# Patient Record
Sex: Female | Born: 1960 | Race: Asian | Hispanic: No | Marital: Married | Smoking: Never smoker
Health system: Southern US, Community
[De-identification: ages and names within clinical notes are randomized; demographics above are authoritative.]

## PROBLEM LIST (undated history)

## (undated) DIAGNOSIS — M546 Pain in thoracic spine: Secondary | ICD-10-CM

## (undated) DIAGNOSIS — R7303 Prediabetes: Secondary | ICD-10-CM

## (undated) DIAGNOSIS — G4733 Obstructive sleep apnea (adult) (pediatric): Secondary | ICD-10-CM

## (undated) DIAGNOSIS — G4730 Sleep apnea, unspecified: Secondary | ICD-10-CM

## (undated) DIAGNOSIS — N644 Mastodynia: Secondary | ICD-10-CM

## (undated) DIAGNOSIS — I10 Essential (primary) hypertension: Secondary | ICD-10-CM

## (undated) DIAGNOSIS — K219 Gastro-esophageal reflux disease without esophagitis: Secondary | ICD-10-CM

## (undated) DIAGNOSIS — E785 Hyperlipidemia, unspecified: Secondary | ICD-10-CM

## (undated) DIAGNOSIS — K760 Fatty (change of) liver, not elsewhere classified: Secondary | ICD-10-CM

## (undated) DIAGNOSIS — E663 Overweight: Secondary | ICD-10-CM

## (undated) HISTORY — DX: Mastodynia: N64.4

## (undated) HISTORY — DX: Fatty (change of) liver, not elsewhere classified: K76.0

## (undated) HISTORY — DX: Overweight: E66.3

## (undated) HISTORY — PX: BREAST CYST ASPIRATION: SHX578

## (undated) HISTORY — DX: Obstructive sleep apnea (adult) (pediatric): G47.33

## (undated) HISTORY — DX: Gastro-esophageal reflux disease without esophagitis: K21.9

## (undated) HISTORY — DX: Pain in thoracic spine: M54.6

## (undated) HISTORY — DX: Sleep apnea, unspecified: G47.30

## (undated) HISTORY — DX: Prediabetes: R73.03

## (undated) HISTORY — DX: Essential (primary) hypertension: I10

## (undated) HISTORY — DX: Hyperlipidemia, unspecified: E78.5

## (undated) HISTORY — PX: TUBAL LIGATION: SHX77

---

## 1997-09-26 HISTORY — PX: BREAST BIOPSY: SHX20

## 1999-04-19 ENCOUNTER — Other Ambulatory Visit: Admission: RE | Admit: 1999-04-19 | Discharge: 1999-04-19 | Payer: Self-pay | Admitting: Gynecology

## 1999-05-13 ENCOUNTER — Ambulatory Visit (HOSPITAL_BASED_OUTPATIENT_CLINIC_OR_DEPARTMENT_OTHER): Admission: RE | Admit: 1999-05-13 | Discharge: 1999-05-13 | Payer: Self-pay

## 2000-01-31 ENCOUNTER — Other Ambulatory Visit: Admission: RE | Admit: 2000-01-31 | Discharge: 2000-01-31 | Payer: Self-pay | Admitting: Gynecology

## 2001-04-03 ENCOUNTER — Encounter: Admission: RE | Admit: 2001-04-03 | Discharge: 2001-04-03 | Payer: Self-pay | Admitting: Gynecology

## 2001-04-03 ENCOUNTER — Other Ambulatory Visit: Admission: RE | Admit: 2001-04-03 | Discharge: 2001-04-03 | Payer: Self-pay | Admitting: Gynecology

## 2001-04-03 ENCOUNTER — Encounter: Payer: Self-pay | Admitting: Gynecology

## 2013-09-23 ENCOUNTER — Ambulatory Visit: Payer: Self-pay | Admitting: Family Medicine

## 2014-01-20 ENCOUNTER — Ambulatory Visit: Payer: Self-pay | Admitting: Family Medicine

## 2014-04-08 ENCOUNTER — Ambulatory Visit: Payer: Self-pay | Admitting: Family Medicine

## 2014-05-19 ENCOUNTER — Ambulatory Visit: Payer: Self-pay | Admitting: Family Medicine

## 2014-05-19 ENCOUNTER — Encounter: Payer: Self-pay | Admitting: *Deleted

## 2014-05-27 ENCOUNTER — Encounter: Payer: Self-pay | Admitting: Cardiovascular Disease

## 2014-05-27 ENCOUNTER — Encounter (INDEPENDENT_AMBULATORY_CARE_PROVIDER_SITE_OTHER): Payer: Self-pay

## 2014-05-27 ENCOUNTER — Ambulatory Visit (INDEPENDENT_AMBULATORY_CARE_PROVIDER_SITE_OTHER): Payer: BC Managed Care – PPO | Admitting: Cardiovascular Disease

## 2014-05-27 VITALS — BP 120/98 | HR 85 | Ht 62.0 in | Wt 173.0 lb

## 2014-05-27 DIAGNOSIS — R0602 Shortness of breath: Secondary | ICD-10-CM | POA: Insufficient documentation

## 2014-05-27 DIAGNOSIS — I10 Essential (primary) hypertension: Secondary | ICD-10-CM

## 2014-05-27 NOTE — Assessment & Plan Note (Signed)
Blood pressure is reasonably controlled on current medications. 

## 2014-05-27 NOTE — Progress Notes (Signed)
Primary care physician: Dr. Nadine Counts  HPI  This is a 53 year old female who is referred for evaluation of exertional dyspnea. She has no previous cardiac history. She has known history of hypertension and hyperlipidemia. No diabetes or tobacco use. There is no family history of premature coronary artery disease. Recently, she was helping her daughter move to college. She was helping her carrying stuff and was going upstairs. She had significant exertional dyspnea with palpitations without chest discomfort which is somewhat unusual for her. No orthopnea, PND or lower extremity edema. She does not exercise on a regular basis.  Allergies  Allergen Reactions  . Ciprofloxacin   . Lisinopril   . Migraine Formula [Aspirin-Acetaminophen-Caffeine]      Current Outpatient Prescriptions on File Prior to Visit  Medication Sig Dispense Refill  . aspirin 81 MG tablet Take 81 mg by mouth daily.      . cyclobenzaprine (FLEXERIL) 5 MG tablet Take 5-10 mg by mouth as needed for muscle spasms.      Marland Kitchen ibuprofen (ADVIL,MOTRIN) 800 MG tablet Take 800 mg by mouth every 8 (eight) hours as needed.      Marland Kitchen losartan-hydrochlorothiazide (HYZAAR) 100-12.5 MG per tablet Take 1 tablet by mouth daily.      . meloxicam (MOBIC) 15 MG tablet Take 15 mg by mouth daily.      Marland Kitchen omega-3 acid ethyl esters (LOVAZA) 1 G capsule Take 1 g by mouth daily.      Marland Kitchen omeprazole (PRILOSEC) 20 MG capsule Take 20 mg by mouth daily.      . simvastatin (ZOCOR) 40 MG tablet Take 40 mg by mouth daily.      . TURMERIC PO Take by mouth daily.       No current facility-administered medications on file prior to visit.     Past Medical History  Diagnosis Date  . Morbid obesity   . Esophageal reflux   . Postmenopausal bleeding   . Other and unspecified hyperlipidemia   . Unspecified essential hypertension   . Obstructive sleep apnea (adult) (pediatric)   . Sprain of ankle, unspecified site   . Edema   . Unspecified sinusitis (chronic)     . Pain in thoracic spine   . Mastodynia   . Unspecified sleep apnea   . Abdominal pain, right upper quadrant      Past Surgical History  Procedure Laterality Date  . Tubal ligation    . Breast biopsy       Family History  Problem Relation Age of Onset  . Family history unknown: Yes     History   Social History  . Marital Status: Married    Spouse Name: N/A    Number of Children: N/A  . Years of Education: N/A   Occupational History  . Not on file.   Social History Main Topics  . Smoking status: Never Smoker   . Smokeless tobacco: Not on file  . Alcohol Use: Yes     Comment: social  . Drug Use: No  . Sexual Activity: Not on file   Other Topics Concern  . Not on file   Social History Narrative  . No narrative on file     ROS A 10 point review of system was performed. It is negative other than that mentioned in the history of present illness.   PHYSICAL EXAM   BP 120/98  Pulse 85  Ht 5\' 2"  (1.575 m)  Wt 173 lb (78.472 kg)  BMI 31.63 kg/m2 Constitutional: She  is oriented to person, place, and time. She appears well-developed and well-nourished. No distress.  HENT: No nasal discharge.  Head: Normocephalic and atraumatic.  Eyes: Pupils are equal and round. No discharge.  Neck: Normal range of motion. Neck supple. No JVD present. No thyromegaly present.  Cardiovascular: Normal rate, regular rhythm, normal heart sounds. Exam reveals no gallop and no friction rub. No murmur heard.  Pulmonary/Chest: Effort normal and breath sounds normal. No stridor. No respiratory distress. She has no wheezes. She has no rales. She exhibits no tenderness.  Abdominal: Soft. Bowel sounds are normal. She exhibits no distension. There is no tenderness. There is no rebound and no guarding.  Musculoskeletal: Normal range of motion. She exhibits no edema and no tenderness.  Neurological: She is alert and oriented to person, place, and time. Coordination normal.  Skin: Skin is  warm and dry. No rash noted. She is not diaphoretic. No erythema. No pallor.  Psychiatric: She has a normal mood and affect. Her behavior is normal. Judgment and thought content normal.     [id number]  Rhythm  Low voltage in precordial leads.   -  Nonspecific T-abnormality.   ABNORMAL    ASSESSMENT AND PLAN

## 2014-05-27 NOTE — Patient Instructions (Addendum)
Your physician has requested that you have a stress echocardiogram. For further information please visit [email or web address]. Please follow instruction sheet as given. -eat a light meal  -wear comfortable clothing  -wear tennis shoes  - you can take your medication   Your physician recommends that you schedule a follow-up appointment in:  As needed with Dr. Fletcher Anon

## 2014-05-27 NOTE — Assessment & Plan Note (Addendum)
The patient has recent exertional dyspnea without chest discomfort. No signs of congestive heart failure. We should exclude angina equivalent. Thus, I requested a stress echocardiogram. Cardiac exam is normal. Baseline ECG is slightly abnormal with nonspecific T wave changes. The echocardiogram portion will examining her ejection fraction as well given that her symptoms include dyspnea .  The other  possibly etiology of her dyspnea could be physical deconditioning.

## 2014-06-13 ENCOUNTER — Telehealth: Payer: Self-pay | Admitting: *Deleted

## 2014-06-13 DIAGNOSIS — R0602 Shortness of breath: Secondary | ICD-10-CM

## 2014-06-13 NOTE — Telephone Encounter (Signed)
Message copied by Tracie Harrier on Fri Jun 13, 2014 10:40 AM ------      Message from: Benancio Deeds      Created: Fri Jun 13, 2014  7:35 AM      Regarding: echo       Happy Friday!            Can you change these orders to a 2D echo and GXT please?            Thank you!            Caryl Pina            ----- Message -----         From: Wellington Hampshire, MD         Sent: 06/12/2014   5:05 PM           To: Benancio Deeds            Yes change to Echo and GXT. Thanks.             ----- Message -----         From: Benancio Deeds         Sent: 06/11/2014  11:15 AM           To: Tracie Harrier, RN, Wellington Hampshire, MD            [facility] cross is not approving stress echo due to the patient's ability to walk on a treadmill.             Do you want to change this to a 2D echo and plain GXT?            You can call for an MD review if you would still like the stress echo at [phone] option , option 1 then option 2.                  Thank you,            Caryl Pina                   ------

## 2014-06-19 ENCOUNTER — Other Ambulatory Visit: Payer: BC Managed Care – PPO

## 2014-06-20 ENCOUNTER — Other Ambulatory Visit (INDEPENDENT_AMBULATORY_CARE_PROVIDER_SITE_OTHER): Payer: BC Managed Care – PPO

## 2014-06-20 ENCOUNTER — Ambulatory Visit (INDEPENDENT_AMBULATORY_CARE_PROVIDER_SITE_OTHER): Payer: BC Managed Care – PPO | Admitting: Cardiovascular Disease

## 2014-06-20 ENCOUNTER — Other Ambulatory Visit: Payer: Self-pay

## 2014-06-20 VITALS — BP 117/84 | HR 77 | Ht 62.0 in | Wt 171.0 lb

## 2014-06-20 DIAGNOSIS — R0602 Shortness of breath: Secondary | ICD-10-CM

## 2014-06-20 NOTE — Patient Instructions (Signed)
Normal stress test

## 2014-06-20 NOTE — Procedures (Signed)
   Treadmill Stress test  Indication: Shortness of breath.  Baseline Data:  Resting EKG shows NSR with rate of  77 bpm, no significant ST or T wave changes. Resting blood pressure of 117/84 mm Hg Stand bruce protocal was used.  Exercise Data:  Patient exercised for 6 min 0 sec,  Peak heart rate of 153 bpm.  This was 90 % of the maximum predicted heart rate. No symptoms of chest pain or lightheadedness were reported at peak stress or in recovery.  Peak Blood pressure recorded was 145/98 Maximal work level: 7 METs.  Heart rate at 3 minutes in recovery was 100 bpm. BP response: Normal HR response: Normal  EKG with Exercise: Sinus tachycardia with no significant ST changes.  FINAL IMPRESSION: Normal exercise stress test. No significant EKG changes concerning for ischemia. Average exercise tolerance.  Recommendation: Proceed with an echocardiogram.

## 2014-06-23 NOTE — Progress Notes (Signed)
GXT  

## 2014-09-16 ENCOUNTER — Ambulatory Visit: Payer: Self-pay | Admitting: Family Medicine

## 2014-09-19 ENCOUNTER — Emergency Department: Payer: Self-pay | Admitting: Internal Medicine

## 2014-10-21 ENCOUNTER — Ambulatory Visit: Payer: Self-pay | Admitting: Family Medicine

## 2015-01-27 ENCOUNTER — Ambulatory Visit
Admission: RE | Admit: 2015-01-27 | Discharge: 2015-01-27 | Disposition: A | Discharge status: Home / Self Care | Payer: BLUE CROSS/BLUE SHIELD | Source: Ambulatory Visit | Attending: Family Medicine | Admitting: Family Medicine

## 2015-01-27 ENCOUNTER — Other Ambulatory Visit: Payer: Self-pay | Admitting: Family Medicine

## 2015-01-27 DIAGNOSIS — M79605 Pain in left leg: Secondary | ICD-10-CM

## 2015-01-27 DIAGNOSIS — R609 Edema, unspecified: Secondary | ICD-10-CM

## 2015-01-27 DIAGNOSIS — M7989 Other specified soft tissue disorders: Secondary | ICD-10-CM | POA: Diagnosis present

## 2015-01-28 ENCOUNTER — Other Ambulatory Visit: Payer: Self-pay | Admitting: Family Medicine

## 2015-01-28 DIAGNOSIS — M7989 Other specified soft tissue disorders: Secondary | ICD-10-CM

## 2015-03-21 ENCOUNTER — Other Ambulatory Visit: Payer: Self-pay | Admitting: Family Medicine

## 2015-03-21 DIAGNOSIS — E785 Hyperlipidemia, unspecified: Secondary | ICD-10-CM

## 2015-03-25 ENCOUNTER — Other Ambulatory Visit: Payer: Self-pay | Admitting: Family Medicine

## 2015-03-25 DIAGNOSIS — I872 Venous insufficiency (chronic) (peripheral): Secondary | ICD-10-CM

## 2015-04-09 ENCOUNTER — Other Ambulatory Visit: Payer: Self-pay | Admitting: Family Medicine

## 2015-04-09 DIAGNOSIS — J3089 Other allergic rhinitis: Secondary | ICD-10-CM

## 2015-04-28 ENCOUNTER — Encounter: Payer: Self-pay | Admitting: Emergency Medicine

## 2015-04-28 ENCOUNTER — Emergency Department: Payer: Worker's Compensation

## 2015-04-28 ENCOUNTER — Emergency Department
Admission: EM | Admit: 2015-04-28 | Discharge: 2015-04-28 | Disposition: A | Discharge status: Home / Self Care | Payer: Worker's Compensation | Attending: Emergency Medicine | Admitting: Emergency Medicine

## 2015-04-28 DIAGNOSIS — Y9289 Other specified places as the place of occurrence of the external cause: Secondary | ICD-10-CM | POA: Insufficient documentation

## 2015-04-28 DIAGNOSIS — S060X0A Concussion without loss of consciousness, initial encounter: Secondary | ICD-10-CM | POA: Diagnosis not present

## 2015-04-28 DIAGNOSIS — Z7982 Long term (current) use of aspirin: Secondary | ICD-10-CM | POA: Insufficient documentation

## 2015-04-28 DIAGNOSIS — Y990 Civilian activity done for income or pay: Secondary | ICD-10-CM | POA: Diagnosis not present

## 2015-04-28 DIAGNOSIS — Y9389 Activity, other specified: Secondary | ICD-10-CM | POA: Insufficient documentation

## 2015-04-28 DIAGNOSIS — W01198A Fall on same level from slipping, tripping and stumbling with subsequent striking against other object, initial encounter: Secondary | ICD-10-CM | POA: Diagnosis not present

## 2015-04-28 DIAGNOSIS — Z79899 Other long term (current) drug therapy: Secondary | ICD-10-CM | POA: Diagnosis not present

## 2015-04-28 DIAGNOSIS — S0990XA Unspecified injury of head, initial encounter: Secondary | ICD-10-CM | POA: Diagnosis present

## 2015-04-28 MED ORDER — OXYCODONE-ACETAMINOPHEN 5-325 MG PO TABS
1.0000 | ORAL_TABLET | Freq: Once | ORAL | Status: AC
  Administered 2015-04-28: 1 via ORAL
  Filled 2015-04-28: qty 1

## 2015-04-28 NOTE — Discharge Instructions (Signed)
Concussion  A concussion, or closed-head injury, is a brain injury caused by a direct blow to the head or by a quick and sudden movement (jolt) of the head or neck. Concussions are usually not life-threatening. Even so, the effects of a concussion can be serious. If you have had a concussion before, you are more likely to experience concussion-like symptoms after a direct blow to the head.   CAUSES  · Direct blow to the head, such as from running into another player during a soccer game, being hit in a fight, or hitting your head on a hard surface.  · A jolt of the head or neck that causes the brain to move back and forth inside the skull, such as in a car crash.  SIGNS AND SYMPTOMS  The signs of a concussion can be hard to notice. Early on, they may be missed by you, family members, and health care providers. You may look fine but act or feel differently.  Symptoms are usually temporary, but they may last for days, weeks, or even longer. Some symptoms may appear right away while others may not show up for hours or days. Every head injury is different. Symptoms include:  · Mild to moderate headaches that will not go away.  · A feeling of pressure inside your head.  · Having more trouble than usual:  ¨ Learning or remembering things you have heard.  ¨ Answering questions.  ¨ Paying attention or concentrating.  ¨ Organizing daily tasks.  ¨ Making decisions and solving problems.  · Slowness in thinking, acting or reacting, speaking, or reading.  · Getting lost or being easily confused.  · Feeling tired all the time or lacking energy (fatigued).  · Feeling drowsy.  · Sleep disturbances.  ¨ Sleeping more than usual.  ¨ Sleeping less than usual.  ¨ Trouble falling asleep.  ¨ Trouble sleeping (insomnia).  · Loss of balance or feeling lightheaded or dizzy.  · Nausea or vomiting.  · Numbness or tingling.  · Increased sensitivity to:  ¨ Sounds.  ¨ Lights.  ¨ Distractions.  · Vision problems or eyes that tire  easily.  · Diminished sense of taste or smell.  · Ringing in the ears.  · Mood changes such as feeling sad or anxious.  · Becoming easily irritated or angry for little or no reason.  · Lack of motivation.  · Seeing or hearing things other people do not see or hear (hallucinations).  DIAGNOSIS  Your health care provider can usually diagnose a concussion based on a description of your injury and symptoms. He or she will ask whether you passed out (lost consciousness) and whether you are having trouble remembering events that happened right before and during your injury.  Your evaluation might include:  · A brain scan to look for signs of injury to the brain. Even if the test shows no injury, you may still have a concussion.  · Blood tests to be sure other problems are not present.  TREATMENT  · Concussions are usually treated in an emergency department, in urgent care, or at a clinic. You may need to stay in the hospital overnight for further treatment.  · Tell your health care provider if you are taking any medicines, including prescription medicines, over-the-counter medicines, and natural remedies. Some medicines, such as blood thinners (anticoagulants) and aspirin, may increase the chance of complications. Also tell your health care provider whether you have had alcohol or are taking illegal drugs. This information   may affect treatment.  · Your health care provider will send you home with important instructions to follow.  · How fast you will recover from a concussion depends on many factors. These factors include how severe your concussion is, what part of your brain was injured, your age, and how healthy you were before the concussion.  · Most people with mild injuries recover fully. Recovery can take time. In general, recovery is slower in older persons. Also, persons who have had a concussion in the past or have other medical problems may find that it takes longer to recover from their current injury.  HOME  CARE INSTRUCTIONS  General Instructions  · Carefully follow the directions your health care provider gave you.  · Only take over-the-counter or prescription medicines for pain, discomfort, or fever as directed by your health care provider.  · Take only those medicines that your health care provider has approved.  · Do not drink alcohol until your health care provider says you are well enough to do so. Alcohol and certain other drugs may slow your recovery and can put you at risk of further injury.  · If it is harder than usual to remember things, write them down.  · If you are easily distracted, try to do one thing at a time. For example, do not try to watch TV while fixing dinner.  · Talk with family members or close friends when making important decisions.  · Keep all follow-up appointments. Repeated evaluation of your symptoms is recommended for your recovery.  · Watch your symptoms and tell others to do the same. Complications sometimes occur after a concussion. Older adults with a brain injury may have a higher risk of serious complications, such as a blood clot on the brain.  · Tell your teachers, school nurse, school counselor, coach, athletic trainer, or work manager about your injury, symptoms, and restrictions. Tell them about what you can or cannot do. They should watch for:  ¨ Increased problems with attention or concentration.  ¨ Increased difficulty remembering or learning new information.  ¨ Increased time needed to complete tasks or assignments.  ¨ Increased irritability or decreased ability to cope with stress.  ¨ Increased symptoms.  · Rest. Rest helps the brain to heal. Make sure you:  ¨ Get plenty of sleep at night. Avoid staying up late at night.  ¨ Keep the same bedtime hours on weekends and weekdays.  ¨ Rest during the day. Take daytime naps or rest breaks when you feel tired.  · Limit activities that require a lot of thought or concentration. These include:  ¨ Doing homework or job-related  work.  ¨ Watching TV.  ¨ Working on the computer.  · Avoid any situation where there is potential for another head injury (football, hockey, soccer, basketball, martial arts, downhill snow sports and horseback riding). Your condition will get worse every time you experience a concussion. You should avoid these activities until you are evaluated by the appropriate follow-up health care providers.  Returning To Your Regular Activities  You will need to return to your normal activities slowly, not all at once. You must give your body and brain enough time for recovery.  · Do not return to sports or other athletic activities until your health care provider tells you it is safe to do so.  · Ask your health care provider when you can drive, ride a bicycle, or operate heavy machinery. Your ability to react may be slower after a   brain injury. Never do these activities if you are dizzy.  · Ask your health care provider about when you can return to work or school.  Preventing Another Concussion  It is very important to avoid another brain injury, especially before you have recovered. In rare cases, another injury can lead to permanent brain damage, brain swelling, or death. The risk of this is greatest during the first 7-10 days after a head injury. Avoid injuries by:  · Wearing a seat belt when riding in a car.  · Drinking alcohol only in moderation.  · Wearing a helmet when biking, skiing, skateboarding, skating, or doing similar activities.  · Avoiding activities that could lead to a second concussion, such as contact or recreational sports, until your health care provider says it is okay.  · Taking safety measures in your home.  ¨ Remove clutter and tripping hazards from floors and stairways.  ¨ Use grab bars in bathrooms and handrails by stairs.  ¨ Place non-slip mats on floors and in bathtubs.  ¨ Improve lighting in dim areas.  SEEK MEDICAL CARE IF:  · You have increased problems paying attention or  concentrating.  · You have increased difficulty remembering or learning new information.  · You need more time to complete tasks or assignments than before.  · You have increased irritability or decreased ability to cope with stress.  · You have more symptoms than before.  Seek medical care if you have any of the following symptoms for more than 2 weeks after your injury:  · Lasting (chronic) headaches.  · Dizziness or balance problems.  · Nausea.  · Vision problems.  · Increased sensitivity to noise or light.  · Depression or mood swings.  · Anxiety or irritability.  · Memory problems.  · Difficulty concentrating or paying attention.  · Sleep problems.  · Feeling tired all the time.  SEEK IMMEDIATE MEDICAL CARE IF:  · You have severe or worsening headaches. These may be a sign of a blood clot in the brain.  · You have weakness (even if only in one hand, leg, or part of the face).  · You have numbness.  · You have decreased coordination.  · You vomit repeatedly.  · You have increased sleepiness.  · One pupil is larger than the other.  · You have convulsions.  · You have slurred speech.  · You have increased confusion. This may be a sign of a blood clot in the brain.  · You have increased restlessness, agitation, or irritability.  · You are unable to recognize people or places.  · You have neck pain.  · It is difficult to wake you up.  · You have unusual behavior changes.  · You lose consciousness.  MAKE SURE YOU:  · Understand these instructions.  · Will watch your condition.  · Will get help right away if you are not doing well or get worse.  Document Released: 12/03/2003 Document Revised: 09/17/2013 Document Reviewed: 04/04/2013  [employer]® Patient Information ©2015 [employer]. This information is not intended to replace advice given to you by your health care provider. Make sure you discuss any questions you have with your health care provider.

## 2015-04-28 NOTE — ED Provider Notes (Signed)
[facility] Emergency Department Provider Note  ____________________________________________  Time seen: 8:10 PM  I have reviewed the triage vital signs and the nursing notes.   HISTORY  Chief Complaint Head Injury      HPI Regina Dunlap is a 54 y.o. female presents with history of being struck on the head today by a shelf at work. Patient admits to being briefly "dazed and dizzy" patient currently admits to 7 out of 10 posterior headache in the area where she was struck. Patient denies any weakness no numbness or paresthesia. Patient denies any gait instability or visual changes.     Past Medical History  Diagnosis Date  . Morbid obesity   . Esophageal reflux   . Postmenopausal bleeding   . Other and unspecified hyperlipidemia   . Obstructive sleep apnea (adult) (pediatric)   . Sprain of ankle, unspecified site   . Edema   . Unspecified sinusitis (chronic)   . Pain in thoracic spine   . Mastodynia   . Unspecified sleep apnea   . Abdominal pain, right upper quadrant   . Unspecified essential hypertension     Patient Active Problem List   Diagnosis Date Noted  . SOB (shortness of breath) 05/27/2014  . Unspecified essential hypertension     Past Surgical History  Procedure Laterality Date  . Tubal ligation    . Breast biopsy      Current Outpatient Rx  Name  Route  Sig  Dispense  Refill  . aspirin 81 MG tablet   Oral   Take 81 mg by mouth daily.         . cyclobenzaprine (FLEXERIL) 5 MG tablet   Oral   Take 5-10 mg by mouth as needed for muscle spasms.         . furosemide (LASIX) 20 MG tablet      TAKE 1 TABLET BY MOUTH ONCE A DAY AS NEEDED FOR SWELLING   30 tablet   3   . ibuprofen (ADVIL,MOTRIN) 800 MG tablet   Oral   Take 800 mg by mouth every 8 (eight) hours as needed.         . loratadine (CLARITIN) 10 MG tablet      TAKE 1 TABLET EVERY DAY   30 tablet   5   . losartan-hydrochlorothiazide (HYZAAR)  100-12.5 MG per tablet   Oral   Take 1 tablet by mouth daily.         . meloxicam (MOBIC) 15 MG tablet   Oral   Take 15 mg by mouth daily.         Marland Kitchen omega-3 acid ethyl esters (LOVAZA) 1 G capsule   Oral   Take 1 g by mouth daily.         Marland Kitchen omeprazole (PRILOSEC) 20 MG capsule   Oral   Take 20 mg by mouth daily.         . simvastatin (ZOCOR) 40 MG tablet   Oral   Take 1 tablet (40 mg total) by mouth at bedtime.   90 tablet   2   . TURMERIC PO   Oral   Take by mouth daily.           Allergies Ciprofloxacin; Lisinopril; and Migraine formula  History reviewed. No pertinent family history.  Social History History  Substance Use Topics  . Smoking status: Never Smoker   . Smokeless tobacco: Not on file  . Alcohol Use: Yes     Comment:  social    Review of Systems  Constitutional: Negative for fever. Eyes: Negative for visual changes. ENT: Negative for sore throat. Cardiovascular: Negative for chest pain. Respiratory: Negative for shortness of breath. Gastrointestinal: Negative for abdominal pain, vomiting and diarrhea. Genitourinary: Negative for dysuria. Musculoskeletal: Negative for back pain. Skin: Negative for rash. Neurological: Positive for headaches   10-point ROS otherwise negative.  ____________________________________________   PHYSICAL EXAM:  VITAL SIGNS: ED Triage Vitals  Enc Vitals Group     BP 04/28/15 1918 153/95 mmHg     Pulse Rate 04/28/15 1918 69     Resp 04/28/15 1918 18     Temp 04/28/15 1918 98.2 F (36.8 C)     Temp Source 04/28/15 1918 Oral     SpO2 04/28/15 1918 99 %     Weight 04/28/15 1918 162 lb (73.483 kg)     Height 04/28/15 1918 5\' 2"  (1.575 m)     Head Cir --      Peak Flow --      Pain Score 04/28/15 1919 7     Pain Loc --      Pain Edu? --      Excl. in [place]? --      Constitutional: Alert and oriented. Well appearing and in no distress. Eyes: Conjunctivae are normal. PERRL. Normal extraocular  movements. ENT   Head: Right occipital soft tissue swelling tenderness to palpation   Nose: No congestion/rhinnorhea.   Mouth/Throat: Mucous membranes are moist.   Neck: No stridor. Hematological/Lymphatic/Immunilogical: No cervical lymphadenopathy. Cardiovascular: Normal rate, regular rhythm. Normal and symmetric distal pulses are present in all extremities. No murmurs, rubs, or gallops. Respiratory: Normal respiratory effort without tachypnea nor retractions. Breath sounds are clear and equal bilaterally. No wheezes/rales/rhonchi. Gastrointestinal: Soft and nontender. No distention. There is no CVA tenderness. Genitourinary: deferred Musculoskeletal: Nontender with normal range of motion in all extremities. No joint effusions.  No lower extremity tenderness nor edema. Neurologic:  Normal speech and language. No gross focal neurologic deficits are appreciated. Speech is normal.  Skin:  Skin is warm, dry and intact. No rash noted. Right occipital tenderness to palpation with underlying swelling. Psychiatric: Mood and affect are normal. Speech and behavior are normal. Patient exhibits appropriate insight and judgment.   RADIOLOGY CT scan of the head revealed: CT Head Wo Contrast (Final result) Result time: 04/28/15 20:26:36   Final result by Rad Results In Interface (04/28/15 20:26:36)   Narrative:   CLINICAL DATA: 54 year old female with head injury, struck by shelf at work. Blunt trauma to the right occiput. Initial encounter.  EXAM: CT HEAD WITHOUT CONTRAST  TECHNIQUE: Contiguous axial images were obtained from the base of the skull through the vertex without intravenous contrast.  COMPARISON: None.  FINDINGS: Right posterior convexity scalp contusion/hematoma measuring up to 6 mm in thickness (series 3, image 53). Underlying calvarium appears intact. Incidental hyperostosis front talus. Visualized paranasal sinuses and mastoids are clear. Visualized orbit  soft tissues are within normal limits.  No midline shift, ventriculomegaly, mass effect, evidence of mass lesion, intracranial hemorrhage or evidence of cortically based acute infarction. Gray-white matter differentiation is within normal limits throughout the brain. Cerebral volume is within normal limits for age. No suspicious intracranial vascular hyperdensity.  IMPRESSION: 1. Scalp soft tissue injury without underlying fracture. 2. Normal noncontrast CT appearance of the brain.   Electronically Signed By: Genevie Ann M.D. On: 04/28/2015 20:26      INITIAL IMPRESSION / ASSESSMENT AND PLAN / ED COURSE  Pertinent labs &  imaging results that were available during my care of the patient were reviewed by me and considered in my medical decision making (see chart for details).  His physical exam consistent with possible concussion. However given apparent injury to the occipital region CT scan of the head will be performed. ____________________________________________   FINAL CLINICAL IMPRESSION(S) / ED DIAGNOSES  Final diagnoses:  Concussion, without loss of consciousness, initial encounter      Gregor Hams, MD 04/28/15 2039

## 2015-04-28 NOTE — ED Notes (Signed)
Pt arrived to the ED for complaints of having a head injury after being hit with a shelf at work. Pt denies LOC and any other symptoms. Pt is AOx4 in no apparent distress.

## 2015-04-28 NOTE — ED Notes (Signed)
Pt ambulating independently w/ steady gait on d/c in no acute distress, A&Ox4.D/c instructions reviewed w/ pt and family - pt and family deny any further questions or concerns at present.  

## 2015-04-28 NOTE — ED Notes (Signed)
Patient transported to CT 

## 2015-04-28 NOTE — ED Notes (Signed)
Pt was at work when a shelf fell of a wall and struck her on the back of the head - denies LOC - pt w/ hematoma to occiput, noted on palpation.

## 2015-04-30 ENCOUNTER — Ambulatory Visit: Payer: Self-pay | Admitting: Family Medicine

## 2015-08-23 ENCOUNTER — Other Ambulatory Visit: Payer: Self-pay | Admitting: Family Medicine

## 2015-09-07 ENCOUNTER — Other Ambulatory Visit: Payer: Self-pay | Admitting: Family Medicine

## 2015-09-16 ENCOUNTER — Ambulatory Visit
Admission: EM | Admit: 2015-09-16 | Discharge: 2015-09-16 | Disposition: A | Discharge status: Home / Self Care | Payer: BLUE CROSS/BLUE SHIELD | Source: Ambulatory Visit | Attending: Family Medicine | Admitting: Family Medicine

## 2015-09-16 ENCOUNTER — Ambulatory Visit (INDEPENDENT_AMBULATORY_CARE_PROVIDER_SITE_OTHER): Payer: BLUE CROSS/BLUE SHIELD | Admitting: Family Medicine

## 2015-09-16 ENCOUNTER — Ambulatory Visit
Admission: RE | Admit: 2015-09-16 | Discharge: 2015-09-16 | Disposition: A | Discharge status: Home / Self Care | Payer: BLUE CROSS/BLUE SHIELD | Source: Ambulatory Visit | Attending: Family Medicine | Admitting: Family Medicine

## 2015-09-16 ENCOUNTER — Encounter: Payer: Self-pay | Admitting: Family Medicine

## 2015-09-16 VITALS — BP 118/72 | HR 95 | Temp 98.2°F | Resp 16 | Wt 164.1 lb

## 2015-09-16 DIAGNOSIS — Z800 Family history of malignant neoplasm of digestive organs: Secondary | ICD-10-CM | POA: Diagnosis not present

## 2015-09-16 DIAGNOSIS — R079 Chest pain, unspecified: Secondary | ICD-10-CM

## 2015-09-16 DIAGNOSIS — I10 Essential (primary) hypertension: Secondary | ICD-10-CM | POA: Insufficient documentation

## 2015-09-16 DIAGNOSIS — J309 Allergic rhinitis, unspecified: Secondary | ICD-10-CM | POA: Insufficient documentation

## 2015-09-16 DIAGNOSIS — R0789 Other chest pain: Secondary | ICD-10-CM | POA: Diagnosis not present

## 2015-09-16 DIAGNOSIS — D1720 Benign lipomatous neoplasm of skin and subcutaneous tissue of unspecified limb: Secondary | ICD-10-CM | POA: Insufficient documentation

## 2015-09-16 MED ORDER — FLUTICASONE PROPIONATE 50 MCG/ACT NA SUSP: 2.0000 | Freq: Every day | NASAL | Status: DC

## 2015-09-16 NOTE — Progress Notes (Signed)
Name: Regina Dunlap   MRN: [id number]    DOB: 1961-09-23   Date:09/16/2015       Progress Note  Subjective  Chief Complaint  Chief Complaint  Patient presents with  . Chest Pain    HPI  Regina Dunlap is a 54 year old female with symptoms of generalized chest pain. Did run into something in her bathroom last week but this pain is more generalized. Comes and goes. She thinks it may be muscular but wanted to make sure it wasn't anything else. She did have Pneumonia last year around this time. No worsening SOB, cough, fevers, left sided chest pain, radiation of pain to left arm or jaw. Has had extensive cardiac work up 05/2014 with no significant findings. Cardiac risk factors include obesity, HTN, HLD. Is taking Simvastatin 40 mg a day, Losartan 100 mg a day, Chlorthalidone 50 mg a day, furosemide 20 mg only when having LE edema. Wearing compression stockings now which help.  Needs refill of flonase. Has a soft tissue mass on right forearm that can be sore sometimes.   Past Medical History  Diagnosis Date  . Morbid obesity ([facility])   . Esophageal reflux   . Postmenopausal bleeding   . Other and unspecified hyperlipidemia   . Obstructive sleep apnea (adult) (pediatric)   . Sprain of ankle, unspecified site   . Edema   . Unspecified sinusitis (chronic)   . Pain in thoracic spine   . Mastodynia   . Unspecified sleep apnea   . Abdominal pain, right upper quadrant   . Unspecified essential hypertension     Patient Active Problem List   Diagnosis Date Noted  . Chest pain of uncertain etiology [id number]  . Allergic rhinitis 09/16/2015  . Lipoma of arm 09/16/2015  . Family history of colon cancer 09/16/2015  . SOB (shortness of breath) 05/27/2014  . Hypertension goal BP (blood pressure) < 140/90     Social History  Substance Use Topics  . Smoking status: Never Smoker   . Smokeless tobacco: Not on file  . Alcohol Use: Yes     Comment: social     Current outpatient  prescriptions:  .  naproxen (NAPROSYN) 500 MG tablet, Take 500 mg by mouth 2 (two) times daily with a meal., Disp: , Rfl:  .  aspirin 81 MG tablet, Take 81 mg by mouth daily., Disp: , Rfl:  .  chlorthalidone (HYGROTON) 50 MG tablet, , Disp: , Rfl:  .  cyclobenzaprine (FLEXERIL) 5 MG tablet, Take 5-10 mg by mouth as needed for muscle spasms., Disp: , Rfl:  .  fluticasone (FLONASE) 50 MCG/ACT nasal spray, Place 2 sprays into both nostrils daily., Disp: 18 g, Rfl: 5 .  furosemide (LASIX) 20 MG tablet, TAKE 1 TABLET BY MOUTH ONCE A DAY AS NEEDED FOR SWELLING, Disp: 30 tablet, Rfl: 3 .  ibuprofen (ADVIL,MOTRIN) 800 MG tablet, Take 800 mg by mouth every 8 (eight) hours as needed., Disp: , Rfl:  .  loratadine (CLARITIN) 10 MG tablet, TAKE 1 TABLET EVERY DAY, Disp: 30 tablet, Rfl: 5 .  losartan (COZAAR) 100 MG tablet, , Disp: , Rfl:  .  omeprazole (PRILOSEC) 20 MG capsule, TAKE 1 CAPSULE BY MOUTH EVERY DAY, Disp: 90 capsule, Rfl: 2 .  simvastatin (ZOCOR) 40 MG tablet, Take 1 tablet (40 mg total) by mouth at bedtime., Disp: 90 tablet, Rfl: 2 .  TURMERIC PO, Take by mouth daily., Disp: , Rfl:   Past Surgical History  Procedure Laterality Date  .  Tubal ligation    . Breast biopsy      History reviewed. No pertinent family history.  Allergies  Allergen Reactions  . Ciprofloxacin   . Lisinopril   . Migraine Formula [Aspirin-Acetaminophen-Caffeine]      Review of Systems  CONSTITUTIONAL: No significant weight changes, fever, chills, weakness or fatigue.  HEENT:  - Eyes: No visual changes.  - Ears: No auditory changes. No pain.  - Nose: No sneezing, congestion, runny nose. - Throat: No sore throat. No changes in swallowing. SKIN: No rash or itching.  CARDIOVASCULAR: Yes chest pain. No chest pressure or chest discomfort. No palpitations or edema.  RESPIRATORY: No shortness of breath, cough or sputum.  PSYCHIATRIC: No change in mood. No change in sleep pattern.  ENDOCRINOLOGIC: No reports of  sweating, cold or heat intolerance. No polyuria or polydipsia.     Objective  BP 118/72 mmHg  Pulse 95  Temp(Src) 98.2 F (36.8 C) (Oral)  Resp 16  Wt 164 lb 1.6 oz (74.435 kg)  SpO2 97% Body mass index is 30.01 kg/(m^2).  Physical Exam  Constitutional: Patient is overweight and well-nourished. In no distress.   Cardiovascular: Normal rate, regular rhythm and normal heart sounds.  No murmur heard.  Pulmonary/Chest: No reproducible chest wall pain. Effort normal and breath sounds normal. No respiratory distress. Musculoskeletal: Normal range of motion bilateral UE and LE, no joint effusions. Neurological: CN II-XII grossly intact with no focal deficits. Alert and oriented to person, place, and time. Coordination, balance, strength, speech and gait are normal.  Skin: Skin is warm and dry. No rash noted. No erythema. Right arm near cubital fossa soft mass under subcutaneous tissue, mildly tender but not inflamed.  Psychiatric: Patient has a normal mood and affect. Behavior is normal in office today. Judgment and thought content normal in office today.   Assessment & Plan  1. Chest pain of uncertain etiology Likely pectoralis muscle strain, instructed on home exercises. EKG unchanged from previous in comparison. Will get CXR to rule out infiltrates.   - EKG 12-Lead - DG Chest 2 View; Future  2. Hypertension goal BP (blood pressure) < 140/90 Well controled on current regimen.  - DG Chest 2 View; Future  3. Allergic rhinitis, unspecified allergic rhinitis type Refilled.   - fluticasone (FLONASE) 50 MCG/ACT nasal spray; Place 2 sprays into both nostrils daily.  Dispense: 18 g; Refill: 5  4. Lipoma of arm Monitor for growth.   5. Family history of colon cancer She has had her C-scope, some diverticula found otherwise benign.

## 2015-09-20 ENCOUNTER — Other Ambulatory Visit: Payer: Self-pay | Admitting: Family Medicine

## 2015-10-13 ENCOUNTER — Other Ambulatory Visit: Payer: Self-pay | Admitting: Family Medicine

## 2015-10-13 ENCOUNTER — Ambulatory Visit (INDEPENDENT_AMBULATORY_CARE_PROVIDER_SITE_OTHER): Payer: BLUE CROSS/BLUE SHIELD | Admitting: Family Medicine

## 2015-10-13 ENCOUNTER — Encounter: Payer: Self-pay | Admitting: Family Medicine

## 2015-10-13 VITALS — BP 118/78 | HR 73 | Temp 98.4°F | Resp 16 | Ht 62.0 in | Wt 166.3 lb

## 2015-10-13 DIAGNOSIS — R600 Localized edema: Secondary | ICD-10-CM | POA: Insufficient documentation

## 2015-10-13 DIAGNOSIS — Z0000 Encounter for general adult medical examination without abnormal findings: Secondary | ICD-10-CM

## 2015-10-13 DIAGNOSIS — Z113 Encounter for screening for infections with a predominantly sexual mode of transmission: Secondary | ICD-10-CM | POA: Diagnosis not present

## 2015-10-13 DIAGNOSIS — K219 Gastro-esophageal reflux disease without esophagitis: Secondary | ICD-10-CM | POA: Insufficient documentation

## 2015-10-13 DIAGNOSIS — I10 Essential (primary) hypertension: Secondary | ICD-10-CM

## 2015-10-13 DIAGNOSIS — Z136 Encounter for screening for cardiovascular disorders: Secondary | ICD-10-CM | POA: Diagnosis not present

## 2015-10-13 DIAGNOSIS — E782 Mixed hyperlipidemia: Secondary | ICD-10-CM | POA: Insufficient documentation

## 2015-10-13 DIAGNOSIS — Z1231 Encounter for screening mammogram for malignant neoplasm of breast: Secondary | ICD-10-CM | POA: Insufficient documentation

## 2015-10-13 DIAGNOSIS — E785 Hyperlipidemia, unspecified: Secondary | ICD-10-CM

## 2015-10-13 DIAGNOSIS — IMO0001 Reserved for inherently not codable concepts without codable children: Secondary | ICD-10-CM

## 2015-10-13 NOTE — Progress Notes (Signed)
Name: Regina Dunlap   MRN: [id number]    DOB: Aug 22, 1961   Date:10/13/2015       Progress Note  Subjective  Chief Complaint  Chief Complaint  Patient presents with  . Annual Exam    goes the the gyn (pap)    HPI  Patient is here today for a Complete Female Physical Exam:  The patient has no unusual complaints but does want to mention a few episodes of left sided headache that was tender to touch then resolved with use of NSAID. Not associated with visual changes, focal neurological deficits, nausea.  PAP done through Gynecologist, normal April 2016. Overall feels healthy. Diet is well balanced. In general does not exercise regularly, inconsistent. Sees dentist regularly and addresses vision concerns with ophthalmologist if applicable. In regards to sexual activity the patient is currently sexually active. Currently is not concerned about exposure to any STDs. Would like complete blood work.   Chronic medical conditions include HTN, HLD, LE edema with complete unremarkable cardiac work up, responsive to compression stocking use (needs RX for new pair) and prn diuretic.   Past Medical History  Diagnosis Date  . Morbid obesity ([place])   . Esophageal reflux   . Postmenopausal bleeding   . Other and unspecified hyperlipidemia   . Obstructive sleep apnea (adult) (pediatric)   . Sprain of ankle, unspecified site   . Edema   . Unspecified sinusitis (chronic)   . Pain in thoracic spine   . Mastodynia   . Unspecified sleep apnea   . Abdominal pain, right upper quadrant   . Unspecified essential hypertension     Past Surgical History  Procedure Laterality Date  . Tubal ligation    . Breast biopsy      No family history on file.  Social History   Social History  . Marital Status: Married    Spouse Name: N/A  . Number of Children: N/A  . Years of Education: N/A   Occupational History  . Not on file.   Social History Main Topics  . Smoking status: Never Smoker   .  Smokeless tobacco: Not on file  . Alcohol Use: Yes     Comment: social  . Drug Use: No  . Sexual Activity: Not on file   Other Topics Concern  . Not on file   Social History Narrative     Current outpatient prescriptions:  .  omega-3 acid ethyl esters (LOVAZA) 1 g capsule, Take 1 g by mouth 2 (two) times daily., Disp: , Rfl:  .  aspirin 81 MG tablet, Take 81 mg by mouth daily., Disp: , Rfl:  .  chlorthalidone (HYGROTON) 50 MG tablet, TAKE 1 TABLET BY MOUTH DAILY (STOP LOSARTAN-HCTZ), Disp: 90 tablet, Rfl: 1 .  fluticasone (FLONASE) 50 MCG/ACT nasal spray, Place 2 sprays into both nostrils daily., Disp: 18 g, Rfl: 5 .  furosemide (LASIX) 20 MG tablet, TAKE 1 TABLET BY MOUTH ONCE A DAY AS NEEDED FOR SWELLING, Disp: 30 tablet, Rfl: 3 .  ibuprofen (ADVIL,MOTRIN) 800 MG tablet, Take 800 mg by mouth every 8 (eight) hours as needed., Disp: , Rfl:  .  loratadine (CLARITIN) 10 MG tablet, TAKE 1 TABLET EVERY DAY, Disp: 30 tablet, Rfl: 5 .  losartan (COZAAR) 100 MG tablet, , Disp: , Rfl:  .  naproxen (NAPROSYN) 500 MG tablet, Take 500 mg by mouth 2 (two) times daily with a meal., Disp: , Rfl:  .  omeprazole (PRILOSEC) 20 MG capsule, TAKE 1 CAPSULE  BY MOUTH EVERY DAY, Disp: 90 capsule, Rfl: 2 .  simvastatin (ZOCOR) 40 MG tablet, Take 1 tablet (40 mg total) by mouth at bedtime., Disp: 90 tablet, Rfl: 2 .  TURMERIC PO, Take by mouth daily., Disp: , Rfl:   Allergies  Allergen Reactions  . Ciprofloxacin   . Lisinopril   . Migraine Formula [Aspirin-Acetaminophen-Caffeine]     ROS  CONSTITUTIONAL: No significant weight changes, fever, chills, weakness or fatigue.  HEENT:  - Eyes: No visual changes.  - Ears: No auditory changes. No pain.  - Nose: No sneezing, congestion, runny nose. - Throat: No sore throat. No changes in swallowing. SKIN: No rash or itching.  CARDIOVASCULAR: No chest pain, chest pressure or chest discomfort. No palpitations or edema.  RESPIRATORY: No shortness of breath,  cough or sputum.  GASTROINTESTINAL: No anorexia, nausea, vomiting. No changes in bowel habits. No abdominal pain or blood.  GENITOURINARY: No dysuria. No frequency. No discharge.  NEUROLOGICAL: No headache, dizziness, syncope, paralysis, ataxia, numbness or tingling in the extremities. No memory changes. No change in bowel or bladder control.  MUSCULOSKELETAL: No joint pain. No muscle pain. HEMATOLOGIC: No anemia, bleeding or bruising.  LYMPHATICS: No enlarged lymph nodes.  PSYCHIATRIC: No change in mood. No change in sleep pattern.  ENDOCRINOLOGIC: No reports of sweating, cold or heat intolerance. No polyuria or polydipsia.   Objective  Filed Vitals:   10/13/15 0824  BP: 118/78  Pulse: 73  Temp: 98.4 F (36.9 C)  TempSrc: Oral  Resp: 16  Height: 5\' 2"  (1.575 m)  Weight: 166 lb 4.8 oz (75.433 kg)  SpO2: 96%   Body mass index is 30.41 kg/(m^2).  Depression screen PHQ 2/9 09/16/2015  Decreased Interest 0  Down, Depressed, Hopeless 0  PHQ - 2 Score 0     Physical Exam  Constitutional: Patient is overweight and well-nourished. In no distress.  HEENT:  - Head: Normocephalic and atraumatic.  - Ears: Bilateral TMs gray, no erythema or effusion - Nose: Nasal mucosa moist - Mouth/Throat: Oropharynx is clear and moist. No tonsillar hypertrophy or erythema. No post nasal drainage.  - Eyes: Conjunctivae clear, EOM movements normal. PERRLA. No scleral icterus.  Neck: Normal range of motion. Neck supple. No JVD present. No thyromegaly present.  Cardiovascular: Normal rate, regular rhythm and normal heart sounds.  No murmur heard.  Pulmonary/Chest: Effort normal and breath sounds normal. No respiratory distress. Abdominal: Soft. Bowel sounds are normal, no distension. There is no tenderness. no masses BREAST: Bilateral breast exam normal with no masses, skin changes or nipple discharge Musculoskeletal: Normal range of motion bilateral UE and LE, no joint effusions. Peripheral  vascular: Bilateral LE no edema. Neurological: CN II-XII grossly intact with no focal deficits. Alert and oriented to person, place, and time. Coordination, balance, strength, speech and gait are normal.  Skin: Skin is warm and dry. No rash noted. No erythema.  Psychiatric: Patient has a normal mood and affect. Behavior is normal in office today. Judgment and thought content normal in office today.   Assessment & Plan  1. Annual physical exam Discussed in detail all recommended preventative measures appropriate for age and gender now and in the future.   2. Encounter for screening mammogram for malignant neoplasm of breast  - MM Digital Screening; Future  3. Hypertension goal BP (blood pressure) < 140/90 Well controled on current medication.  - CBC with Differential/Platelet - Comprehensive metabolic panel - Lipid panel - TSH - Hemoglobin A1c  4. Encounter for cholesteral screening  for cardiovascular disease  - Lipid panel  5. Hyperlipidemia LDL goal <100 Continue statin therapy.  6. Screening for STD (sexually transmitted disease)  - HIV antibody - Hepatitis C antibody  7. Bilateral edema of lower extremity Hand written RX for compression stockings provided.

## 2015-10-14 LAB — HIV ANTIBODY (ROUTINE TESTING W REFLEX): HIV Screen 4th Generation wRfx: NONREACTIVE

## 2015-10-14 LAB — CBC WITH DIFFERENTIAL/PLATELET
BASOS: 1 %
Basophils Absolute: 0 10*3/uL (ref 0.0–0.2)
EOS (ABSOLUTE): 0.2 10*3/uL (ref 0.0–0.4)
EOS: 4 %
Hematocrit: 38.6 % (ref 34.0–46.6)
Hemoglobin: 13.1 g/dL (ref 11.1–15.9)
IMMATURE GRANS (ABS): 0 10*3/uL (ref 0.0–0.1)
IMMATURE GRANULOCYTES: 0 %
LYMPHS: 44 %
Lymphocytes Absolute: 2.4 10*3/uL (ref 0.7–3.1)
MCH: 29.4 pg (ref 26.6–33.0)
MCHC: 33.9 g/dL (ref 31.5–35.7)
MCV: 87 fL (ref 79–97)
Monocytes Absolute: 0.2 10*3/uL (ref 0.1–0.9)
Monocytes: 4 %
NEUTROS PCT: 47 %
Neutrophils Absolute: 2.6 10*3/uL (ref 1.4–7.0)
PLATELETS: 239 10*3/uL (ref 150–379)
RBC: 4.45 x10E6/uL (ref 3.77–5.28)
RDW: 14 % (ref 12.3–15.4)
WBC: 5.5 10*3/uL (ref 3.4–10.8)

## 2015-10-14 LAB — COMPREHENSIVE METABOLIC PANEL
A/G RATIO: 1.7 (ref 1.1–2.5)
ALT: 21 IU/L (ref 0–32)
AST: 22 IU/L (ref 0–40)
Albumin: 4.3 g/dL (ref 3.5–5.5)
Alkaline Phosphatase: 102 IU/L (ref 39–117)
BUN/Creatinine Ratio: 22 (ref 9–23)
BUN: 20 mg/dL (ref 6–24)
Bilirubin Total: 0.3 mg/dL (ref 0.0–1.2)
CALCIUM: 9.4 mg/dL (ref 8.7–10.2)
CO2: 25 mmol/L (ref 18–29)
CREATININE: 0.9 mg/dL (ref 0.57–1.00)
Chloride: 100 mmol/L (ref 96–106)
GFR, EST AFRICAN AMERICAN: 85 mL/min/{1.73_m2} (ref >59)
GFR, EST NON AFRICAN AMERICAN: 74 mL/min/{1.73_m2} (ref >59)
GLUCOSE: 103 mg/dL — AB (ref 65–99)
Globulin, Total: 2.5 g/dL (ref 1.5–4.5)
Potassium: 3.6 mmol/L (ref 3.5–5.2)
Sodium: 145 mmol/L — ABNORMAL HIGH (ref 134–144)
TOTAL PROTEIN: 6.8 g/dL (ref 6.0–8.5)

## 2015-10-14 LAB — HEMOGLOBIN A1C
Est. average glucose Bld gHb Est-mCnc: 128 mg/dL
HEMOGLOBIN A1C: 6.1 % — AB (ref 4.8–5.6)

## 2015-10-14 LAB — TSH: TSH: 2.59 u[IU]/mL (ref 0.450–4.500)

## 2015-10-14 LAB — LIPID PANEL
CHOL/HDL RATIO: 3.4 ratio (ref 0.0–4.4)
Cholesterol, Total: 181 mg/dL (ref 100–199)
HDL: 54 mg/dL (ref >39)
LDL CALC: 91 mg/dL (ref 0–99)
TRIGLYCERIDES: 181 mg/dL — AB (ref 0–149)
VLDL CHOLESTEROL CAL: 36 mg/dL (ref 5–40)

## 2015-10-16 ENCOUNTER — Other Ambulatory Visit: Payer: Self-pay | Admitting: Family Medicine

## 2015-10-16 DIAGNOSIS — E782 Mixed hyperlipidemia: Secondary | ICD-10-CM

## 2015-10-16 MED ORDER — OMEGA-3-ACID ETHYL ESTERS 1 G PO CAPS: 1.0000 g | ORAL_CAPSULE | Freq: Two times a day (BID) | ORAL | Status: DC

## 2015-10-29 ENCOUNTER — Ambulatory Visit
Admission: RE | Admit: 2015-10-29 | Discharge: 2015-10-29 | Disposition: A | Discharge status: Home / Self Care | Payer: BLUE CROSS/BLUE SHIELD | Source: Ambulatory Visit | Attending: Family Medicine | Admitting: Family Medicine

## 2015-10-29 DIAGNOSIS — Z1231 Encounter for screening mammogram for malignant neoplasm of breast: Secondary | ICD-10-CM | POA: Diagnosis present

## 2015-11-22 ENCOUNTER — Other Ambulatory Visit: Payer: Self-pay | Admitting: Family Medicine

## 2015-11-26 ENCOUNTER — Other Ambulatory Visit: Payer: Self-pay | Admitting: Family Medicine

## 2015-12-21 ENCOUNTER — Other Ambulatory Visit: Payer: Self-pay

## 2015-12-21 DIAGNOSIS — E785 Hyperlipidemia, unspecified: Secondary | ICD-10-CM

## 2015-12-21 MED ORDER — SIMVASTATIN 40 MG PO TABS
40.0000 mg | ORAL_TABLET | Freq: Every day | ORAL | Status: DC
Start: 2015-12-21 — End: 2016-01-18

## 2015-12-21 NOTE — Telephone Encounter (Signed)
Refill request was sent to Dr. Krichna Sowles for approval and submission.  

## 2015-12-23 ENCOUNTER — Other Ambulatory Visit: Payer: Self-pay

## 2015-12-23 MED ORDER — FUROSEMIDE 20 MG PO TABS: ORAL_TABLET | ORAL | Status: DC

## 2015-12-23 NOTE — Telephone Encounter (Signed)
Left message with husband to return call

## 2015-12-24 ENCOUNTER — Other Ambulatory Visit: Payer: Self-pay

## 2015-12-24 NOTE — Telephone Encounter (Signed)
Already addressed by colleague yesterday

## 2016-01-18 ENCOUNTER — Other Ambulatory Visit: Payer: Self-pay | Admitting: Family Medicine

## 2016-01-18 NOTE — Telephone Encounter (Signed)
Jan 2017 lipid and sgpt reviewed; okay for statin refill

## 2016-01-18 NOTE — Telephone Encounter (Signed)
Patient requesting refill. 

## 2016-01-23 ENCOUNTER — Other Ambulatory Visit: Payer: Self-pay | Admitting: Family Medicine

## 2016-03-16 ENCOUNTER — Other Ambulatory Visit: Payer: Self-pay

## 2016-03-17 ENCOUNTER — Other Ambulatory Visit: Payer: Self-pay

## 2016-03-17 MED ORDER — CHLORTHALIDONE 50 MG PO TABS: 50.0000 mg | ORAL_TABLET | Freq: Every day | ORAL | Status: DC

## 2016-03-17 NOTE — Telephone Encounter (Signed)
Patient will be due for visit on or after July 18th, so I'll suggest she make that appt now Come fasting labs labs (high chol, prediabetes, HTN) I'll refill med and look forward to meeting her in mid to late July

## 2016-03-18 NOTE — Telephone Encounter (Signed)
Appointment made for July 3. I informed patient that you wanted her to come in mid July but she needed to see you sooner for something different.

## 2016-03-28 ENCOUNTER — Ambulatory Visit (INDEPENDENT_AMBULATORY_CARE_PROVIDER_SITE_OTHER): Payer: BLUE CROSS/BLUE SHIELD | Admitting: Family Medicine

## 2016-03-28 ENCOUNTER — Encounter: Payer: Self-pay | Admitting: Family Medicine

## 2016-03-28 VITALS — BP 120/72 | HR 75 | Temp 98.8°F | Resp 16 | Ht 62.0 in | Wt 170.1 lb

## 2016-03-28 DIAGNOSIS — I10 Essential (primary) hypertension: Secondary | ICD-10-CM | POA: Diagnosis not present

## 2016-03-28 DIAGNOSIS — R413 Other amnesia: Secondary | ICD-10-CM | POA: Insufficient documentation

## 2016-03-28 DIAGNOSIS — R438 Other disturbances of smell and taste: Secondary | ICD-10-CM

## 2016-03-28 DIAGNOSIS — R7303 Prediabetes: Secondary | ICD-10-CM | POA: Diagnosis not present

## 2016-03-28 DIAGNOSIS — Z5181 Encounter for therapeutic drug level monitoring: Secondary | ICD-10-CM | POA: Diagnosis not present

## 2016-03-28 DIAGNOSIS — R109 Unspecified abdominal pain: Secondary | ICD-10-CM

## 2016-03-28 DIAGNOSIS — R635 Abnormal weight gain: Secondary | ICD-10-CM | POA: Diagnosis not present

## 2016-03-28 DIAGNOSIS — E782 Mixed hyperlipidemia: Secondary | ICD-10-CM

## 2016-03-28 HISTORY — DX: Prediabetes: R73.03

## 2016-03-28 MED ORDER — HYOSCYAMINE SULFATE 0.125 MG PO TABS: 0.1250 mg | ORAL_TABLET | Freq: Four times a day (QID) | ORAL | Status: DC | PRN

## 2016-03-28 NOTE — Assessment & Plan Note (Signed)
Well-controlled; try DASH guidelines 

## 2016-03-28 NOTE — Patient Instructions (Addendum)
Try the new medicine for the spasms; just give me a call later about how the medicine is working  12 Ways to [place]  ?Anxiety is normal human sensation. It is what helped our ancestors survive the pitfalls of the wilderness. Anxiety is defined as experiencing worry or nervousness about an imminent event or something with an uncertain outcome. It is a feeling experienced by most people at some point in their lives. Anxiety can be triggered by a very personal issue, such as the illness of a loved one, or an event of global proportions, such as a refugee crisis. Some of the symptoms of anxiety are:  Feeling restless.  Having a feeling of impending danger.  Increased heart rate.  Rapid breathing. Sweating.  Shaking.  Weakness or feeling tired.  Difficulty concentrating on anything except the current worry.  Insomnia.  Stomach or bowel problems. What can we do about anxiety we may be feeling? There are many techniques to help manage stress and relax. Here are 12 ways you can reduce your anxiety almost immediately: 1. Turn off the constant feed of information. Take a social media sabbatical. Studies have shown that social media directly contributes to social anxiety.  2. Monitor your television viewing habits. Are you watching shows that are also contributing to your anxiety, such as 24-hour news stations? Try watching something else, or better yet, nothing at all. Instead, listen to music, read an inspirational book or practice a hobby. 3. Eat nutritious meals. Also, don't skip meals and keep healthful snacks on hand. Hunger and poor diet contributes to feeling anxious. 4. Sleep. Sleeping on a regular schedule for at least seven to eight hours a night will do wonders for your outlook when you are awake. 5. Exercise. Regular exercise will help rid your body of that anxious energy and help you get more restful sleep. 6. Try deep (diaphragmatic) breathing. Inhale slowly through your nose for five  seconds and exhale through your mouth. 7. Practice acceptance and gratitude. When anxiety hits, accept that there are things out of your control that shouldn't be of immediate concern.  8. Seek out humor. When anxiety strikes, watch a funny video, read jokes or call a friend who makes you laugh. Laughter is healing for our bodies and releases endorphins that are calming. 9. Stay positive. Take the effort to replace negative thoughts with positive ones. Try to see a stressful situation in a positive light. Try to come up with solutions rather than dwelling on the problem. 10. Figure out what triggers your anxiety. Keep a journal and make note of anxious moments and the events surrounding them. This will help you identify triggers you can avoid or even eliminate. 11. Talk to someone. Let a trusted friend, family member or even trained professional know that you are feeling overwhelmed and anxious. Verbalize what you are feeling and why.  12. Volunteer. If your anxiety is triggered by a crisis on a large scale, become an advocate and work to resolve the problem that is causing you unease. Anxiety is often unwelcome and can become overwhelming. If not kept in check, it can become a disorder that could require medical treatment. However, if you take the time to care for yourself and avoid the triggers that make you anxious, you will be able to find moments of relaxation and clarity that make your life much more enjoyable.   Have fasting labs done when you return, on or after July 18th  Try to limit saturated fats  in your diet (bologna, hot dogs, barbeque, cheeseburgers, hamburgers, steak, bacon, sausage, cheese, etc.) and get more fresh fruits, vegetables, and whole grains  Check out the information at familydoctor.org entitled "Nutrition for Weight Loss: What You Need to Know about Fad Diets" Try to lose between 1-2 pounds per week by taking in fewer calories and burning off more calories You can succeed  by limiting portions, limiting foods dense in calories and fat, becoming more active, and drinking 8 glasses of water a day (64 ounces) Don't skip meals, especially breakfast, as skipping meals may alter your metabolism Do not use over-the-counter weight loss pills or gimmicks that claim rapid weight loss A healthy BMI (or body mass index) is between 18.5 and 24.9 You can calculate your ideal BMI at the [place] website ClubMonetize.fr  Your goal blood pressure is less than 140 mmHg on top. Try to follow the DASH guidelines (DASH stands for Dietary Approaches to Stop Hypertension) Try to limit the sodium in your diet.  Ideally, consume less than 1.5 grams (less than 1,500mg ) per day. Do not add salt when cooking or at the table.  Check the sodium amount on labels when shopping, and choose items lower in sodium when given a choice. Avoid or limit foods that already contain a lot of sodium. Eat a diet rich in fruits and vegetables and whole grains.  DASH Eating Plan DASH stands for "Dietary Approaches to Stop Hypertension." The DASH eating plan is a healthy eating plan that has been shown to reduce high blood pressure (hypertension). Additional health benefits may include reducing the risk of type 2 diabetes mellitus, heart disease, and stroke. The DASH eating plan may also help with weight loss. WHAT DO I NEED TO KNOW ABOUT THE DASH EATING PLAN? For the DASH eating plan, you will follow these general guidelines:  Choose foods with a percent daily value for sodium of less than 5% (as listed on the food label).  Use salt-free seasonings or herbs instead of table salt or sea salt.  Check with your health care provider or pharmacist before using salt substitutes.  Eat lower-sodium products, often labeled as "lower sodium" or "no salt added."  Eat fresh foods.  Eat more vegetables, fruits, and low-fat dairy products.  Choose whole grains. Look for  the word "whole" as the first word in the ingredient list.  Choose fish and skinless chicken or [place] more often than red meat. Limit fish, poultry, and meat to 6 oz (170 g) each day.  Limit sweets, desserts, sugars, and sugary drinks.  Choose heart-healthy fats.  Limit cheese to 1 oz (28 g) per day.  Eat more home-cooked food and less restaurant, buffet, and fast food.  Limit fried foods.  Cook foods using methods other than frying.  Limit canned vegetables. If you do use them, rinse them well to decrease the sodium.  When eating at a restaurant, ask that your food be prepared with less salt, or no salt if possible. WHAT FOODS CAN I EAT? Seek help from a dietitian for individual calorie needs. Grains Whole grain or whole wheat bread. Brown rice. Whole grain or whole wheat pasta. Quinoa, bulgur, and whole grain cereals. Low-sodium cereals. Corn or whole wheat flour tortillas. Whole grain cornbread. Whole grain crackers. Low-sodium crackers. Vegetables Fresh or frozen vegetables (raw, steamed, roasted, or grilled). Low-sodium or reduced-sodium tomato and vegetable juices. Low-sodium or reduced-sodium tomato sauce and paste. Low-sodium or reduced-sodium canned vegetables.  Fruits All fresh, canned (in natural juice), or  frozen fruits. Meat and Other Protein Products Ground beef (85% or leaner), grass-fed beef, or beef trimmed of fat. Skinless chicken or [place]. Ground chicken or [place]. Pork trimmed of fat. All fish and seafood. Eggs. Dried beans, peas, or lentils. Unsalted nuts and seeds. Unsalted canned beans. Dairy Low-fat dairy products, such as skim or 1% milk, 2% or reduced-fat cheeses, low-fat ricotta or cottage cheese, or plain low-fat yogurt. Low-sodium or reduced-sodium cheeses. Fats and Oils Tub margarines without trans fats. Light or reduced-fat mayonnaise and salad dressings (reduced sodium). Avocado. Safflower, olive, or canola oils. Natural peanut or almond  butter. Other Unsalted popcorn and pretzels. The items listed above may not be a complete list of recommended foods or beverages. Contact your dietitian for more options. WHAT FOODS ARE NOT RECOMMENDED? Grains White bread. White pasta. White rice. Refined cornbread. Bagels and croissants. Crackers that contain trans fat. Vegetables Creamed or fried vegetables. Vegetables in a cheese sauce. Regular canned vegetables. Regular canned tomato sauce and paste. Regular tomato and vegetable juices. Fruits Dried fruits. Canned fruit in light or heavy syrup. Fruit juice. Meat and Other Protein Products Fatty cuts of meat. Ribs, chicken wings, bacon, sausage, bologna, salami, chitterlings, fatback, hot dogs, bratwurst, and packaged luncheon meats. Salted nuts and seeds. Canned beans with salt. Dairy Whole or 2% milk, cream, half-and-half, and cream cheese. Whole-fat or sweetened yogurt. Full-fat cheeses or blue cheese. Nondairy creamers and whipped toppings. Processed cheese, cheese spreads, or cheese curds. Condiments Onion and garlic salt, seasoned salt, table salt, and sea salt. Canned and packaged gravies. Worcestershire sauce. Tartar sauce. Barbecue sauce. Teriyaki sauce. Soy sauce, including reduced sodium. Steak sauce. Fish sauce. Oyster sauce. Cocktail sauce. Horseradish. Ketchup and mustard. Meat flavorings and tenderizers. Bouillon cubes. Hot sauce. Tabasco sauce. Marinades. Taco seasonings. Relishes. Fats and Oils Butter, stick margarine, lard, shortening, ghee, and bacon fat. Coconut, palm kernel, or palm oils. Regular salad dressings. Other Pickles and olives. Salted popcorn and pretzels. The items listed above may not be a complete list of foods and beverages to avoid. Contact your dietitian for more information. WHERE CAN I FIND MORE INFORMATION? National Heart, Lung, and Blood Institute: travelstabloid.com   This information is not intended to replace  advice given to you by your health care provider. Make sure you discuss any questions you have with your health care provider.   Document Released: 09/01/2011 Document Revised: 10/03/2014 Document Reviewed: 07/17/2013 Elsevier Interactive Patient Education [employer].

## 2016-03-28 NOTE — Progress Notes (Signed)
BP 120/72   Pulse 75   Temp 98.8 F (37.1 C) (Oral)   Resp 16   Ht 5\' 2"  (1.575 m)   Wt 170 lb 1.6 oz (77.2 kg)   SpO2 98%   BMI 31.11 kg/m    Subjective:    Patient ID: Regina Dunlap, female    DOB: 06-15-1961, 55 y.o.   MRN: [id number]  HPI: Regina Dunlap is a 55 y.o. female  Chief Complaint  Patient presents with  . Follow-up   She is here follow-up; her previous provider left the office Under stress at work; sometimes does not sleep well, 5-6 hours a night; quit coffee a few weeks ago Spasms in the abdomen, a year duration; told her previous docotor; had scans done\ Has acid reflux; using omeprazole; she is trying to limit triggers Using naproxen; not ibuprofen; using naproxen for aches in shoulder, maybe 2x a week and does not flare symptoms Has furosemide in her feet; takes that PRN; eats banana and OJ Prediabetes; A1c 6.1 TG 181 Weight is a struggle for her she says  Depression screen [facility] 2/9 03/28/2016 09/16/2015  Decreased Interest 0 0  Down, Depressed, Hopeless 0 0  PHQ - 2 Score 0 0   Relevant past medical, surgical, family and social history reviewed Past Medical History:  Diagnosis Date  . Abdominal pain, right upper quadrant   . Edema   . Esophageal reflux   . Mastodynia   . Morbid obesity ([place])   . Obstructive sleep apnea (adult) (pediatric)   . Other and unspecified hyperlipidemia   . Pain in thoracic spine   . Postmenopausal bleeding   . Prediabetes 03/28/2016  . Sprain of ankle, unspecified site   . Unspecified essential hypertension   . Unspecified sinusitis (chronic)   . Unspecified sleep apnea    Past Surgical History:  Procedure Laterality Date  . BREAST BIOPSY Right 1999   neg  . BREAST CYST ASPIRATION Right    neg  . TUBAL LIGATION     History reviewed. No pertinent family history.  MD notes Sister - stage 4 colon cancer Sister - died from cervical cancer Father - 70 years old, alive, health declining; NO HTN, no  cholesterol NO DM  Social History  Substance Use Topics  . Smoking status: Never Smoker  . Smokeless tobacco: Not on file  . Alcohol use Yes     Comment: social   Interim medical history since last visit reviewed. Allergies and medications reviewed  Review of Systems Per HPI unless specifically indicated above     Objective:    BP 120/72   Pulse 75   Temp 98.8 F (37.1 C) (Oral)   Resp 16   Ht 5\' 2"  (1.575 m)   Wt 170 lb 1.6 oz (77.2 kg)   SpO2 98%   BMI 31.11 kg/m   Wt Readings from Last 3 Encounters:  03/28/16 170 lb 1.6 oz (77.2 kg)  10/13/15 166 lb 4.8 oz (75.4 kg)  09/16/15 164 lb 1.6 oz (74.4 kg)    Physical Exam  Constitutional: She appears well-developed and well-nourished. No distress.  Obese, with mild weight gain over last 6 months (just under 4 pounds)  HENT:  Head: Normocephalic and atraumatic.  Eyes: EOM are normal. No scleral icterus.  Neck: No thyromegaly present.  Cardiovascular: Normal rate, regular rhythm and normal heart sounds.   No murmur heard. Pulmonary/Chest: Effort normal and breath sounds normal. No respiratory distress. She has no wheezes.  Abdominal: Soft. Bowel sounds are normal. She exhibits no distension.  Musculoskeletal: Normal range of motion. She exhibits no edema.  Neurological: She is alert. She exhibits normal muscle tone.  Skin: Skin is warm and dry. She is not diaphoretic. No pallor.  Psychiatric: She has a normal mood and affect. Her behavior is normal. Judgment and thought content normal.    Results for orders placed or performed in visit on 10/13/15  CBC with Differential/Platelet  Result Value Ref Range   WBC 5.5 3.4 - 10.8 x10E3/uL   RBC 4.45 3.77 - 5.28 x10E6/uL   Hemoglobin 13.1 11.1 - 15.9 g/dL   Hematocrit 38.6 34.0 - 46.6 %   MCV 87 79 - 97 fL   MCH 29.4 26.6 - 33.0 pg   MCHC 33.9 31.5 - 35.7 g/dL   RDW 14.0 12.3 - 15.4 %   Platelets 239 150 - 379 x10E3/uL   Neutrophils 47 %   Lymphs 44 %   Monocytes 4  %   Eos 4 %   Basos 1 %   Neutrophils Absolute 2.6 1.4 - 7.0 x10E3/uL   Lymphocytes Absolute 2.4 0.7 - 3.1 x10E3/uL   Monocytes Absolute 0.2 0.1 - 0.9 x10E3/uL   EOS (ABSOLUTE) 0.2 0.0 - 0.4 x10E3/uL   Basophils Absolute 0.0 0.0 - 0.2 x10E3/uL   Immature Granulocytes 0 %   Immature Grans (Abs) 0.0 0.0 - 0.1 x10E3/uL  Comprehensive metabolic panel  Result Value Ref Range   Glucose 103 (H) 65 - 99 mg/dL   BUN 20 6 - 24 mg/dL   Creatinine, Ser 0.90 0.57 - 1.00 mg/dL   GFR calc non Af Amer 74 >59 mL/min/1.73   GFR calc Af Amer 85 >59 mL/min/1.73   BUN/Creatinine Ratio 22 9 - 23   Sodium 145 (H) 134 - 144 mmol/L   Potassium 3.6 3.5 - 5.2 mmol/L   Chloride 100 96 - 106 mmol/L   CO2 25 18 - 29 mmol/L   Calcium 9.4 8.7 - 10.2 mg/dL   Total Protein 6.8 6.0 - 8.5 g/dL   Albumin 4.3 3.5 - 5.5 g/dL   Globulin, Total 2.5 1.5 - 4.5 g/dL   Albumin/Globulin Ratio 1.7 1.1 - 2.5   Bilirubin Total 0.3 0.0 - 1.2 mg/dL   Alkaline Phosphatase 102 39 - 117 IU/L   AST 22 0 - 40 IU/L   ALT 21 0 - 32 IU/L  Lipid panel  Result Value Ref Range   Cholesterol, Total 181 100 - 199 mg/dL   Triglycerides 181 (H) 0 - 149 mg/dL   HDL 54 >39 mg/dL   VLDL Cholesterol Cal 36 5 - 40 mg/dL   LDL Calculated 91 0 - 99 mg/dL   Chol/HDL Ratio 3.4 0.0 - 4.4 ratio units  Hemoglobin A1c  Result Value Ref Range   Hgb A1c MFr Bld 6.1 (H) 4.8 - 5.6 %   Est. average glucose Bld gHb Est-mCnc 128 mg/dL  TSH  Result Value Ref Range   TSH 2.590 0.450 - 4.500 uIU/mL  HIV antibody  Result Value Ref Range   HIV Screen 4th Generation wRfx Non Reactive Non Reactive      Assessment & Plan:   Problem List Items Addressed This Visit      Cardiovascular and Mediastinum   Hypertension goal BP (blood pressure) < 140/90    Well-controlled; try DASH guidelines        Other   Weight gain - Primary    Check TSH  Relevant Orders   TSH   Prediabetes   Relevant Orders   Hemoglobin A1c   Memory impairment    Relevant Orders   Ambulatory referral to Neurology   Medication monitoring encounter   Relevant Orders   Comprehensive metabolic panel   Hypercholesterolemia with hypertriglyceridemia    Check on or after July 18th      Relevant Orders   Lipid Panel w/o Chol/HDL Ratio   Decreased sense of smell   Relevant Orders   Ambulatory referral to Neurology   Abdominal spasms    Other Visit Diagnoses   None.     Follow up plan: Return in about 7 months (around 10/27/2016).  An after-visit summary was printed and given to the patient at [place].  Please see the patient instructions which may contain other information and recommendations beyond what is mentioned above in the assessment and plan.  Meds ordered this encounter  Medications  . hyoscyamine (LEVSIN, ANASPAZ) 0.125 MG tablet    Sig: Take 1 tablet (0.125 mg total) by mouth every 6 (six) hours as needed.    Dispense:  60 tablet    Refill:  2    Orders Placed This Encounter  Procedures  . Hemoglobin A1c  . Lipid Panel w/o Chol/HDL Ratio  . TSH  . Comprehensive metabolic panel  . Ambulatory referral to Neurology

## 2016-03-28 NOTE — Assessment & Plan Note (Signed)
Check TSH 

## 2016-03-28 NOTE — Assessment & Plan Note (Signed)
Check on or after July 18th

## 2016-04-18 ENCOUNTER — Other Ambulatory Visit: Payer: Self-pay

## 2016-04-18 DIAGNOSIS — E782 Mixed hyperlipidemia: Secondary | ICD-10-CM

## 2016-04-19 MED ORDER — OMEGA-3-ACID ETHYL ESTERS 1 G PO CAPS: 1.0000 g | ORAL_CAPSULE | Freq: Two times a day (BID) | ORAL | 0 refills | Status: DC

## 2016-04-19 NOTE — Telephone Encounter (Signed)
Note to pharmacy; due for labs

## 2016-04-20 ENCOUNTER — Other Ambulatory Visit: Payer: Self-pay | Admitting: Family Medicine

## 2016-04-20 NOTE — Telephone Encounter (Signed)
Left voice mail

## 2016-04-20 NOTE — Telephone Encounter (Signed)
Please remind patient to get the labs ordered at the last visit; we'll need those to see if cholesterol med is right dose and if liver okay with it; I'll send Rx but want to me sure she's on right med and it's not hurting her; thank you

## 2016-05-18 ENCOUNTER — Encounter: Payer: Self-pay | Admitting: Family Medicine

## 2016-05-18 ENCOUNTER — Other Ambulatory Visit: Payer: Self-pay | Admitting: Family Medicine

## 2016-05-18 DIAGNOSIS — E782 Mixed hyperlipidemia: Secondary | ICD-10-CM

## 2016-05-18 MED ORDER — LORATADINE 10 MG PO TABS: 10.0000 mg | ORAL_TABLET | Freq: Every day | ORAL | 5 refills | Status: DC | PRN

## 2016-05-18 NOTE — Telephone Encounter (Signed)
Please give patient a gentle reminder of the last that were ordered in July; thank you

## 2016-05-18 NOTE — Telephone Encounter (Signed)
Please see earlier note about reminding patient to get labs

## 2016-05-19 ENCOUNTER — Other Ambulatory Visit: Payer: Self-pay

## 2016-05-19 ENCOUNTER — Other Ambulatory Visit: Payer: Self-pay | Admitting: Family Medicine

## 2016-05-19 DIAGNOSIS — R635 Abnormal weight gain: Secondary | ICD-10-CM

## 2016-05-19 DIAGNOSIS — E782 Mixed hyperlipidemia: Secondary | ICD-10-CM

## 2016-05-19 DIAGNOSIS — Z5181 Encounter for therapeutic drug level monitoring: Secondary | ICD-10-CM

## 2016-05-19 DIAGNOSIS — R7303 Prediabetes: Secondary | ICD-10-CM

## 2016-05-19 LAB — COMPLETE METABOLIC PANEL WITH GFR
ALBUMIN: 4.2 g/dL (ref 3.6–5.1)
ALK PHOS: 84 U/L (ref 33–130)
ALT: 25 U/L (ref 6–29)
AST: 22 U/L (ref 10–35)
BILIRUBIN TOTAL: 0.5 mg/dL (ref 0.2–1.2)
BUN: 19 mg/dL (ref 7–25)
CALCIUM: 9.1 mg/dL (ref 8.6–10.4)
CO2: 28 mmol/L (ref 20–31)
CREATININE: 1.02 mg/dL (ref 0.50–1.05)
Chloride: 103 mmol/L (ref 98–110)
GFR, Est African American: 73 mL/min (ref >=60)
GFR, Est Non African American: 63 mL/min (ref >=60)
GLUCOSE: 110 mg/dL — AB (ref 65–99)
Potassium: 3.2 mmol/L — ABNORMAL LOW (ref 3.5–5.3)
SODIUM: 144 mmol/L (ref 135–146)
TOTAL PROTEIN: 7 g/dL (ref 6.1–8.1)

## 2016-05-19 LAB — LIPID PANEL
CHOLESTEROL: 162 mg/dL (ref 125–200)
HDL: 54 mg/dL (ref >=46)
LDL Cholesterol: 72 mg/dL (ref <130)
TRIGLYCERIDES: 178 mg/dL — AB (ref <150)
Total CHOL/HDL Ratio: 3 Ratio (ref <=5.0)
VLDL: 36 mg/dL — ABNORMAL HIGH (ref <30)

## 2016-05-19 LAB — TSH: TSH: 2.35 mIU/L

## 2016-05-19 NOTE — Telephone Encounter (Signed)
Left voice mail

## 2016-05-19 NOTE — Telephone Encounter (Signed)
Labs are due; one month approved only

## 2016-05-20 ENCOUNTER — Other Ambulatory Visit: Payer: Self-pay | Admitting: Family Medicine

## 2016-05-20 LAB — HEMOGLOBIN A1C
Hgb A1c MFr Bld: 5.7 % — ABNORMAL HIGH (ref <5.7)
MEAN PLASMA GLUCOSE: 117 mg/dL

## 2016-05-20 MED ORDER — CHLORTHALIDONE 25 MG PO TABS: 25.0000 mg | ORAL_TABLET | Freq: Every day | ORAL | 1 refills | Status: DC

## 2016-05-20 MED ORDER — POTASSIUM CHLORIDE ER 10 MEQ PO TBCR: 10.0000 meq | EXTENDED_RELEASE_TABLET | Freq: Two times a day (BID) | ORAL | 0 refills | Status: DC

## 2016-05-20 NOTE — Progress Notes (Signed)
Reduce chlorthalidone from 50 mg to 25 mg daily; short-term potassium for low K+

## 2016-05-23 ENCOUNTER — Other Ambulatory Visit: Payer: Self-pay

## 2016-05-23 MED ORDER — LOSARTAN POTASSIUM 100 MG PO TABS: 100.0000 mg | ORAL_TABLET | Freq: Every day | ORAL | 5 refills | Status: DC

## 2016-05-23 NOTE — Telephone Encounter (Signed)
Last cr and K+ reviewed; Rx approved 

## 2016-05-27 ENCOUNTER — Other Ambulatory Visit: Payer: Self-pay | Admitting: Family Medicine

## 2016-05-27 NOTE — Telephone Encounter (Signed)
Last lipid and sgpt from May 19, 2016 reviewed; Rx approved

## 2016-06-07 DIAGNOSIS — G3184 Mild cognitive impairment, so stated: Secondary | ICD-10-CM | POA: Insufficient documentation

## 2016-06-07 DIAGNOSIS — Z87820 Personal history of traumatic brain injury: Secondary | ICD-10-CM | POA: Insufficient documentation

## 2016-06-08 ENCOUNTER — Other Ambulatory Visit: Payer: Self-pay

## 2016-06-08 MED ORDER — OMEPRAZOLE 20 MG PO CPDR: 20.0000 mg | DELAYED_RELEASE_CAPSULE | Freq: Every day | ORAL | 0 refills | Status: DC | PRN

## 2016-06-13 ENCOUNTER — Other Ambulatory Visit: Payer: Self-pay | Admitting: Family Medicine

## 2016-06-13 DIAGNOSIS — E782 Mixed hyperlipidemia: Secondary | ICD-10-CM

## 2016-06-13 NOTE — Telephone Encounter (Signed)
Reviewed last lipids; okay at this dose

## 2016-06-17 ENCOUNTER — Other Ambulatory Visit: Payer: Self-pay | Admitting: Family Medicine

## 2016-06-17 NOTE — Telephone Encounter (Signed)
rx for chlorthalidone 50 declined; dose was changed; wrong Rx I approved 6 month supply of the 25 mg strength in August She should not need any of this for months

## 2016-06-20 ENCOUNTER — Other Ambulatory Visit: Payer: Self-pay

## 2016-06-20 DIAGNOSIS — J309 Allergic rhinitis, unspecified: Secondary | ICD-10-CM

## 2016-06-21 MED ORDER — FLUTICASONE PROPIONATE 50 MCG/ACT NA SUSP: 2.0000 | Freq: Every day | NASAL | 9 refills | Status: DC

## 2016-07-05 ENCOUNTER — Other Ambulatory Visit: Payer: Self-pay | Admitting: Family Medicine

## 2016-07-06 MED ORDER — RANITIDINE HCL 300 MG PO TABS: 300.0000 mg | ORAL_TABLET | Freq: Every day | ORAL | 5 refills | Status: DC

## 2016-07-06 NOTE — Telephone Encounter (Signed)
I am going to wean patient off of omeprazole Instruct her to skip doses on Wednesdays and Sundays ADD ranitidine 300 mg at bedtime Use less and less of the omeprazole over next month or two and we'll get her off of this drug

## 2016-07-06 NOTE — Telephone Encounter (Signed)
Left detailed voicemail

## 2016-08-09 DIAGNOSIS — G3184 Mild cognitive impairment, so stated: Secondary | ICD-10-CM | POA: Diagnosis not present

## 2016-08-16 ENCOUNTER — Other Ambulatory Visit: Payer: Self-pay | Admitting: Family Medicine

## 2016-08-16 NOTE — Telephone Encounter (Signed)
Will wean more

## 2016-10-09 ENCOUNTER — Other Ambulatory Visit: Payer: Self-pay | Admitting: Family Medicine

## 2016-10-12 ENCOUNTER — Other Ambulatory Visit: Payer: Self-pay | Admitting: Family Medicine

## 2016-10-12 NOTE — Telephone Encounter (Signed)
Patient should just be using H2 blocker at this point hopefully; weaned her off PPI

## 2016-10-28 ENCOUNTER — Ambulatory Visit: Admitting: Family Medicine

## 2016-11-01 DIAGNOSIS — M5387 Other specified dorsopathies, lumbosacral region: Secondary | ICD-10-CM | POA: Diagnosis not present

## 2016-11-01 DIAGNOSIS — M9903 Segmental and somatic dysfunction of lumbar region: Secondary | ICD-10-CM | POA: Diagnosis not present

## 2016-11-04 ENCOUNTER — Encounter: Payer: Self-pay | Admitting: Family Medicine

## 2016-11-04 ENCOUNTER — Ambulatory Visit (INDEPENDENT_AMBULATORY_CARE_PROVIDER_SITE_OTHER): Payer: BLUE CROSS/BLUE SHIELD | Admitting: Family Medicine

## 2016-11-04 VITALS — BP 124/74 | HR 81 | Temp 97.7°F | Resp 14 | Wt 163.0 lb

## 2016-11-04 DIAGNOSIS — R7303 Prediabetes: Secondary | ICD-10-CM | POA: Diagnosis not present

## 2016-11-04 DIAGNOSIS — K760 Fatty (change of) liver, not elsewhere classified: Secondary | ICD-10-CM

## 2016-11-04 DIAGNOSIS — Z5181 Encounter for therapeutic drug level monitoring: Secondary | ICD-10-CM

## 2016-11-04 DIAGNOSIS — I10 Essential (primary) hypertension: Secondary | ICD-10-CM | POA: Diagnosis not present

## 2016-11-04 DIAGNOSIS — Z1159 Encounter for screening for other viral diseases: Secondary | ICD-10-CM

## 2016-11-04 DIAGNOSIS — E782 Mixed hyperlipidemia: Secondary | ICD-10-CM

## 2016-11-04 DIAGNOSIS — R1011 Right upper quadrant pain: Secondary | ICD-10-CM

## 2016-11-04 DIAGNOSIS — E663 Overweight: Secondary | ICD-10-CM | POA: Diagnosis not present

## 2016-11-04 DIAGNOSIS — Z23 Encounter for immunization: Secondary | ICD-10-CM

## 2016-11-04 DIAGNOSIS — E669 Obesity, unspecified: Secondary | ICD-10-CM | POA: Insufficient documentation

## 2016-11-04 HISTORY — DX: Fatty (change of) liver, not elsewhere classified: K76.0

## 2016-11-04 HISTORY — DX: Overweight: E66.3

## 2016-11-04 NOTE — Assessment & Plan Note (Signed)
Check labs today.

## 2016-11-04 NOTE — Assessment & Plan Note (Signed)
Continue to limit saturated fats; check lipids, continue meds

## 2016-11-04 NOTE — Assessment & Plan Note (Signed)
Reviewed last [place]; will have radiology clarify and repeat US

## 2016-11-04 NOTE — Assessment & Plan Note (Signed)
Well-controlled; continue medicines, DASH guidelines

## 2016-11-04 NOTE — Progress Notes (Signed)
BP 124/74   Pulse 81   Temp 97.7 F (36.5 C) (Oral)   Resp 14   Wt 163 lb (73.9 kg)   SpO2 96%   BMI 29.81 kg/m    Subjective:    Patient ID: Regina Dunlap, female    DOB: 11/29/1960, 56 y.o.   MRN: [id number]  HPI: Regina Dunlap is a 56 y.o. female  Chief Complaint  Patient presents with  . Follow-up   Patient is here for follow-up She had the flu shot and took vitamin C  She has hypertension; on 100 mg losartan and 25 mg chlorthalidone Not much salt, not much processed foods Diagnosed 13 years ago; was in perfect health until she started working and then started issues Father had HTN and had a stroke, lost vision, could not feel left side; he is okay just a little bit slower  High cholesterol; taking statin and lovaza; no problems; tries to stay away from fatty foods; loves fried chicken but she bought an air fryer and makes roasted chicken and does not eat the skin; lots of broiler and steaming now; she is not fasting and will return in the next week or two  Postmenopausal bleeding completely stopped; had [place]; sees GYN for yearly visit in [place]  Losing weight by eating better  She was having neck pain, sleeping wrong, then saw chiropractor and fixed almost completely after two visits; little pinch on the right side; she had a big knot on the back and she could feel it  Had 2-3 glasses of wine about 3 weeks ago; had a little upset stomach epigastrium, RUQ for a little while;  Reviewed the [place] from years ago; no focal liver lesions versus focal liver lesions  Depression screen [facility] 2/9 11/04/2016 03/28/2016 09/16/2015  Decreased Interest 0 0 0  Down, Depressed, Hopeless 0 0 0  PHQ - 2 Score 0 0 0   Relevant past medical, surgical, family and social history reviewed Past Medical History:  Diagnosis Date  . Esophageal reflux   . Fatty liver 11/04/2016  . Mastodynia   . Obstructive sleep apnea (adult) (pediatric)   . Other and unspecified hyperlipidemia   .  Overweight (BMI 25.0-29.9) 11/04/2016  . Pain in thoracic spine   . Prediabetes 03/28/2016  . Unspecified essential hypertension   . Unspecified sleep apnea    Past Surgical History:  Procedure Laterality Date  . BREAST BIOPSY Right 1999   neg  . BREAST CYST ASPIRATION Right    neg  . TUBAL LIGATION     Family History  Problem Relation Age of Onset  . Migraines Mother   . Stroke Father   . Hypertension Father   . Cancer Sister     cervical  . Cancer Sister     colon   Social History  Substance Use Topics  . Smoking status: Never Smoker  . Smokeless tobacco: Never Used  . Alcohol use Yes     Comment: social   Interim medical history since last visit reviewed. Allergies and medications reviewed  Review of Systems Per HPI unless specifically indicated above     Objective:    BP 124/74   Pulse 81   Temp 97.7 F (36.5 C) (Oral)   Resp 14   Wt 163 lb (73.9 kg)   SpO2 96%   BMI 29.81 kg/m   Wt Readings from Last 3 Encounters:  11/04/16 163 lb (73.9 kg)  03/28/16 170 lb 1.6 oz (77.2 kg)  10/13/15 166 lb 4.8 oz (75.4 kg)    Physical Exam  Constitutional: She appears well-developed and well-nourished. No distress.  Overweight, weight loss of over 7 pounds over last 7 months  HENT:  Head: Normocephalic and atraumatic.  Eyes: EOM are normal. No scleral icterus.  Neck: No thyromegaly present.  Cardiovascular: Normal rate, regular rhythm and normal heart sounds.   No murmur heard. Pulmonary/Chest: Effort normal and breath sounds normal. No respiratory distress. She has no wheezes.  Abdominal: Soft. Bowel sounds are normal. She exhibits no distension.  Musculoskeletal: Normal range of motion. She exhibits no edema.  Neurological: She is alert. She exhibits normal muscle tone.  Skin: Skin is warm and dry. She is not diaphoretic. No pallor.  Psychiatric: She has a normal mood and affect. Her behavior is normal. Judgment and thought content normal.    Results for  orders placed or performed in visit on 05/19/16  HgB A1c  Result Value Ref Range   Hgb A1c MFr Bld 5.7 (H) <5.7 %   Mean Plasma Glucose 117 mg/dL  Lipid Profile  Result Value Ref Range   Cholesterol 162 125 - 200 mg/dL   Triglycerides 178 (H) <150 mg/dL   HDL 54 >=46 mg/dL   Total CHOL/HDL Ratio 3.0 <=5.0 Ratio   VLDL 36 (H) <30 mg/dL   LDL Cholesterol 72 <130 mg/dL  TSH  Result Value Ref Range   TSH 2.35 mIU/L  COMPLETE METABOLIC PANEL WITH GFR  Result Value Ref Range   Sodium 144 135 - 146 mmol/L   Potassium 3.2 (L) 3.5 - 5.3 mmol/L   Chloride 103 98 - 110 mmol/L   CO2 28 20 - 31 mmol/L   Glucose, Bld 110 (H) 65 - 99 mg/dL   BUN 19 7 - 25 mg/dL   Creat 1.02 0.50 - 1.05 mg/dL   Total Bilirubin 0.5 0.2 - 1.2 mg/dL   Alkaline Phosphatase 84 33 - 130 U/L   AST 22 10 - 35 U/L   ALT 25 6 - 29 U/L   Total Protein 7.0 6.1 - 8.1 g/dL   Albumin 4.2 3.6 - 5.1 g/dL   Calcium 9.1 8.6 - 10.4 mg/dL   GFR, Est African American 73 >=60 mL/min   GFR, Est Non African American 63 >=60 mL/min      Assessment & Plan:   Problem List Items Addressed This Visit      Cardiovascular and Mediastinum   Hypertension goal BP (blood pressure) < 140/90 - Primary    Well-controlled; continue medicines, DASH guidelines        Digestive   Fatty liver    Reviewed prior US; continue to work on weight loss; check LFTs, repeat the [place]      Relevant Orders   COMPLETE METABOLIC PANEL WITH GFR     Other   RUQ pain    Reviewed last [place]; will have radiology clarify and repeat US      Relevant Orders   US Abdomen Limited RUQ (Completed)   CBC with Differential/Platelet   COMPLETE METABOLIC PANEL WITH GFR   Prediabetes    Check A1c and glcuose      Relevant Orders   Hemoglobin A1c   Overweight (BMI 25.0-29.9)    Praise given      Medication monitoring encounter    Check labs today      Relevant Orders   CBC with Differential/Platelet   COMPLETE METABOLIC PANEL WITH GFR    Hypercholesterolemia with hypertriglyceridemia  Continue to limit saturated fats; check lipids, continue meds      Relevant Orders   Lipid panel    Other Visit Diagnoses    Need for hepatitis C screening test       Relevant Orders   Hepatitis C Antibody   Need for diphtheria-tetanus-pertussis (Tdap) vaccine       Relevant Orders   Tdap vaccine greater than or equal to 7yo IM (Completed)      Follow up plan: Return in about 6 months (around 05/04/2017) for fasting labs only.  An after-visit summary was printed and given to the patient at [place].  Please see the patient instructions which may contain other information and recommendations beyond what is mentioned above in the assessment and plan.  No orders of the defined types were placed in this encounter.   Orders Placed This Encounter  Procedures  . US Abdomen Limited RUQ  . Tdap vaccine greater than or equal to 7yo IM  . Lipid panel  . CBC with Differential/Platelet  . Hemoglobin A1c  . COMPLETE METABOLIC PANEL WITH GFR  . Hepatitis C Antibody

## 2016-11-04 NOTE — Assessment & Plan Note (Signed)
Reviewed prior US; continue to work on weight loss; check LFTs, repeat the [place]

## 2016-11-04 NOTE — Assessment & Plan Note (Signed)
Praise given 

## 2016-11-04 NOTE — Assessment & Plan Note (Signed)
Check A1c and glcuose

## 2016-11-04 NOTE — Patient Instructions (Addendum)
Keep up the great job with weight loss Try to limit saturated fats in your diet (bologna, hot dogs, barbeque, cheeseburgers, hamburgers, steak, bacon, sausage, cheese, etc.) and get more fresh fruits, vegetables, and whole grains We'll have the ultrasound done soon Return for fasting labs on or after November 21, 2016

## 2016-11-07 ENCOUNTER — Other Ambulatory Visit: Payer: Self-pay

## 2016-11-07 MED ORDER — LOSARTAN POTASSIUM 100 MG PO TABS: 100.0000 mg | ORAL_TABLET | Freq: Every day | ORAL | 0 refills | Status: DC

## 2016-11-07 MED ORDER — SIMVASTATIN 40 MG PO TABS: 40.0000 mg | ORAL_TABLET | Freq: Every day | ORAL | 0 refills | Status: DC

## 2016-11-07 NOTE — Telephone Encounter (Signed)
Pharmacy requesting 90 day supply

## 2016-11-07 NOTE — Telephone Encounter (Signed)
rx approved

## 2016-11-08 ENCOUNTER — Other Ambulatory Visit: Payer: Self-pay

## 2016-11-08 MED ORDER — LORATADINE 10 MG PO TABS: 10.0000 mg | ORAL_TABLET | Freq: Every day | ORAL | 11 refills | Status: DC | PRN

## 2016-11-09 DIAGNOSIS — R51 Headache: Secondary | ICD-10-CM | POA: Diagnosis not present

## 2016-11-09 DIAGNOSIS — Z87820 Personal history of traumatic brain injury: Secondary | ICD-10-CM | POA: Diagnosis not present

## 2016-11-09 DIAGNOSIS — G4733 Obstructive sleep apnea (adult) (pediatric): Secondary | ICD-10-CM | POA: Diagnosis not present

## 2016-11-09 DIAGNOSIS — G3184 Mild cognitive impairment, so stated: Secondary | ICD-10-CM | POA: Diagnosis not present

## 2016-11-10 ENCOUNTER — Ambulatory Visit
Admission: RE | Admit: 2016-11-10 | Discharge: 2016-11-10 | Disposition: A | Discharge status: Home / Self Care | Payer: BLUE CROSS/BLUE SHIELD | Source: Ambulatory Visit | Attending: Family Medicine | Admitting: Family Medicine

## 2016-11-10 DIAGNOSIS — K760 Fatty (change of) liver, not elsewhere classified: Secondary | ICD-10-CM | POA: Diagnosis not present

## 2016-11-10 DIAGNOSIS — R1011 Right upper quadrant pain: Secondary | ICD-10-CM | POA: Diagnosis present

## 2016-11-11 ENCOUNTER — Other Ambulatory Visit: Payer: Self-pay

## 2016-11-14 ENCOUNTER — Other Ambulatory Visit: Payer: Self-pay | Admitting: Family Medicine

## 2016-11-16 DIAGNOSIS — J019 Acute sinusitis, unspecified: Secondary | ICD-10-CM | POA: Diagnosis not present

## 2016-12-01 ENCOUNTER — Other Ambulatory Visit: Payer: Self-pay | Admitting: Family Medicine

## 2016-12-01 NOTE — Telephone Encounter (Signed)
Resolved with the pharmacy.

## 2016-12-01 NOTE — Telephone Encounter (Signed)
I approved a year of the loratidine on 11/08/16, confirmed by pharmacy Please resolve with [facility] [address]  loratadine (CLARITIN) 10 MG tablet 30 tablet 11 11/08/2016    Sig - Route: Take 1 tablet (10 mg total) by mouth daily as needed for allergies. - Oral   E-Prescribing Status: Receipt confirmed by pharmacy (11/08/2016 5:14 PM EST)

## 2016-12-12 ENCOUNTER — Other Ambulatory Visit: Payer: Self-pay | Admitting: Family Medicine

## 2016-12-12 NOTE — Telephone Encounter (Signed)
Please give patient a gentle reminder to get the labs done that were ordered in February; thank you I sent the Rx as requested

## 2016-12-12 NOTE — Telephone Encounter (Signed)
Left detail voice mail

## 2016-12-16 ENCOUNTER — Other Ambulatory Visit: Payer: Self-pay | Admitting: Family Medicine

## 2016-12-16 DIAGNOSIS — E782 Mixed hyperlipidemia: Secondary | ICD-10-CM | POA: Diagnosis not present

## 2016-12-16 DIAGNOSIS — Z1159 Encounter for screening for other viral diseases: Secondary | ICD-10-CM | POA: Diagnosis not present

## 2016-12-16 DIAGNOSIS — R1011 Right upper quadrant pain: Secondary | ICD-10-CM | POA: Diagnosis not present

## 2016-12-16 DIAGNOSIS — K760 Fatty (change of) liver, not elsewhere classified: Secondary | ICD-10-CM | POA: Diagnosis not present

## 2016-12-16 DIAGNOSIS — R7303 Prediabetes: Secondary | ICD-10-CM | POA: Diagnosis not present

## 2016-12-16 LAB — LIPID PANEL
CHOL/HDL RATIO: 3 ratio (ref <5.0)
Cholesterol: 166 mg/dL (ref <200)
HDL: 56 mg/dL (ref >50)
LDL Cholesterol: 86 mg/dL (ref <100)
Triglycerides: 121 mg/dL (ref <150)
VLDL: 24 mg/dL (ref <30)

## 2016-12-16 LAB — CBC WITH DIFFERENTIAL/PLATELET
BASOS ABS: 0 {cells}/uL (ref 0–200)
Basophils Relative: 0 %
Eosinophils Absolute: 150 cells/uL (ref 15–500)
Eosinophils Relative: 3 %
HCT: 40.3 % (ref 35.0–45.0)
Hemoglobin: 13.4 g/dL (ref 11.7–15.5)
Lymphocytes Relative: 46 %
Lymphs Abs: 2300 cells/uL (ref 850–3900)
MCH: 30 pg (ref 27.0–33.0)
MCHC: 33.3 g/dL (ref 32.0–36.0)
MCV: 90.2 fL (ref 80.0–100.0)
MONOS PCT: 4 %
MPV: 10.1 fL (ref 7.5–12.5)
Monocytes Absolute: 200 cells/uL (ref 200–950)
NEUTROS ABS: 2350 {cells}/uL (ref 1500–7800)
NEUTROS PCT: 47 %
PLATELETS: 233 10*3/uL (ref 140–400)
RBC: 4.47 MIL/uL (ref 3.80–5.10)
RDW: 13.8 % (ref 11.0–15.0)
WBC: 5 10*3/uL (ref 3.8–10.8)

## 2016-12-16 LAB — COMPLETE METABOLIC PANEL WITH GFR
ALBUMIN: 4.2 g/dL (ref 3.6–5.1)
ALT: 18 U/L (ref 6–29)
AST: 18 U/L (ref 10–35)
Alkaline Phosphatase: 82 U/L (ref 33–130)
BILIRUBIN TOTAL: 0.5 mg/dL (ref 0.2–1.2)
BUN: 23 mg/dL (ref 7–25)
CALCIUM: 9 mg/dL (ref 8.6–10.4)
CO2: 28 mmol/L (ref 20–31)
Chloride: 103 mmol/L (ref 98–110)
Creat: 1 mg/dL (ref 0.50–1.05)
GFR, EST AFRICAN AMERICAN: 74 mL/min (ref >=60)
GFR, EST NON AFRICAN AMERICAN: 64 mL/min (ref >=60)
Glucose, Bld: 89 mg/dL (ref 65–99)
Potassium: 3.6 mmol/L (ref 3.5–5.3)
Sodium: 141 mmol/L (ref 135–146)
TOTAL PROTEIN: 6.9 g/dL (ref 6.1–8.1)

## 2016-12-17 LAB — HEPATITIS C ANTIBODY: HCV Ab: NEGATIVE

## 2016-12-17 LAB — HEMOGLOBIN A1C
HEMOGLOBIN A1C: 5.6 % (ref <5.7)
MEAN PLASMA GLUCOSE: 114 mg/dL

## 2016-12-18 ENCOUNTER — Other Ambulatory Visit: Payer: Self-pay | Admitting: Family Medicine

## 2016-12-18 MED ORDER — CHLORTHALIDONE 25 MG PO TABS: 25.0000 mg | ORAL_TABLET | Freq: Every day | ORAL | 6 refills | Status: DC

## 2016-12-18 MED ORDER — SIMVASTATIN 40 MG PO TABS: 40.0000 mg | ORAL_TABLET | Freq: Every day | ORAL | 2 refills | Status: DC

## 2016-12-18 MED ORDER — LOSARTAN POTASSIUM 100 MG PO TABS: 100.0000 mg | ORAL_TABLET | Freq: Every day | ORAL | 2 refills | Status: DC

## 2016-12-18 NOTE — Progress Notes (Signed)
Med refills sent

## 2017-01-20 DIAGNOSIS — G4733 Obstructive sleep apnea (adult) (pediatric): Secondary | ICD-10-CM | POA: Diagnosis not present

## 2017-01-22 ENCOUNTER — Other Ambulatory Visit: Payer: Self-pay | Admitting: Family Medicine

## 2017-01-22 DIAGNOSIS — E782 Mixed hyperlipidemia: Secondary | ICD-10-CM

## 2017-01-23 NOTE — Telephone Encounter (Signed)
March 2018 lipids reviewed; Rx approved

## 2017-01-25 ENCOUNTER — Other Ambulatory Visit: Payer: Self-pay | Admitting: Family Medicine

## 2017-04-10 ENCOUNTER — Encounter: Payer: BLUE CROSS/BLUE SHIELD | Admitting: Family Medicine

## 2017-04-24 ENCOUNTER — Encounter: Payer: Self-pay | Admitting: Family Medicine

## 2017-04-24 ENCOUNTER — Ambulatory Visit (INDEPENDENT_AMBULATORY_CARE_PROVIDER_SITE_OTHER): Payer: BLUE CROSS/BLUE SHIELD | Admitting: Family Medicine

## 2017-04-24 ENCOUNTER — Other Ambulatory Visit: Payer: Self-pay | Admitting: Family Medicine

## 2017-04-24 VITALS — BP 132/84 | HR 77 | Temp 97.8°F | Resp 14 | Ht 61.25 in | Wt 166.8 lb

## 2017-04-24 DIAGNOSIS — Z0000 Encounter for general adult medical examination without abnormal findings: Secondary | ICD-10-CM | POA: Diagnosis not present

## 2017-04-24 DIAGNOSIS — Z124 Encounter for screening for malignant neoplasm of cervix: Secondary | ICD-10-CM | POA: Diagnosis not present

## 2017-04-24 DIAGNOSIS — G4730 Sleep apnea, unspecified: Secondary | ICD-10-CM | POA: Diagnosis not present

## 2017-04-24 DIAGNOSIS — R079 Chest pain, unspecified: Secondary | ICD-10-CM | POA: Diagnosis not present

## 2017-04-24 DIAGNOSIS — E669 Obesity, unspecified: Secondary | ICD-10-CM | POA: Diagnosis not present

## 2017-04-24 DIAGNOSIS — Z1239 Encounter for other screening for malignant neoplasm of breast: Secondary | ICD-10-CM

## 2017-04-24 DIAGNOSIS — N898 Other specified noninflammatory disorders of vagina: Secondary | ICD-10-CM

## 2017-04-24 DIAGNOSIS — Z1231 Encounter for screening mammogram for malignant neoplasm of breast: Secondary | ICD-10-CM

## 2017-04-24 DIAGNOSIS — E66811 Obesity, class 1: Secondary | ICD-10-CM

## 2017-04-24 DIAGNOSIS — Z1211 Encounter for screening for malignant neoplasm of colon: Secondary | ICD-10-CM | POA: Insufficient documentation

## 2017-04-24 LAB — CBC WITH DIFFERENTIAL/PLATELET
BASOS PCT: 0 %
Basophils Absolute: 0 cells/uL (ref 0–200)
Eosinophils Absolute: 268 cells/uL (ref 15–500)
Eosinophils Relative: 4 %
HEMATOCRIT: 40.9 % (ref 35.0–45.0)
HEMOGLOBIN: 13.6 g/dL (ref 11.7–15.5)
LYMPHS ABS: 2613 {cells}/uL (ref 850–3900)
Lymphocytes Relative: 39 %
MCH: 30.2 pg (ref 27.0–33.0)
MCHC: 33.3 g/dL (ref 32.0–36.0)
MCV: 90.7 fL (ref 80.0–100.0)
MONO ABS: 402 {cells}/uL (ref 200–950)
MPV: 10.5 fL (ref 7.5–12.5)
Monocytes Relative: 6 %
Neutro Abs: 3417 cells/uL (ref 1500–7800)
Neutrophils Relative %: 51 %
Platelets: 251 10*3/uL (ref 140–400)
RBC: 4.51 MIL/uL (ref 3.80–5.10)
RDW: 14 % (ref 11.0–15.0)
WBC: 6.7 10*3/uL (ref 3.8–10.8)

## 2017-04-24 LAB — LIPID PANEL
CHOLESTEROL: 165 mg/dL (ref <200)
HDL: 59 mg/dL (ref >50)
LDL Cholesterol: 68 mg/dL (ref <100)
Total CHOL/HDL Ratio: 2.8 Ratio (ref <5.0)
Triglycerides: 192 mg/dL — ABNORMAL HIGH (ref <150)
VLDL: 38 mg/dL — AB (ref <30)

## 2017-04-24 LAB — COMPLETE METABOLIC PANEL WITH GFR
ALBUMIN: 4.3 g/dL (ref 3.6–5.1)
ALT: 33 U/L — AB (ref 6–29)
AST: 25 U/L (ref 10–35)
Alkaline Phosphatase: 76 U/L (ref 33–130)
BUN: 19 mg/dL (ref 7–25)
CALCIUM: 9.3 mg/dL (ref 8.6–10.4)
CHLORIDE: 99 mmol/L (ref 98–110)
CO2: 27 mmol/L (ref 20–31)
CREATININE: 0.87 mg/dL (ref 0.50–1.05)
GFR, Est African American: 87 mL/min (ref >=60)
GFR, Est Non African American: 76 mL/min (ref >=60)
GLUCOSE: 89 mg/dL (ref 65–99)
Potassium: 3.2 mmol/L — ABNORMAL LOW (ref 3.5–5.3)
Sodium: 140 mmol/L (ref 135–146)
Total Bilirubin: 0.4 mg/dL (ref 0.2–1.2)
Total Protein: 7.1 g/dL (ref 6.1–8.1)

## 2017-04-24 LAB — TSH: TSH: 1.4 m[IU]/L

## 2017-04-24 NOTE — Progress Notes (Signed)
Patient ID: Regina Dunlap, female   DOB: [birth year], 56 y.o.   MRN: [id number]   Subjective:   Regina Dunlap is a 56 y.o. female here for a complete physical exam  Interim issues since last visit: no medical excitements ----------------------------------------- Separate visit later for leg pain -------------------------------------------  USPSTF grade A and B recommendations Depression:  Depression screen [facility] 2/9 04/24/2017 11/04/2016 03/28/2016 09/16/2015  Decreased Interest 0 0 0 0  Down, Depressed, Hopeless 0 0 0 0  PHQ - 2 Score 0 0 0 0   Hypertension: trending up but controlled; not much salt; aggravated before coming in with a meeting; some tension BP Readings from Last 3 Encounters:  04/24/17 132/84  11/04/16 124/74  03/28/16 120/72   Obesity: started back to exercising, rough patch over last 2 weeks Wt Readings from Last 3 Encounters:  04/24/17 166 lb 12.8 oz (75.7 kg)  11/04/16 163 lb (73.9 kg)  03/28/16 170 lb 1.6 oz (77.2 kg)   BMI Readings from Last 3 Encounters:  04/24/17 31.26 kg/m  11/04/16 29.81 kg/m  03/28/16 31.11 kg/m    Alcohol: socially Tobacco use: never HIV, hep B, hep C: tested STD testing and prevention (chl/gon/syphilis): not interested Intimate partner violence: no abuse Breast cancer: no lumps; hx of lumpectomy (benign) years ago BRCA gene screening: no breast, ovarian, or prostate cancer Cervical cancer screening: pap smear today; no hx of abnormal pap  Osteoporosis: no steroids as a little girl, had steroid shots to scalp for alopecia in 1992 Fall prevention/vitamin D: out in sun fair amount, taking Ca2+ plus Zn2+ and Mg2+ plus D3 Lipids:  Lab Results  Component Value Date   CHOL 166 12/16/2016   CHOL 162 05/19/2016   CHOL 181 10/13/2015   Lab Results  Component Value Date   HDL 56 12/16/2016   HDL 54 05/19/2016   HDL 54 10/13/2015   Lab Results  Component Value Date   LDLCALC 86 12/16/2016   LDLCALC 72  05/19/2016   LDLCALC 91 10/13/2015   Lab Results  Component Value Date   TRIG 121 12/16/2016   TRIG 178 (H) 05/19/2016   TRIG 181 (H) 10/13/2015   Lab Results  Component Value Date   CHOLHDL 3.0 12/16/2016   CHOLHDL 3.0 05/19/2016   CHOLHDL 3.4 10/13/2015   No results found for: LDLDIRECT Glucose:  Glucose  Date Value Ref Range Status  10/13/2015 103 (H) 65 - 99 mg/dL Final    Comment:    Specimen received in contact with cells. No visible hemolysis present. However GLUC may be decreased and K increased. Clinical correlation indicated.    Glucose, Bld  Date Value Ref Range Status  12/16/2016 89 65 - 99 mg/dL Final  05/19/2016 110 (H) 65 - 99 mg/dL Final   Colorectal cancer: due 2021 Lung cancer: never smoker AAA: n/a Aspirin: taking 81 mg aspirin daily Diet: good eater Exercise: started back, was in [place] and walked and moved more; will exercise with daughter Skin cancer: nothing worrisome; skin changes on face derm said "getting old"  Past Medical History:  Diagnosis Date  . Esophageal reflux   . Fatty liver 11/04/2016  . Mastodynia   . Obstructive sleep apnea (adult) (pediatric)   . Other and unspecified hyperlipidemia   . Overweight (BMI 25.0-29.9) 11/04/2016  . Pain in thoracic spine   . Prediabetes 03/28/2016  . Unspecified essential hypertension   . Unspecified sleep apnea    Past Surgical History:  Procedure Laterality Date  .  BREAST BIOPSY Right 1999   neg  . BREAST CYST ASPIRATION Right    neg  . TUBAL LIGATION     Family History  Problem Relation Age of Onset  . Migraines Mother   . Stroke Father   . Hypertension Father   . Hypertension Sister   . Anuerysm Brother   . Hypertension Sister   . Cancer Sister        cervical  . Cancer Sister        colon   Social History  Substance Use Topics  . Smoking status: Never Smoker  . Smokeless tobacco: Never Used  . Alcohol use Yes     Comment: social   Review of Systems  Constitutional:  Negative for unexpected weight change.  HENT: Negative for hearing loss.   Eyes: Negative for visual disturbance.  Respiratory: Negative for wheezing.   Cardiovascular: Positive for chest pain (1-2 weeks ago; sharp pains; felt like something in the back; rubbed it and it went away; no pressure; thought is was muscular and was lifting weights; no assoc nausea; lasted 1-2 days and then went away; no fam hx of heart disease).  Endocrine: Negative for polydipsia.  Allergic/Immunologic: Negative for food allergies.  Neurological: Negative for tremors.  Hematological: Bruises/bleeds easily (marks on arms and legs, going on for a few years).    Objective:   Vitals:   04/24/17 1422  BP: 132/84  Pulse: 77  Resp: 14  Temp: 97.8 F (36.6 C)  TempSrc: Oral  SpO2: 97%  Weight: 166 lb 12.8 oz (75.7 kg)  Height: 5' 1.25" (1.556 m)   Body mass index is 31.26 kg/m. Wt Readings from Last 3 Encounters:  04/24/17 166 lb 12.8 oz (75.7 kg)  11/04/16 163 lb (73.9 kg)  03/28/16 170 lb 1.6 oz (77.2 kg)   Physical Exam  Constitutional: She appears well-developed and well-nourished.  HENT:  Head: Normocephalic and atraumatic.  Eyes: Conjunctivae and EOM are normal. Right eye exhibits no hordeolum. Left eye exhibits no hordeolum. No scleral icterus.  Neck: Carotid bruit is not present. No thyromegaly present.  Cardiovascular: Normal rate, regular rhythm, S1 normal, S2 normal and normal heart sounds.   No extrasystoles are present.  Pulmonary/Chest: Effort normal and breath sounds normal. No respiratory distress. Right breast exhibits no inverted nipple, no mass, no nipple discharge, no skin change and no tenderness. Left breast exhibits no inverted nipple, no mass, no nipple discharge, no skin change and no tenderness. Breasts are symmetrical.  Abdominal: Soft. Normal appearance and bowel sounds are normal. She exhibits no distension, no abdominal bruit, no pulsatile midline mass and no mass. There is no  hepatosplenomegaly. There is no tenderness. No hernia.  Genitourinary: Uterus normal. Pelvic exam was performed with patient prone. There is no rash or lesion on the right labia. There is no rash or lesion on the left labia. Uterus is not enlarged and not tender. Cervix exhibits no motion tenderness, no discharge and no friability. Right adnexum displays no mass, no tenderness and no fullness. Left adnexum displays no mass, no tenderness and no fullness. No erythema, tenderness or bleeding in the vagina. Vaginal discharge (cloudy whitish discharge; no fishy odor) found.  Musculoskeletal: Normal range of motion. She exhibits no edema.  Lymphadenopathy:       Head (right side): No submandibular adenopathy present.       Head (left side): No submandibular adenopathy present.    She has no cervical adenopathy.    She has no  axillary adenopathy.  Neurological: She is alert. She displays no tremor. No cranial nerve deficit. She exhibits normal muscle tone. Gait normal.  Skin: Skin is warm and dry. No bruising and no ecchymosis noted. No cyanosis. No pallor.  Psychiatric: Her speech is normal and behavior is normal. Thought content normal. Her mood appears not anxious. She does not exhibit a depressed mood.    Assessment/Plan:   Problem List Items Addressed This Visit      Respiratory   Sleep apnea    Using CPAP        Other   Preventative health care - Primary    USPSTF grade A and B recommendations reviewed with patient; age-appropriate recommendations, preventive care, screening tests, etc discussed and encouraged; healthy living encouraged; see AVS for patient education given to patient      Relevant Orders   CBC with Differential/Platelet   COMPLETE METABOLIC PANEL WITH GFR   Lipid panel   TSH   Obesity (BMI 30.0-34.9)    Discussed weight; she will try to get this down       Other Visit Diagnoses    Screening for breast cancer       Relevant Orders   MM Digital Screening    Chest pain, unspecified type       pt thinks this was muscular; EKG done today, read by MD, NSR, no ST-T wave changes; given age, risk factors, will refer to cardiologist, call 911 if needed   Relevant Orders   Ambulatory referral to Cardiology   EKG 12-Lead (Completed)   Cervical cancer screening       Relevant Orders   Pap IG and HPV (high risk) DNA detection   Vaginal discharge       Relevant Orders   WET PREP BY MOLECULAR PROBE       Meds ordered this encounter  Medications  . Krill Oil 1000 MG CAPS    Sig: Take 1 capsule by mouth.  . Multiple Minerals-Vitamins (CALCIUM-MAGNESIUM-ZINC-D3) TABS    Sig: Take 2 tablets by mouth daily.   Orders Placed This Encounter  Procedures  . WET PREP BY MOLECULAR PROBE  . MM Digital Screening    Standing Status:   Future    Standing Expiration Date:   06/25/2018    Order Specific Question:   Reason for Exam (SYMPTOM  OR DIAGNOSIS REQUIRED)    Answer:   screen for breast cancer    Order Specific Question:   Is the patient pregnant?    Answer:   No    Order Specific Question:   Preferred imaging location?    Answer:   [facility]  . CBC with Differential/Platelet  . COMPLETE METABOLIC PANEL WITH GFR  . Lipid panel  . TSH  . Ambulatory referral to Cardiology    Referral Priority:   High    Referral Type:   Consultation    Referral Reason:   Specialty Services Required    Requested Specialty:   Cardiology    Number of Visits Requested:   1  . EKG 12-Lead    Follow up plan: Return in about 1 year (around 04/24/2018) for complete physical, next week or two for other issues.  An After Visit Summary was printed and given to the patient.

## 2017-04-24 NOTE — Patient Instructions (Addendum)
Return for E&M visit for legs and other issues  Health Maintenance, Female Adopting a healthy lifestyle and getting preventive care can go a long way to promote health and wellness. Talk with your health care provider about what schedule of regular examinations is right for you. This is a good chance for you to check in with your provider about disease prevention and staying healthy. In between checkups, there are plenty of things you can do on your own. Experts have done a lot of research about which lifestyle changes and preventive measures are most likely to keep you healthy. Ask your health care provider for more information. Weight and diet Eat a healthy diet  Be sure to include plenty of vegetables, fruits, low-fat dairy products, and lean protein.  Do not eat a lot of foods high in solid fats, added sugars, or salt.  Get regular exercise. This is one of the most important things you can do for your health. ? Most adults should exercise for at least 150 minutes each week. The exercise should increase your heart rate and make you sweat (moderate-intensity exercise). ? Most adults should also do strengthening exercises at least twice a week. This is in addition to the moderate-intensity exercise.  Maintain a healthy weight  Body mass index (BMI) is a measurement that can be used to identify possible weight problems. It estimates body fat based on height and weight. Your health care provider can help determine your BMI and help you achieve or maintain a healthy weight.  For females 82 years of age and older: ? A BMI below 18.5 is considered underweight. ? A BMI of 18.5 to 24.9 is normal. ? A BMI of 25 to 29.9 is considered overweight. ? A BMI of 30 and above is considered obese.  Watch levels of cholesterol and blood lipids  You should start having your blood tested for lipids and cholesterol at 54 years of age, then have this test every 5 years.  You may need to have your  cholesterol levels checked more often if: ? Your lipid or cholesterol levels are high. ? You are older than 54 years of age. ? You are at high risk for heart disease.  Cancer screening Lung Cancer  Lung cancer screening is recommended for adults 26-80 years old who are at high risk for lung cancer because of a history of smoking.  A yearly low-dose CT scan of the lungs is recommended for people who: ? Currently smoke. ? Have quit within the past 15 years. ? Have at least a 30-pack-year history of smoking. A pack year is smoking an average of one pack of cigarettes a day for 1 year.  Yearly screening should continue until it has been 15 years since you quit.  Yearly screening should stop if you develop a health problem that would prevent you from having lung cancer treatment.  Breast Cancer  Practice breast self-awareness. This means understanding how your breasts normally appear and feel.  It also means doing regular breast self-exams. Let your health care provider know about any changes, no matter how small.  If you are in your 20s or 30s, you should have a clinical breast exam (CBE) by a health care provider every 1-3 years as part of a regular health exam.  If you are 79 or older, have a CBE every year. Also consider having a breast X-ray (mammogram) every year.  If you have a family history of breast cancer, talk to your health care provider  about genetic screening.  If you are at high risk for breast cancer, talk to your health care provider about having an MRI and a mammogram every year.  Breast cancer gene (BRCA) assessment is recommended for women who have family members with BRCA-related cancers. BRCA-related cancers include: ? Breast. ? Ovarian. ? Tubal. ? Peritoneal cancers.  Results of the assessment will determine the need for genetic counseling and BRCA1 and BRCA2 testing.  Cervical Cancer Your health care provider may recommend that you be screened regularly  for cancer of the pelvic organs (ovaries, uterus, and vagina). This screening involves a pelvic examination, including checking for microscopic changes to the surface of your cervix (Pap test). You may be encouraged to have this screening done every 3 years, beginning at age 19.  For women ages 50-65, health care providers may recommend pelvic exams and Pap testing every 3 years, or they may recommend the Pap and pelvic exam, combined with testing for human papilloma virus (HPV), every 5 years. Some types of HPV increase your risk of cervical cancer. Testing for HPV may also be done on women of any age with unclear Pap test results.  Other health care providers may not recommend any screening for nonpregnant women who are considered low risk for pelvic cancer and who do not have symptoms. Ask your health care provider if a screening pelvic exam is right for you.  If you have had past treatment for cervical cancer or a condition that could lead to cancer, you need Pap tests and screening for cancer for at least 20 years after your treatment. If Pap tests have been discontinued, your risk factors (such as having a new sexual partner) need to be reassessed to determine if screening should resume. Some women have medical problems that increase the chance of getting cervical cancer. In these cases, your health care provider may recommend more frequent screening and Pap tests.  Colorectal Cancer  This type of cancer can be detected and often prevented.  Routine colorectal cancer screening usually begins at 54 years of age and continues through 54 years of age.  Your health care provider may recommend screening at an earlier age if you have risk factors for colon cancer.  Your health care provider may also recommend using home test kits to check for hidden blood in the stool.  A small camera at the end of a tube can be used to examine your colon directly (sigmoidoscopy or colonoscopy). This is done to  check for the earliest forms of colorectal cancer.  Routine screening usually begins at age 28.  Direct examination of the colon should be repeated every 5-10 years through 54 years of age. However, you may need to be screened more often if early forms of precancerous polyps or small growths are found.  Skin Cancer  Check your skin from head to toe regularly.  Tell your health care provider about any new moles or changes in moles, especially if there is a change in a mole's shape or color.  Also tell your health care provider if you have a mole that is larger than the size of a pencil eraser.  Always use sunscreen. Apply sunscreen liberally and repeatedly throughout the day.  Protect yourself by wearing long sleeves, pants, a wide-brimmed hat, and sunglasses whenever you are outside.  Heart disease, diabetes, and high blood pressure  High blood pressure causes heart disease and increases the risk of stroke. High blood pressure is more likely to develop in: ?  People who have blood pressure in the high end of the normal range (130-139/85-89 mm Hg). ? People who are overweight or obese. ? People who are African American.  If you are 18-39 years of age, have your blood pressure checked every 3-5 years. If you are 40 years of age or older, have your blood pressure checked every year. You should have your blood pressure measured twice-once when you are at a hospital or clinic, and once when you are not at a hospital or clinic. Record the average of the two measurements. To check your blood pressure when you are not at a hospital or clinic, you can use: ? An automated blood pressure machine at a pharmacy. ? A home blood pressure monitor.  If you are between 55 years and 79 years old, ask your health care provider if you should take aspirin to prevent strokes.  Have regular diabetes screenings. This involves taking a blood sample to check your fasting blood sugar level. ? If you are at a  normal weight and have a low risk for diabetes, have this test once every three years after 54 years of age. ? If you are overweight and have a high risk for diabetes, consider being tested at a younger age or more often. Preventing infection Hepatitis B  If you have a higher risk for hepatitis B, you should be screened for this virus. You are considered at high risk for hepatitis B if: ? You were born in a country where hepatitis B is common. Ask your health care provider which countries are considered high risk. ? Your parents were born in a high-risk country, and you have not been immunized against hepatitis B (hepatitis B vaccine). ? You have HIV or AIDS. ? You use needles to inject street drugs. ? You live with someone who has hepatitis B. ? You have had sex with someone who has hepatitis B. ? You get hemodialysis treatment. ? You take certain medicines for conditions, including cancer, organ transplantation, and autoimmune conditions.  Hepatitis C  Blood testing is recommended for: ? Everyone born from 1945 through 1965. ? Anyone with known risk factors for hepatitis C.  Sexually transmitted infections (STIs)  You should be screened for sexually transmitted infections (STIs) including gonorrhea and chlamydia if: ? You are sexually active and are younger than 54 years of age. ? You are older than 54 years of age and your health care provider tells you that you are at risk for this type of infection. ? Your sexual activity has changed since you were last screened and you are at an increased risk for chlamydia or gonorrhea. Ask your health care provider if you are at risk.  If you do not have HIV, but are at risk, it may be recommended that you take a prescription medicine daily to prevent HIV infection. This is called pre-exposure prophylaxis (PrEP). You are considered at risk if: ? You are sexually active and do not regularly use condoms or know the HIV status of your  partner(s). ? You take drugs by injection. ? You are sexually active with a partner who has HIV.  Talk with your health care provider about whether you are at high risk of being infected with HIV. If you choose to begin PrEP, you should first be tested for HIV. You should then be tested every 3 months for as long as you are taking PrEP. Pregnancy  If you are premenopausal and you may become pregnant, ask your health   care provider about preconception counseling.  If you may become pregnant, take 400 to 800 micrograms (mcg) of folic acid every day.  If you want to prevent pregnancy, talk to your health care provider about birth control (contraception). Osteoporosis and menopause  Osteoporosis is a disease in which the bones lose minerals and strength with aging. This can result in serious bone fractures. Your risk for osteoporosis can be identified using a bone density scan.  If you are 65 years of age or older, or if you are at risk for osteoporosis and fractures, ask your health care provider if you should be screened.  Ask your health care provider whether you should take a calcium or vitamin D supplement to lower your risk for osteoporosis.  Menopause may have certain physical symptoms and risks.  Hormone replacement therapy may reduce some of these symptoms and risks. Talk to your health care provider about whether hormone replacement therapy is right for you. Follow these instructions at home:  Schedule regular health, dental, and eye exams.  Stay current with your immunizations.  Do not use any tobacco products including cigarettes, chewing tobacco, or electronic cigarettes.  If you are pregnant, do not drink alcohol.  If you are breastfeeding, limit how much and how often you drink alcohol.  Limit alcohol intake to no more than 1 drink per day for nonpregnant women. One drink equals 12 ounces of beer, 5 ounces of wine, or 1 ounces of hard liquor.  Do not use street  drugs.  Do not share needles.  Ask your health care provider for help if you need support or information about quitting drugs.  Tell your health care provider if you often feel depressed.  Tell your health care provider if you have ever been abused or do not feel safe at home. This information is not intended to replace advice given to you by your health care provider. Make sure you discuss any questions you have with your health care provider. Document Released: 03/28/2011 Document Revised: 02/18/2016 Document Reviewed: 06/16/2015 Elsevier Interactive Patient Education  2018 [employer]  

## 2017-04-24 NOTE — Assessment & Plan Note (Signed)
Using CPAP 

## 2017-04-24 NOTE — Assessment & Plan Note (Signed)
USPSTF grade A and B recommendations reviewed with patient; age-appropriate recommendations, preventive care, screening tests, etc discussed and encouraged; healthy living encouraged; see AVS for patient education given to patient  

## 2017-04-24 NOTE — Assessment & Plan Note (Signed)
Discussed weight; she will try to get this down

## 2017-04-25 ENCOUNTER — Other Ambulatory Visit: Payer: Self-pay | Admitting: Family Medicine

## 2017-04-25 ENCOUNTER — Telehealth: Payer: Self-pay | Admitting: Cardiovascular Disease

## 2017-04-25 LAB — WET PREP BY MOLECULAR PROBE
CANDIDA SPECIES: NOT DETECTED
Gardnerella vaginalis: NOT DETECTED
Trichomonas vaginosis: NOT DETECTED

## 2017-04-25 MED ORDER — POTASSIUM CHLORIDE ER 10 MEQ PO TBCR: 10.0000 meq | EXTENDED_RELEASE_TABLET | ORAL | 5 refills | Status: DC

## 2017-04-25 NOTE — Progress Notes (Signed)
klor rx

## 2017-04-25 NOTE — Telephone Encounter (Signed)
Received new patient from Dr. Sanda Klein for Chest pain .   Patient saw Fletcher Anon in 05/2014  Attempted to schedule next available.  LMOV to call office

## 2017-04-26 LAB — PAP IG AND HPV HIGH-RISK: HPV DNA High Risk: NOT DETECTED

## 2017-04-27 ENCOUNTER — Encounter: Payer: Self-pay | Admitting: Family Medicine

## 2017-04-27 ENCOUNTER — Ambulatory Visit (INDEPENDENT_AMBULATORY_CARE_PROVIDER_SITE_OTHER): Payer: BLUE CROSS/BLUE SHIELD | Admitting: Family Medicine

## 2017-04-27 VITALS — BP 130/82 | HR 81 | Temp 98.1°F | Resp 14 | Ht 61.0 in | Wt 169.9 lb

## 2017-04-27 DIAGNOSIS — I8393 Asymptomatic varicose veins of bilateral lower extremities: Secondary | ICD-10-CM

## 2017-04-27 DIAGNOSIS — R110 Nausea: Secondary | ICD-10-CM | POA: Diagnosis not present

## 2017-04-27 DIAGNOSIS — L659 Nonscarring hair loss, unspecified: Secondary | ICD-10-CM

## 2017-04-27 NOTE — Patient Instructions (Addendum)
I'll recommend over-the-counter horse chestnut for vein health, as well as magnesium 250 or 400 mg daily and that will be to help the potassium Continue to wear the compression stockings sun-up to sun-down Call me if you would like to see the vascular doctor about your veins Another thing you could try is tonic water, just 4 ounces in the evening I will suggest biotin over-the-counter by mouth You may have a condition called telogen effluvium; if that is the case, your hair should regrow in several months Call me if not improving and we can refer you to a dermatologist If you develop any pelvic pain or bloating or feel full early while eating, let me know right away as those can be signs of ovarian problems Ginger ale or ginger may help episodic nausea Keep me posted

## 2017-04-27 NOTE — Assessment & Plan Note (Signed)
Suggested horse chestnut, compression stockings; she can use magnesium and tonic water to help with symptoms of cramping; restless legs may be involved but will not start ropinirole or other medicine at this time; CBC normal; call if referral to vascular specialist desired

## 2017-04-27 NOTE — Progress Notes (Signed)
BP 130/82   Pulse 81   Temp 98.1 F (36.7 C) (Oral)   Resp 14   Ht 5\' 1"  (1.549 m)   Wt 169 lb 14.4 oz (77.1 kg)   SpO2 99%   BMI 32.10 kg/m    Subjective:    Patient ID: Regina Dunlap, female    DOB: [birth year], 56 y.o.   MRN: [id number]  HPI: Regina Dunlap is a 56 y.o. female  Chief Complaint  Patient presents with  . Leg Pain    Both legs and worst at night. Pt states she do not notice to much pain during the day; mostly at night.     HPI  She is having issues with her legs; she gets spots on the them Feels pain at night mostly in the calves; like a tightening; tries to stretch and it hurts No heat or redness or swelling Left equals right Going on for 2-3 weeks, wakes her up at night; turns on her side, moves around Dr. Nadine Counts did something on her legs; venous imaging of the left leg; after that study, was recommended to wear support hose Two of her older sisters have vein problems too in the legs Feels like she has to move her legs at nhight Hgb was normal; potassium was low Veins are dilating in the front; more noticeable in the left AC fossa, she points and shows me ------------------------------------------ Left venous duplex Jan 27, 2015:  FINDINGS: There is complete compressibility of the left common femoral, femoral, and popliteal veins. Doppler analysis demonstrates respiratory phasicity and augmentation of flow upon calf compression. No obvious calf vein thrombosis.  IMPRESSION: No evidence of left lower extremity DVT.   Electronically Signed   By: Marybelle Killings M.D.   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, but had it in 1992; had a little bald spot then Hair breaks No scalp scale or irritation or itchiness Washes hair every morning, typical hair  product switches at times, sulfate free Biotin in the hair shampoo  -----------------------------------------------  She felt like she was going to vomit one day; sucked on some ginger from [employer]; quieted things down; just a one time thing; smelled some food and felt nauseated; not pregnant she says; tubes tied, LMP 6 years ago; no abdominal bloating or pelvic pain; she hit her head and wasn't getting any smell, then saw neurologist; he started her on nortriptyline; referred to cognitive specialist, kept missing each other; stopped TCA 3 weeks ago  Depression screen [facility] 2/9 04/27/2017 04/24/2017 11/04/2016 03/28/2016 09/16/2015  Decreased Interest 0 0 0 0 0  Down, Depressed, Hopeless 0 0 0 0 0  PHQ - 2 Score 0 0 0 0 0    Relevant past medical, surgical, family and social history reviewed Past Medical History:  Diagnosis Date  . Esophageal reflux   . Fatty liver 11/04/2016  . Mastodynia   . Obstructive sleep apnea (adult) (pediatric)   . Other and unspecified hyperlipidemia   . Overweight (BMI 25.0-29.9) 11/04/2016  . Pain in thoracic spine   . Prediabetes 03/28/2016  . Unspecified essential hypertension   . Unspecified sleep apnea    Past Surgical History:  Procedure Laterality Date  . BREAST BIOPSY Right 1999   neg  . BREAST CYST ASPIRATION Right    neg  .  TUBAL LIGATION     Family History  Problem Relation Age of Onset  . Migraines Mother   . Stroke Father   . Hypertension Father   . Hypertension Sister   . Anuerysm Brother   . Hypertension Sister   . Cancer Sister        cervical  . Cancer Sister        colon   Social History   Social History  . Marital status: Married    Spouse name: N/A  . Number of children: N/A  . Years of education: N/A   Occupational History  . Not on file.   Social History Main Topics  . Smoking status: Never Smoker  . Smokeless tobacco: Never Used  . Alcohol use Yes     Comment: social  . Drug use: No  . Sexual activity: Yes    Other Topics Concern  . Not on file   Social History Narrative  . No narrative on file    Interim medical history since last visit reviewed. Allergies and medications reviewed  Review of Systems  Gastrointestinal: Positive for nausea. Negative for abdominal pain and blood in stool.   Per HPI unless specifically indicated above     Objective:    BP 130/82   Pulse 81   Temp 98.1 F (36.7 C) (Oral)   Resp 14   Ht 5\' 1"  (1.549 m)   Wt 169 lb 14.4 oz (77.1 kg)   SpO2 99%   BMI 32.10 kg/m   Wt Readings from Last 3 Encounters:  04/27/17 169 lb 14.4 oz (77.1 kg)  04/24/17 166 lb 12.8 oz (75.7 kg)  11/04/16 163 lb (73.9 kg)    Physical Exam  Constitutional: She appears well-developed and well-nourished.  HENT:  Mouth/Throat: Mucous membranes are normal.  Eyes: EOM are normal. No scleral icterus.  Cardiovascular: Normal rate and regular rhythm.   Pulmonary/Chest: Effort normal and breath sounds normal.  Abdominal: She exhibits no distension.  Musculoskeletal: She exhibits no edema.  Left popliteal fossa has numerous dilated veins, several superficial dilated varicose veins; no ulcers; no pitting edema  Skin: No rash noted. She is not diaphoretic. No pallor.  No horizontal lines on the fingernails; scalp appears normal without scale or erythema; no patches of alopecia seen; no significant hair breakage  Psychiatric: She has a normal mood and affect. Her behavior is normal.    Results for orders placed or performed in visit on 04/24/17  Pap IG and HPV (high risk) DNA detection  Result Value Ref Range   HPV DNA High Risk Not Detected    Specimen adequacy:     FINAL DIAGNOSIS:     COMMENTS:     Cytotechnologist:        Assessment & Plan:   Problem List Items Addressed This Visit      Cardiovascular and Mediastinum   Varicose veins of both lower extremities without ulcer or inflammation - Primary    Suggested horse chestnut, compression stockings; she can use  magnesium and tonic water to help with symptoms of cramping; restless legs may be involved but will not start ropinirole or other medicine at this time; CBC normal; call if referral to vascular specialist desired       Other Visit Diagnoses    Nausea       discussed s/s of ovarian pathology; with just two episodes, she'll just watch for now, but notify me if bloating, pelvic pain, early satiety, etc.   Non-scarring hair  loss       suspect telogen effluvium; discussed ddx; she may try biotin for hair breakage, but should regrow on its own; call if not improving after a few months      Follow up plan: No Follow-up on file.  An after-visit summary was printed and given to the patient at [facility].  Please see the patient instructions which may contain other information and recommendations beyond what is mentioned above in the assessment and plan.  Face-to-face time with patient was more than 25 minutes, >50% time spent counseling and coordination of care

## 2017-06-13 ENCOUNTER — Ambulatory Visit
Admission: RE | Admit: 2017-06-13 | Discharge: 2017-06-13 | Disposition: A | Discharge status: Home / Self Care | Payer: BLUE CROSS/BLUE SHIELD | Source: Ambulatory Visit | Attending: Family Medicine | Admitting: Family Medicine

## 2017-06-13 ENCOUNTER — Other Ambulatory Visit: Payer: Self-pay | Admitting: Family Medicine

## 2017-06-13 DIAGNOSIS — Z1239 Encounter for other screening for malignant neoplasm of breast: Secondary | ICD-10-CM

## 2017-06-13 DIAGNOSIS — Z1231 Encounter for screening mammogram for malignant neoplasm of breast: Secondary | ICD-10-CM | POA: Insufficient documentation

## 2017-06-30 ENCOUNTER — Encounter: Payer: Self-pay | Admitting: Cardiovascular Disease

## 2017-06-30 ENCOUNTER — Ambulatory Visit (INDEPENDENT_AMBULATORY_CARE_PROVIDER_SITE_OTHER): Payer: BLUE CROSS/BLUE SHIELD | Admitting: Cardiovascular Disease

## 2017-06-30 VITALS — BP 110/78 | HR 72 | Ht 62.0 in | Wt 162.8 lb

## 2017-06-30 DIAGNOSIS — R0789 Other chest pain: Secondary | ICD-10-CM | POA: Diagnosis not present

## 2017-06-30 DIAGNOSIS — E785 Hyperlipidemia, unspecified: Secondary | ICD-10-CM | POA: Diagnosis not present

## 2017-06-30 DIAGNOSIS — Z7689 Persons encountering health services in other specified circumstances: Secondary | ICD-10-CM

## 2017-06-30 DIAGNOSIS — I10 Essential (primary) hypertension: Secondary | ICD-10-CM | POA: Diagnosis not present

## 2017-06-30 NOTE — Patient Instructions (Signed)
Medication Instructions: Continue same medications.   Labwork: None.   Procedures/Testing: None.   Follow-Up: As needed with Dr. Arida.   Any Additional Special Instructions Will Be Listed Below (If Applicable).     If you need a refill on your cardiac medications before your next appointment, please call your pharmacy.   

## 2017-06-30 NOTE — Progress Notes (Signed)
Cardiology Office Note   Date:  06/30/2017   ID:  Regina Dunlap, Regina Dunlap 01/15/1961, MRN [id number]  PCP:  Arnetha Courser, MD  Cardiologist:   Kathlyn Sacramento, MD   Chief Complaint  Patient presents with  . other    Ref by Dr. Sanda Klein for bilateral leg pain with mostly in the left. Meds reviewed by the pt. verbally.        History of Present Illness: Regina Dunlap is a 56 y.o. female who was referred by Dr. Sanda Klein for evaluation of chest pain. She was seen by me in 2015 for exertional dyspnea. She has no previous cardiac history. She has known history of hypertension and hyperlipidemia. No diabetes or tobacco use. There is no family history of premature coronary artery disease.  She had cardiac workup at that time which showed normal echocardiogram and normal treadmill stress test.  She had an episode of chest pain about 2 months ago which was substernal and described as aching. It was intermittent and lasted for a few days and then resolved completely. No recurrent symptoms since then. She has been trying to be more active and has been walking for exercise with no reported chest pain, shortness of breath or palpitations. She has sleep apnea and uses CPAP on a regular basis. She also had leg cramps at night which resolved after she started taking magnesium supplements. She is feeling well at the present time.   Past Medical History:  Diagnosis Date  . Esophageal reflux   . Fatty liver 11/04/2016  . Mastodynia   . Obstructive sleep apnea (adult) (pediatric)   . Other and unspecified hyperlipidemia   . Overweight (BMI 25.0-29.9) 11/04/2016  . Pain in thoracic spine   . Prediabetes 03/28/2016  . Unspecified essential hypertension   . Unspecified sleep apnea     Past Surgical History:  Procedure Laterality Date  . BREAST BIOPSY Right 1999   neg  . BREAST CYST ASPIRATION Right    neg  . TUBAL LIGATION       Current Outpatient Prescriptions  Medication Sig Dispense  Refill  . aspirin 81 MG tablet Take 81 mg by mouth daily.    . chlorthalidone (HYGROTON) 25 MG tablet Take 1 tablet (25 mg total) by mouth daily. 30 tablet 6  . fluticasone (FLONASE) 50 MCG/ACT nasal spray Place 2 sprays into both nostrils daily. (Patient taking differently: Place 2 sprays into both nostrils as needed. ) 18 g 9  . hyoscyamine (LEVSIN, ANASPAZ) 0.125 MG tablet Take 1 tablet (0.125 mg total) by mouth every 6 (six) hours as needed. 60 tablet 2  . Krill Oil 1000 MG CAPS Take 1 capsule by mouth.    . loratadine (CLARITIN) 10 MG tablet Take 1 tablet (10 mg total) by mouth daily as needed for allergies. 30 tablet 11  . losartan (COZAAR) 100 MG tablet Take 1 tablet (100 mg total) by mouth daily. 90 tablet 2  . Multiple Minerals-Vitamins (CALCIUM-MAGNESIUM-ZINC-D3) TABS Take 2 tablets by mouth daily.    . naproxen (NAPROSYN) 500 MG tablet Take 500 mg by mouth as needed.     . potassium chloride (KLOR-CON 10) 10 MEQ tablet Take 1 tablet (10 mEq total) by mouth 2 (two) times a week. 9 tablet 5  . ranitidine (ZANTAC) 300 MG tablet TAKE 1 TABLET (300 MG TOTAL) BY MOUTH AT BEDTIME. FOR HEARTBURN, REFLUX 30 tablet 5  . simvastatin (ZOCOR) 40 MG tablet Take 1 tablet (40 mg total) by  mouth at bedtime. 90 tablet 2  . TURMERIC PO Take by mouth daily. Reported on 03/28/2016     No current facility-administered medications for this visit.     Allergies:   Ciprofloxacin; Lisinopril; Migraine formula [aspirin-acetaminophen-caffeine]; and Povidone iodine    Social History:  The patient  reports that she has never smoked. She has never used smokeless tobacco. She reports that she drinks alcohol. She reports that she does not use drugs.   Family History:  The patient's family history includes Anuerysm in her brother; Cancer in her sister and sister; Hypertension in her father, sister, and sister; Migraines in her mother; Stroke in her father.    ROS:  Please see the history of present illness.    Otherwise, review of systems are positive for none.   All other systems are reviewed and negative.    PHYSICAL EXAM: VS:  BP 110/78 (BP Location: Right Arm, Patient Position: Sitting, Cuff Size: Normal)   Pulse 72   Ht 5\' 2"  (1.575 m)   Wt 162 lb 12 oz (73.8 kg)   BMI 29.77 kg/m  , BMI Body mass index is 29.77 kg/m. GEN: Well nourished, well developed, in no acute distress  HEENT: normal  Neck: no JVD, carotid bruits, or masses Cardiac: RRR; no murmurs, rubs, or gallops,no edema  Respiratory:  clear to auscultation bilaterally, normal work of breathing GI: soft, nontender, nondistended, + BS MS: no deformity or atrophy  Skin: warm and dry, no rash Neuro:  Strength and sensation are intact Psych: euthymic mood, full affect Distal pulses are palpable.  EKG:  EKG is ordered today. The ekg ordered today demonstrates normal sinus rhythm with no significant ST or T wave changes.   Recent Labs: 04/24/2017: ALT 33; BUN 19; Creat 0.87; Hemoglobin 13.6; Platelets 251; Potassium 3.2; Sodium 140; TSH 1.40    Lipid Panel    Component Value Date/Time   CHOL 165 04/24/2017 1528   CHOL 181 10/13/2015 0911   TRIG 192 (H) 04/24/2017 1528   HDL 59 04/24/2017 1528   HDL 54 10/13/2015 0911   CHOLHDL 2.8 04/24/2017 1528   VLDL 38 (H) 04/24/2017 1528   LDLCALC 68 04/24/2017 1528   LDLCALC 91 10/13/2015 0911      Wt Readings from Last 3 Encounters:  06/30/17 162 lb 12 oz (73.8 kg)  04/27/17 169 lb 14.4 oz (77.1 kg)  04/24/17 166 lb 12.8 oz (75.7 kg)      No flowsheet data found.    ASSESSMENT AND PLAN:  1.  Atypical chest pain likely musculoskeletal: The patient has not had any further chest pain over the last 2 months. She's been trying to exercise with no exertional symptoms. Cardiac exam is unremarkable and baseline EKG is normal. Given that her symptoms resolved and she had previous negative cardiac workup in 2015, I do not recommend repeat testing. I discussed with her the  importance of continued healthy lifestyle changes and controlling her risk factors.  2. Leg cramps: Mostly at night and not with activities. Symptoms resolved after she started taking magnesium supplement. She does have varicose veins but overall she does not appear to be symptomatic from this.  3. Essential hypertension: Blood pressure is controlled on current medications.  4. Hyperlipidemia: Currently on simvastatin with most recent LDL of 68.    Disposition:   FU with me as needed.   Signed,  Kathlyn Sacramento, MD  06/30/2017 9:51 AM    [place]

## 2017-07-19 DIAGNOSIS — G4733 Obstructive sleep apnea (adult) (pediatric): Secondary | ICD-10-CM | POA: Diagnosis not present

## 2017-07-28 ENCOUNTER — Other Ambulatory Visit: Payer: Self-pay | Admitting: Family Medicine

## 2017-08-03 ENCOUNTER — Ambulatory Visit (INDEPENDENT_AMBULATORY_CARE_PROVIDER_SITE_OTHER): Payer: BLUE CROSS/BLUE SHIELD | Admitting: Family Medicine

## 2017-08-03 ENCOUNTER — Ambulatory Visit
Admission: RE | Admit: 2017-08-03 | Discharge: 2017-08-03 | Disposition: A | Discharge status: Home / Self Care | Payer: BLUE CROSS/BLUE SHIELD | Source: Ambulatory Visit | Attending: Family Medicine | Admitting: Family Medicine

## 2017-08-03 ENCOUNTER — Encounter: Payer: Self-pay | Admitting: Family Medicine

## 2017-08-03 DIAGNOSIS — K219 Gastro-esophageal reflux disease without esophagitis: Secondary | ICD-10-CM | POA: Diagnosis not present

## 2017-08-03 DIAGNOSIS — R05 Cough: Secondary | ICD-10-CM

## 2017-08-03 DIAGNOSIS — R059 Cough, unspecified: Secondary | ICD-10-CM | POA: Insufficient documentation

## 2017-08-03 MED ORDER — OMEPRAZOLE 20 MG PO CPDR: 20.0000 mg | DELAYED_RELEASE_CAPSULE | Freq: Two times a day (BID) | ORAL | 0 refills | Status: DC

## 2017-08-03 NOTE — Assessment & Plan Note (Signed)
Will start with CXR and PFTs; not on an ACE-I; already on PPI and H2 blocker; next step may include pulmonary referral or ENT

## 2017-08-03 NOTE — Progress Notes (Signed)
BP 140/82 (BP Location: Left Arm, Patient Position: Sitting, Cuff Size: Large)   Pulse 100   Temp 98.4 F (36.9 C) (Oral)   Ht 5\' 2"  (1.575 m)   Wt 166 lb 14.4 oz (75.7 kg)   SpO2 93%   BMI 30.53 kg/m    Subjective:    Patient ID: Regina Dunlap, female    DOB: [birth year], 56 y.o.   MRN: [id number]  HPI: Regina Dunlap is a 56 y.o. female  Chief Complaint  Patient presents with  . Cough    Pt states that she has had a cough x 8wks    HPI Patient is here for an acute visit She has had a cough for about 8 weeks Worse in the evenings  Nothing comes up No wheezing  She is not on an ACE-I She has GERD and has PPI and H2 blocker listed in her med list; taking ranitidine; stopped the omeprazole Not sure the cough started after the PPI was stopped The cough really started after she came down with a cold Different from allergies that she usually gets No new pets, no changes in home environment Does not correlate with furnace beng turned on She also has chlorambucil in her medicine list; [facility]; no [facility] written  She saw Dr. Fletcher Anon for atypical chest pain on 06/30/17 No mention in that note of cough; denied shortness of breath  Last CXR was 09/16/2015 CLINICAL DATA:  Intermittent episodes of upper bilateral chest pain for the past 3 days associated with shortness of breath  EXAM: CHEST  2 VIEW  COMPARISON:  PA and lateral chest x-ray of October 21, 2014  FINDINGS: The lungs are well-expanded and clear. There is no pneumothorax, pneumomediastinum, or pleural effusion. The heart and pulmonary vascularity are normal. The mediastinum is normal in width. The bony thorax exhibits no acute abnormality. There is gentle levocurvature centered in the lower thoracic spine.  IMPRESSION: There is no active cardiopulmonary disease.   Electronically Signed   By: Regina  Dunlap M.D.   On: 09/16/2015 15:12   Depression screen [facility]  2/9 08/03/2017 04/27/2017 04/24/2017 11/04/2016 03/28/2016  Decreased Interest 0 0 0 0 0  Down, Depressed, Hopeless 0 0 0 0 0  PHQ - 2 Score 0 0 0 0 0    Relevant past medical, surgical, family and social history reviewed Past Medical History:  Diagnosis Date  . Esophageal reflux   . Fatty liver 11/04/2016  . Mastodynia   . Obstructive sleep apnea (adult) (pediatric)   . Other and unspecified hyperlipidemia   . Overweight (BMI 25.0-29.9) 11/04/2016  . Pain in thoracic spine   . Prediabetes 03/28/2016  . Unspecified essential hypertension   . Unspecified sleep apnea    Past Surgical History:  Procedure Laterality Date  . BREAST BIOPSY Right 1999   neg  . BREAST CYST ASPIRATION Right    neg  . TUBAL LIGATION     Family History  Problem Relation Age of Onset  . Migraines Mother   . Stroke Father   . Hypertension Father   . Hypertension Sister   . Anuerysm Brother   . Hypertension Sister   . Cancer Sister        cervical  . Cancer Sister        colon   Social History   Socioeconomic History  . Marital status: Married    Spouse name: Not on file  . Number of children: Not on file  .  Years of education: Not on file  . Highest education level: Not on file  Social Needs  . Financial resource strain: Not on file  . Food insecurity - worry: Not on file  . Food insecurity - inability: Not on file  . Transportation needs - medical: Not on file  . Transportation needs - non-medical: Not on file  Occupational History  . Not on file  Tobacco Use  . Smoking status: Never Smoker  . Smokeless tobacco: Never Used  Substance and Sexual Activity  . Alcohol use: Yes    Comment: social  . Drug use: No  . Sexual activity: Yes  Other Topics Concern  . Not on file  Social History Narrative  . Not on file    Interim medical history since last visit reviewed. Allergies and medications reviewed  Review of Systems Per HPI unless specifically indicated above     Objective:    BP  140/82 (BP Location: Left Arm, Patient Position: Sitting, Cuff Size: Large)   Pulse 100   Temp 98.4 F (36.9 C) (Oral)   Ht 5\' 2"  (1.575 m)   Wt 166 lb 14.4 oz (75.7 kg)   SpO2 93%   BMI 30.53 kg/m   Wt Readings from Last 3 Encounters:  08/03/17 166 lb 14.4 oz (75.7 kg)  06/30/17 162 lb 12 oz (73.8 kg)  04/27/17 169 lb 14.4 oz (77.1 kg)    Physical Exam  Constitutional: She appears well-developed and well-nourished.  HENT:  Right Ear: Tympanic membrane normal. No drainage. Tympanic membrane is not erythematous.  Left Ear: Tympanic membrane normal. No drainage. Tympanic membrane is not erythematous.  Nose: Rhinorrhea (scant, clear) present.  Mouth/Throat: Oropharynx is clear and moist and mucous membranes are normal. No oropharyngeal exudate, posterior oropharyngeal edema or posterior oropharyngeal erythema.  String of cerumen removed under direct visualizaiton by MD from the RIGHT EAC; no cerumen in the left canal  Eyes: EOM are normal. No scleral icterus.  Cardiovascular: Normal rate and regular rhythm.  Pulmonary/Chest: Effort normal and breath sounds normal. No accessory muscle usage. No respiratory distress. She has no decreased breath sounds. She has no wheezes. She has no rhonchi.  Lymphadenopathy:    She has no cervical adenopathy.       Right: No supraclavicular adenopathy present.       Left: No supraclavicular adenopathy present.  Psychiatric: She has a normal mood and affect. Her behavior is normal.    Results for orders placed or performed in visit on 04/24/17  Pap IG and HPV (high risk) DNA detection  Result Value Ref Range   HPV DNA High Risk Not Detected    Specimen adequacy:     FINAL DIAGNOSIS:     COMMENTS:     Cytotechnologist:        Assessment & Plan:   Problem List Items Addressed This Visit      Digestive   GERD without esophagitis    Will start PPI BID for one month to see if this resolves symptoms      Relevant Medications   omeprazole  (PRILOSEC) 20 MG capsule     Other   Cough    Will start with CXR and PFTs; not on an ACE-I; already on PPI and H2 blocker; next step may include pulmonary referral or ENT      Relevant Orders   DG Chest 2 View (Completed)       Follow up plan: No Follow-up on file.  An after-visit summary  was printed and given to the patient at [place].  Please see the patient instructions which may contain other information and recommendations beyond what is mentioned above in the assessment and plan.  Meds ordered this encounter  Medications  . DISCONTD: loratadine (CLARITIN) 10 MG tablet    Sig: Take by mouth.  . DISCONTD: NORTRIPTYLINE HCL PO    Sig: Take by mouth.  . DISCONTD: SIMVASTATIN PO    Sig: Take by mouth.  . Aspirin-Calcium Carbonate (BAYER WOMENS) [id number] MG TABS    Sig: Take by mouth.  . DISCONTD: chlorambucil (LEUKERAN) 2 MG tablet    Sig: Take 2 mg daily by mouth.   . nortriptyline (PAMELOR) 10 MG capsule    Sig: Take 10-20mg  nightly as directed  . omega-3 acid ethyl esters (LOVAZA) 1 g capsule    Sig: Take 1 g daily by mouth.   . DISCONTD: omeprazole (PRILOSEC) 20 MG capsule    Sig: Take 20 mg as needed by mouth.   . Magnesium 100 MG CAPS    Sig: Take 1,000 mg by mouth.  Marland Kitchen omeprazole (PRILOSEC) 20 MG capsule    Sig: Take 1 capsule (20 mg total) 2 (two) times daily before a meal by mouth.    Dispense:  60 capsule    Refill:  0   MD note: patient NEVER took Leukeran (chlorambucil); we believe that was mistakenly put in the chart at a [facility]; she has no hx of leukemia or lymphoma, any reason to have been taking that; she also denies ever being given a prescription for that by mistake, as she says [facility] did not prescribe her anything  Orders Placed This Encounter  Procedures  . DG Chest 2 View

## 2017-08-03 NOTE — Patient Instructions (Signed)
Please have a chest xray done today across the street Elevate the head of the bed just a little Avoid triggers like coffee, chocolate, soft drinks, red and green pepper, onions, tomato-based foods, etc Start the omeprazole again and take twice a day Call me with an update in 3-4 weeks and let me know how you're doing We'll proceed with further testing if needed

## 2017-08-03 NOTE — Assessment & Plan Note (Signed)
Will start PPI BID for one month to see if this resolves symptoms

## 2017-08-30 ENCOUNTER — Other Ambulatory Visit: Payer: Self-pay | Admitting: Family Medicine

## 2017-09-11 IMAGING — US US ABDOMEN LIMITED
1 series · 14 of 25 positions shown · non-contrast
Comparison: 01/20/2014.

CLINICAL DATA: Right upper quadrant abdominal pain.

EXAM:
US ABDOMEN LIMITED - RIGHT UPPER QUADRANT

[Series 1: us abdomen limited · 0.17mm/px · 14 of 42 slices shown]
[im 1/42]
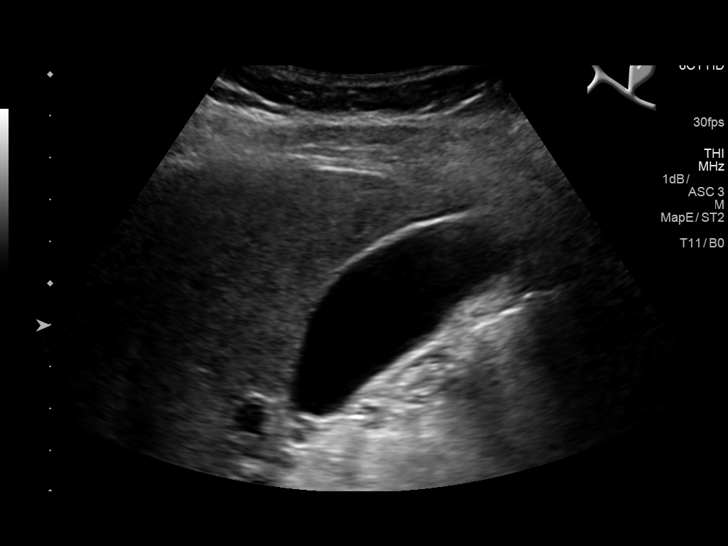
[im 4/42]
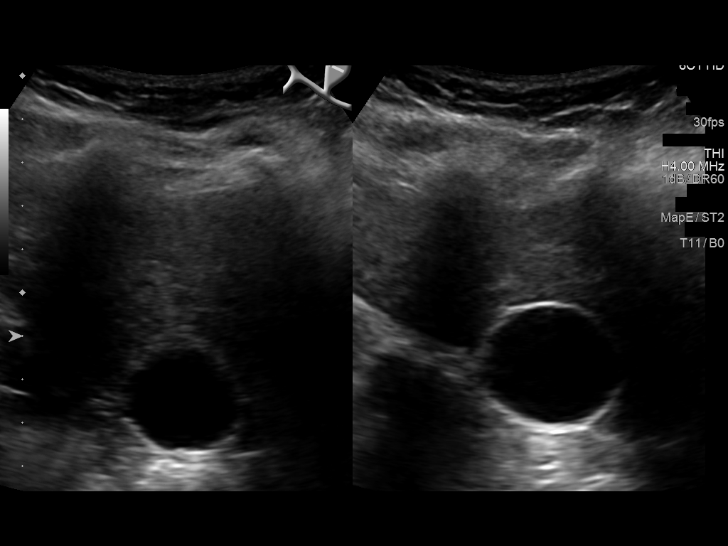
[im 7/42]
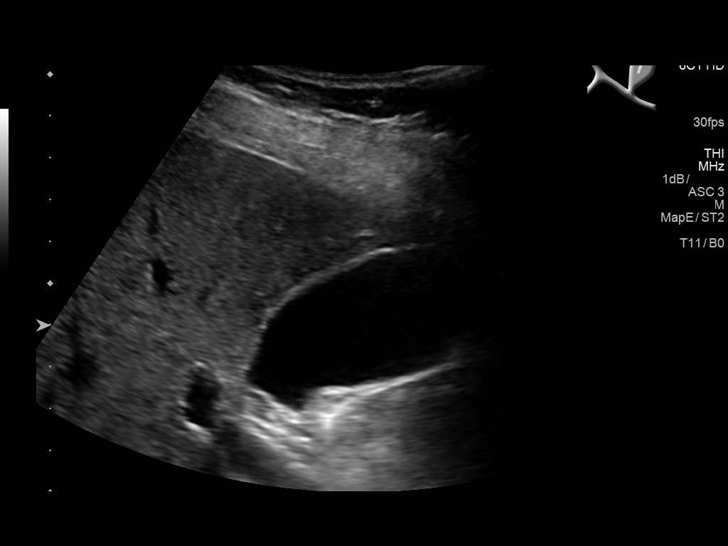
[im 11/42]
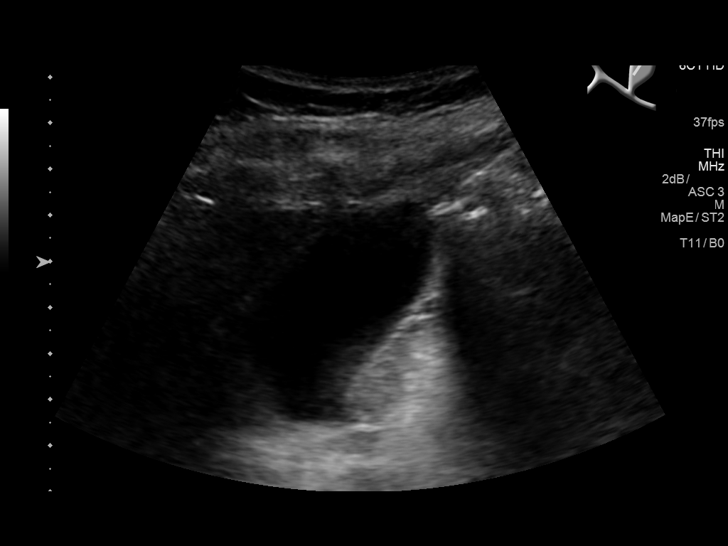
[im 14/42]
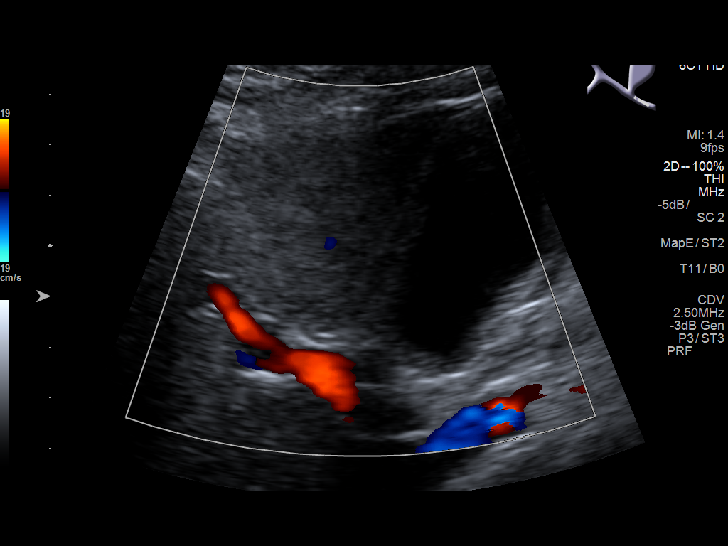
[im 16/42]
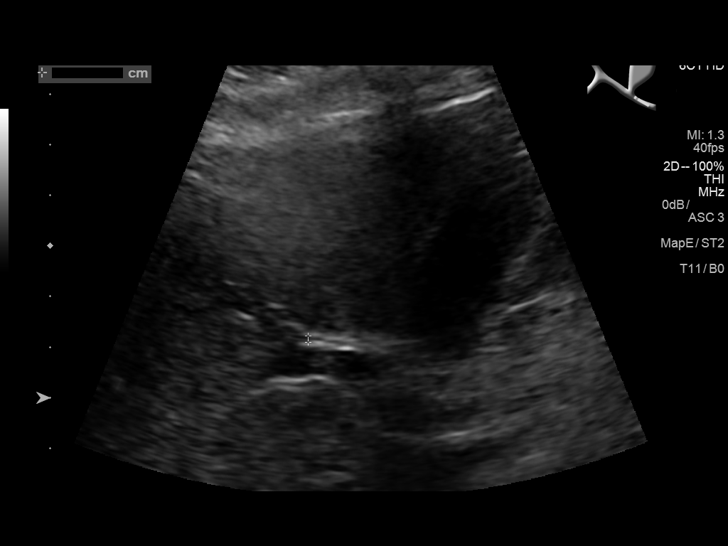
[im 19/42]
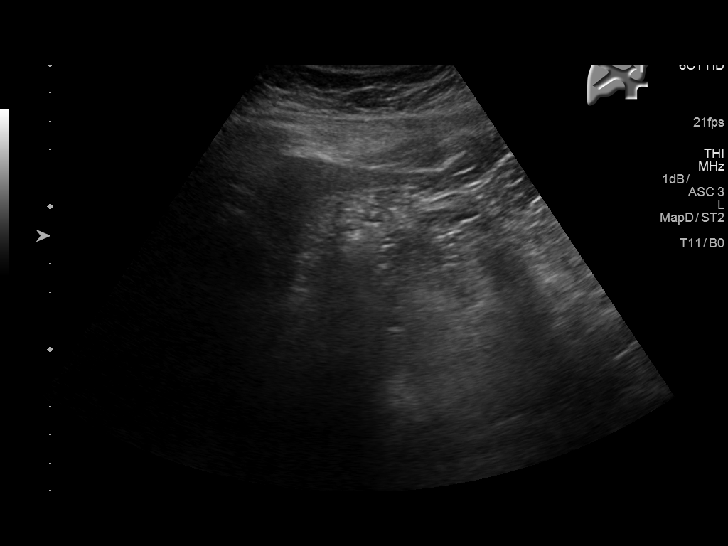
[im 23/42]
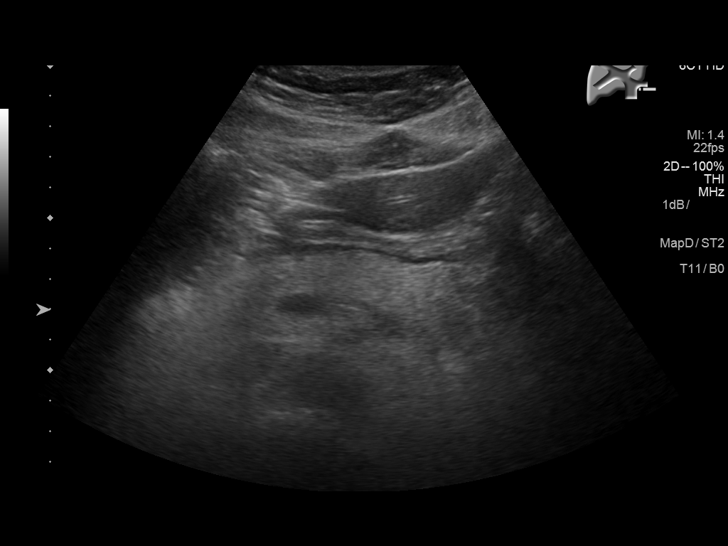
[im 26/42]
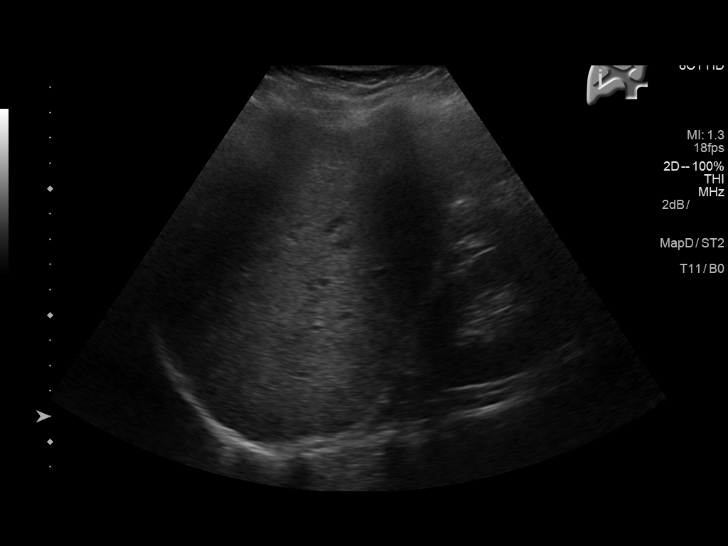
[im 28/42]
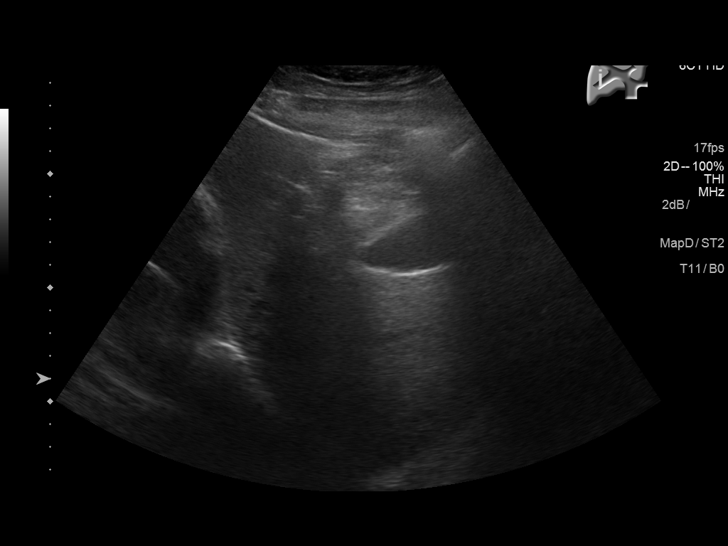
[im 31/42]
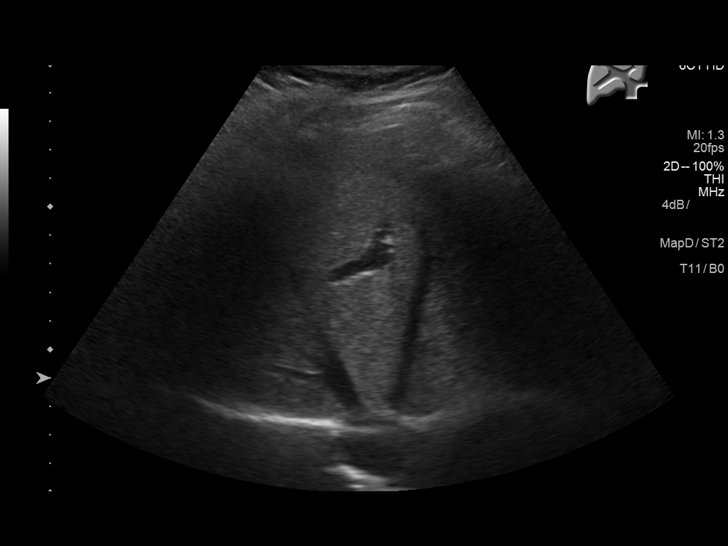
[im 35/42]
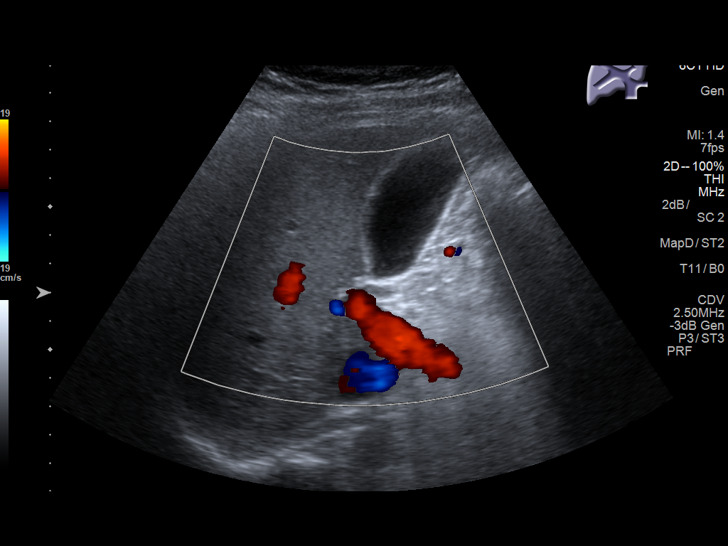
[im 38/42]
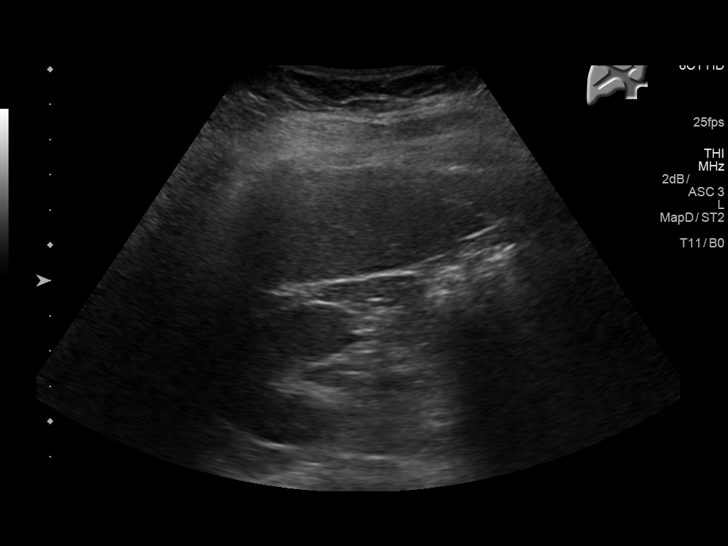
[im 42/42]
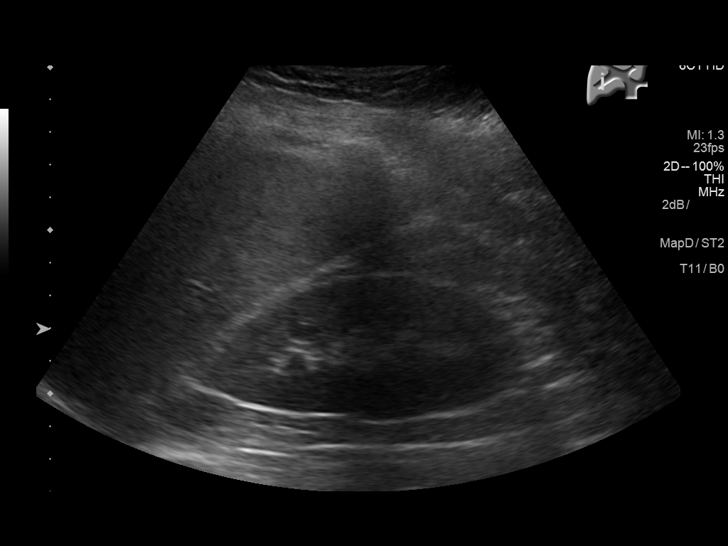

[14 of 25 positions shown; findings below may reference images not displayed]

FINDINGS: Gallbladder:

No gallstones or wall thickening visualized. No sonographic Murphy
sign noted by sonographer.

Common bile duct:

Diameter: 1.4 mm

Liver:

Increased parenchymal echogenicity.  No mass or focal lesion.
IMPRESSION: 1. No acute findings.  Normal gallbladder.  No bile duct dilation.
2. Hepatic steatosis, similar to the prior ultrasound.

## 2017-09-13 ENCOUNTER — Telehealth: Payer: Self-pay | Admitting: Family Medicine

## 2017-09-13 NOTE — Telephone Encounter (Signed)
Please contact patient I received a note from her pharmacy benefits manager about her having prescription for ranitidine and omeprazole Please ask her to return for a f/u appt with me so we can figure out next steps, OR if she already wishes to see ENT or pulmonary, please make that referral Thank you

## 2017-09-14 NOTE — Telephone Encounter (Signed)
Called pt no answer. LM for pt informing her of the information below as well as the need to call back to clarify what medications she is taking as well as if sx have improved. CRM created.

## 2017-09-15 ENCOUNTER — Telehealth: Payer: Self-pay | Admitting: Family Medicine

## 2017-09-15 MED ORDER — OMEPRAZOLE 20 MG PO CPDR: 20.0000 mg | DELAYED_RELEASE_CAPSULE | Freq: Every day | ORAL | 2 refills | Status: DC

## 2017-09-15 NOTE — Telephone Encounter (Signed)
Left detailed voicemail on preferred phone

## 2017-09-15 NOTE — Telephone Encounter (Signed)
Let's continue the omeprazole once a day Keep [place] posted if cough comes back Thank you

## 2017-09-15 NOTE — Telephone Encounter (Signed)
Margery phoned back to say she is feeling all better, no more cough and she would like to know if Dr. Sanda Klein wants her to take the omeprasole?  Please advise.

## 2017-10-17 ENCOUNTER — Ambulatory Visit (INDEPENDENT_AMBULATORY_CARE_PROVIDER_SITE_OTHER): Payer: BLUE CROSS/BLUE SHIELD | Admitting: Family Medicine

## 2017-10-17 ENCOUNTER — Encounter: Payer: Self-pay | Admitting: Family Medicine

## 2017-10-17 VITALS — BP 100/70 | HR 102 | Temp 98.4°F | Resp 14 | Wt 168.5 lb

## 2017-10-17 DIAGNOSIS — R110 Nausea: Secondary | ICD-10-CM

## 2017-10-17 DIAGNOSIS — R1011 Right upper quadrant pain: Secondary | ICD-10-CM

## 2017-10-17 DIAGNOSIS — R1013 Epigastric pain: Secondary | ICD-10-CM

## 2017-10-17 DIAGNOSIS — J069 Acute upper respiratory infection, unspecified: Secondary | ICD-10-CM

## 2017-10-17 DIAGNOSIS — N950 Postmenopausal bleeding: Secondary | ICD-10-CM | POA: Insufficient documentation

## 2017-10-17 NOTE — Progress Notes (Signed)
Name: Regina Dunlap   MRN: [id number]    DOB: [birth year]   Date:10/17/2017       Progress Note  Subjective  Chief Complaint  Chief Complaint  Patient presents with  . Influenza    has symptoms of flu    HPI  URI : she states symptoms started 3 days ago, initially clear rhinorrhea, nasal congestion and frontal pressure. She states she had a fever the first night 101, took Aleve and temperature improved. Taking Aleve prn, last dose yesterday and no fever today. She states no cough, wheezing, SOB, post-nasal drainage. She had body aches initially but that has resolved. She is still very tired, but overall symptoms have improved.   RUQ pain: she states symptoms started yesterday with nausea, prior to breakfast. She has also noticed RUQ pain, intermittent, associated with bloating, decrease appetite, no change in bowel movements or vomiting. Tried gas-X, rollaids, ginter ale and nothing is helping. No fever since the symptoms started. No blood in stools. She had some shoulder pain yesterday. No SOB or decrease in exercise tolerance.  Patient Active Problem List   Diagnosis Date Noted  . Postmenopausal bleeding 10/17/2017  . Varicose veins of both lower extremities without ulcer or inflammation 04/27/2017  . Sleep apnea 04/24/2017  . Preventative health care 04/24/2017  . Obesity (BMI 30.0-34.9) 11/04/2016  . Fatty liver 11/04/2016  . History of concussion 06/07/2016  . MCI (mild cognitive impairment) 06/07/2016  . Medication monitoring encounter 03/28/2016  . Prediabetes 03/28/2016  . Memory impairment 03/28/2016  . Decreased sense of smell 03/28/2016  . Hypercholesterolemia with hypertriglyceridemia 10/13/2015  . Encounter for screening mammogram for malignant neoplasm of breast 10/13/2015  . GERD without esophagitis 10/13/2015  . Screening for STD (sexually transmitted disease) 10/13/2015  . Allergic rhinitis 09/16/2015  . Lipoma of arm 09/16/2015  . Family history of colon  cancer 09/16/2015  . Hypertension goal BP (blood pressure) < 140/90     Social History   Tobacco Use  . Smoking status: Never Smoker  . Smokeless tobacco: Never Used  Substance Use Topics  . Alcohol use: Yes    Comment: social     Current Outpatient Medications:  .  aspirin 81 MG tablet, Take 81 mg by mouth daily., Disp: , Rfl:  .  Aspirin-Calcium Carbonate (BAYER WOMENS) [id number] MG TABS, Take by mouth., Disp: , Rfl:  .  chlorthalidone (HYGROTON) 25 MG tablet, TAKE 1 TABLET (25 MG TOTAL) BY MOUTH DAILY., Disp: 30 tablet, Rfl: 6 .  fluticasone (FLONASE) 50 MCG/ACT nasal spray, Place 2 sprays into both nostrils daily. (Patient taking differently: Place 2 sprays into both nostrils as needed. ), Disp: 18 g, Rfl: 9 .  hyoscyamine (LEVSIN, ANASPAZ) 0.125 MG tablet, Take 1 tablet (0.125 mg total) by mouth every 6 (six) hours as needed. (Patient not taking: Reported on 08/03/2017), Disp: 60 tablet, Rfl: 2 .  Krill Oil 1000 MG CAPS, Take 1 capsule daily by mouth. , Disp: , Rfl:  .  loratadine (CLARITIN) 10 MG tablet, Take 1 tablet (10 mg total) by mouth daily as needed for allergies., Disp: 30 tablet, Rfl: 11 .  losartan (COZAAR) 100 MG tablet, Take 1 tablet (100 mg total) by mouth daily., Disp: 90 tablet, Rfl: 2 .  Magnesium 100 MG CAPS, Take 1,000 mg by mouth., Disp: , Rfl:  .  Multiple Minerals-Vitamins (CALCIUM-MAGNESIUM-ZINC-D3) TABS, Take 2 tablets by mouth daily., Disp: , Rfl:  .  nortriptyline (PAMELOR) 10 MG capsule, Take 10-20mg  nightly as  directed, Disp: , Rfl:  .  omega-3 acid ethyl esters (LOVAZA) 1 g capsule, Take 1 g daily by mouth. , Disp: , Rfl:  .  omeprazole (PRILOSEC) 20 MG capsule, Take 1 capsule (20 mg total) by mouth daily., Disp: 30 capsule, Rfl: 2 .  potassium chloride (KLOR-CON 10) 10 MEQ tablet, Take 1 tablet (10 mEq total) by mouth 2 (two) times a week., Disp: 9 tablet, Rfl: 5 .  ranitidine (ZANTAC) 300 MG tablet, TAKE 1 TABLET (300 MG TOTAL) BY MOUTH AT BEDTIME. FOR  HEARTBURN, REFLUX (Patient taking differently: Take 300 mg as needed by mouth. For heartburn, reflux), Disp: 30 tablet, Rfl: 11 .  simvastatin (ZOCOR) 40 MG tablet, Take 1 tablet (40 mg total) by mouth at bedtime., Disp: 90 tablet, Rfl: 2 .  TURMERIC PO, Take by mouth daily. Reported on 03/28/2016, Disp: , Rfl:   Allergies  Allergen Reactions  . Ciprofloxacin   . Lisinopril   . Migraine Formula [Aspirin-Acetaminophen-Caffeine]     Midrina; broke out in rash   . Povidone Iodine Rash    ROS  Ten systems reviewed and is negative except as mentioned in HPI   Objective  Vitals:   10/17/17 1052  BP: 100/70  Pulse: (!) 102  Resp: 14  Temp: 98.4 F (36.9 C)  TempSrc: Oral  SpO2: 98%  Weight: 168 lb 8 oz (76.4 kg)    Body mass index is 30.82 kg/m.    Physical Exam  Constitutional: Patient appears well-developed and well-nourished. Obese No distress.  HEENT: head atraumatic, normocephalic, pupils equal and reactive to light, ears normal TM bilaterally, neck supple, throat within normal limits Cardiovascular: Normal rate, regular rhythm and normal heart sounds.  No murmur heard. No BLE edema. Pulmonary/Chest: Effort normal and breath sounds normal. No respiratory distress. Abdominal: Soft.  There is RUQ tenderness, normal bowel sounds, no rebound tenderness, negative CVA tenderness Psychiatric: Patient has a normal mood and affect. behavior is normal. Judgment and thought content normal.  Assessment & Plan   1. Dyspepsia  - COMPLETE METABOLIC PANEL WITH GFR - CBC with Differential/Platelet - US Abdomen Limited RUQ; Future - H. pylori breath test  2. URI, acute  Resolving.   3. RUQ pain  - COMPLETE METABOLIC PANEL WITH GFR - CBC with Differential/Platelet - US Abdomen Limited RUQ; Future  4. Nausea  - COMPLETE METABOLIC PANEL WITH GFR - CBC with Differential/Platelet - US Abdomen Limited RUQ; Future

## 2017-10-18 LAB — COMPLETE METABOLIC PANEL WITH GFR
AG RATIO: 1.5 (calc) (ref 1.0–2.5)
ALBUMIN MSPROF: 4.3 g/dL (ref 3.6–5.1)
ALT: 28 U/L (ref 6–29)
AST: 24 U/L (ref 10–35)
Alkaline phosphatase (APISO): 88 U/L (ref 33–130)
BILIRUBIN TOTAL: 0.5 mg/dL (ref 0.2–1.2)
BUN: 12 mg/dL (ref 7–25)
CALCIUM: 9.4 mg/dL (ref 8.6–10.4)
CO2: 30 mmol/L (ref 20–32)
Chloride: 98 mmol/L (ref 98–110)
Creat: 0.86 mg/dL (ref 0.50–1.05)
GFR, EST NON AFRICAN AMERICAN: 77 mL/min/{1.73_m2} (ref >=60)
GFR, Est African American: 89 mL/min/{1.73_m2} (ref >=60)
GLUCOSE: 90 mg/dL (ref 65–99)
Globulin: 2.9 g/dL (calc) (ref 1.9–3.7)
Potassium: 3.3 mmol/L — ABNORMAL LOW (ref 3.5–5.3)
Sodium: 137 mmol/L (ref 135–146)
Total Protein: 7.2 g/dL (ref 6.1–8.1)

## 2017-10-18 LAB — CBC WITH DIFFERENTIAL/PLATELET
Basophils Absolute: 39 cells/uL (ref 0–200)
Basophils Relative: 0.3 %
EOS ABS: 78 {cells}/uL (ref 15–500)
Eosinophils Relative: 0.6 %
HEMATOCRIT: 39.9 % (ref 35.0–45.0)
HEMOGLOBIN: 13.4 g/dL (ref 11.7–15.5)
LYMPHS ABS: 1768 {cells}/uL (ref 850–3900)
MCH: 29.2 pg (ref 27.0–33.0)
MCHC: 33.6 g/dL (ref 32.0–36.0)
MCV: 86.9 fL (ref 80.0–100.0)
MPV: 11.6 fL (ref 7.5–12.5)
Monocytes Relative: 6.4 %
NEUTROS ABS: 10283 {cells}/uL — AB (ref 1500–7800)
NEUTROS PCT: 79.1 %
Platelets: 243 10*3/uL (ref 140–400)
RBC: 4.59 10*6/uL (ref 3.80–5.10)
RDW: 12.7 % (ref 11.0–15.0)
Total Lymphocyte: 13.6 %
WBC mixed population: 832 cells/uL (ref 200–950)
WBC: 13 10*3/uL — ABNORMAL HIGH (ref 3.8–10.8)

## 2017-10-18 LAB — H. PYLORI BREATH TEST: H. pylori Breath Test: NOT DETECTED

## 2017-10-19 ENCOUNTER — Telehealth: Payer: Self-pay | Admitting: Family Medicine

## 2017-10-19 ENCOUNTER — Ambulatory Visit
Admission: RE | Admit: 2017-10-19 | Discharge: 2017-10-19 | Disposition: A | Discharge status: Home / Self Care | Payer: BLUE CROSS/BLUE SHIELD | Source: Ambulatory Visit | Attending: Family Medicine | Admitting: Family Medicine

## 2017-10-19 DIAGNOSIS — R1013 Epigastric pain: Secondary | ICD-10-CM | POA: Insufficient documentation

## 2017-10-19 DIAGNOSIS — R112 Nausea with vomiting, unspecified: Secondary | ICD-10-CM | POA: Diagnosis not present

## 2017-10-19 DIAGNOSIS — R110 Nausea: Secondary | ICD-10-CM | POA: Diagnosis not present

## 2017-10-19 DIAGNOSIS — R1011 Right upper quadrant pain: Secondary | ICD-10-CM | POA: Insufficient documentation

## 2017-10-19 DIAGNOSIS — R932 Abnormal findings on diagnostic imaging of liver and biliary tract: Secondary | ICD-10-CM | POA: Insufficient documentation

## 2017-10-19 NOTE — Telephone Encounter (Signed)
Copied from [place] [phone]. Topic: Inquiry >> Oct 19, 2017  9:39 AM Arletha Grippe wrote: Reason for CRM: please call pt when labs are ready. Please call [phone]

## 2017-10-20 ENCOUNTER — Other Ambulatory Visit: Payer: Self-pay | Admitting: Family Medicine

## 2017-10-20 DIAGNOSIS — R1011 Right upper quadrant pain: Secondary | ICD-10-CM

## 2017-10-21 ENCOUNTER — Encounter
Admission: RE | Admit: 2017-10-21 | Discharge: 2017-10-21 | Disposition: A | Discharge status: Home / Self Care | Payer: BLUE CROSS/BLUE SHIELD | Source: Ambulatory Visit | Attending: Family Medicine | Admitting: Family Medicine

## 2017-10-21 DIAGNOSIS — R1011 Right upper quadrant pain: Secondary | ICD-10-CM | POA: Insufficient documentation

## 2017-10-21 MED ORDER — TECHNETIUM TC 99M MEBROFENIN IV KIT
5.6600 | PACK | Freq: Once | INTRAVENOUS | Status: AC | PRN
Start: 2017-10-21 — End: 2017-10-21
  Administered 2017-10-21: 5.66 via INTRAVENOUS

## 2017-10-24 ENCOUNTER — Ambulatory Visit (INDEPENDENT_AMBULATORY_CARE_PROVIDER_SITE_OTHER): Payer: BLUE CROSS/BLUE SHIELD | Admitting: Family Medicine

## 2017-10-24 ENCOUNTER — Encounter: Payer: Self-pay | Admitting: Family Medicine

## 2017-10-24 VITALS — BP 100/60 | HR 87 | Resp 14 | Ht 62.0 in | Wt 164.7 lb

## 2017-10-24 DIAGNOSIS — E876 Hypokalemia: Secondary | ICD-10-CM

## 2017-10-24 DIAGNOSIS — E785 Hyperlipidemia, unspecified: Secondary | ICD-10-CM | POA: Diagnosis not present

## 2017-10-24 DIAGNOSIS — R1011 Right upper quadrant pain: Secondary | ICD-10-CM | POA: Diagnosis not present

## 2017-10-24 DIAGNOSIS — D72829 Elevated white blood cell count, unspecified: Secondary | ICD-10-CM

## 2017-10-24 DIAGNOSIS — R739 Hyperglycemia, unspecified: Secondary | ICD-10-CM

## 2017-10-24 DIAGNOSIS — K760 Fatty (change of) liver, not elsewhere classified: Secondary | ICD-10-CM

## 2017-10-24 NOTE — Progress Notes (Signed)
Name: Regina Dunlap   MRN: [id number]    DOB: 1961-05-28   Date:10/24/2017       Progress Note  Subjective  Chief Complaint  Chief Complaint  Patient presents with  . Abdominal Pain    HPI  RUQ: she was seen last week with symptoms of indigestion, RUQ pain, nausea, but no vomiting. She states she also had less frequent bowel movements. She was found to have elevated WBC but gallbladder US did not show stones, but she has increase in echotexture compatible with fatty liver. She continue to have symptoms and HIDA scan was done over the weekend that showed low normal ejection fraction at 38% ( normal being 33 and above) . She is feeling much better, no fever or chills, no nausea for the past 4 days, bowel movements daily for the past 3 days, she still has mild discomfort 1/10 intermittent. She states she likes to cook, but also likes healthy food ( vegetable/fruit) and avoiding red meat, not frying anything. She avoid white flour, bread, using multigrain. She states she can increase physical activity She states she will start this year by taking care of herself. She would like to hold off on referral to surgeon at this time, she has a gastroenterologist in [place], had colonoscopy 2 years and will return for follow up with them if symptoms returns.   Patient Active Problem List   Diagnosis Date Noted  . Postmenopausal bleeding 10/17/2017  . Varicose veins of both lower extremities without ulcer or inflammation 04/27/2017  . Sleep apnea 04/24/2017  . Preventative health care 04/24/2017  . Obesity (BMI 30.0-34.9) 11/04/2016  . Fatty liver 11/04/2016  . History of concussion 06/07/2016  . MCI (mild cognitive impairment) 06/07/2016  . Medication monitoring encounter 03/28/2016  . Prediabetes 03/28/2016  . Memory impairment 03/28/2016  . Decreased sense of smell 03/28/2016  . Hypercholesterolemia with hypertriglyceridemia 10/13/2015  . Encounter for screening mammogram for malignant  neoplasm of breast 10/13/2015  . GERD without esophagitis 10/13/2015  . Screening for STD (sexually transmitted disease) 10/13/2015  . Allergic rhinitis 09/16/2015  . Lipoma of arm 09/16/2015  . Family history of colon cancer 09/16/2015  . Hypertension goal BP (blood pressure) < 140/90     Social History   Tobacco Use  . Smoking status: Never Smoker  . Smokeless tobacco: Never Used  Substance Use Topics  . Alcohol use: Yes    Comment: social     Current Outpatient Medications:  .  aspirin 81 MG tablet, Take 81 mg by mouth daily., Disp: , Rfl:  .  Aspirin-Calcium Carbonate (BAYER WOMENS) [id number] MG TABS, Take by mouth., Disp: , Rfl:  .  chlorthalidone (HYGROTON) 25 MG tablet, TAKE 1 TABLET (25 MG TOTAL) BY MOUTH DAILY., Disp: 30 tablet, Rfl: 6 .  fluticasone (FLONASE) 50 MCG/ACT nasal spray, Place 2 sprays into both nostrils daily. (Patient taking differently: Place 2 sprays into both nostrils as needed. ), Disp: 18 g, Rfl: 9 .  hyoscyamine (LEVSIN, ANASPAZ) 0.125 MG tablet, Take 1 tablet (0.125 mg total) by mouth every 6 (six) hours as needed. (Patient not taking: Reported on 08/03/2017), Disp: 60 tablet, Rfl: 2 .  Krill Oil 1000 MG CAPS, Take 1 capsule daily by mouth. , Disp: , Rfl:  .  loratadine (CLARITIN) 10 MG tablet, Take 1 tablet (10 mg total) by mouth daily as needed for allergies., Disp: 30 tablet, Rfl: 11 .  losartan (COZAAR) 100 MG tablet, Take 1 tablet (100 mg total) by  mouth daily., Disp: 90 tablet, Rfl: 2 .  Magnesium 100 MG CAPS, Take 1,000 mg by mouth., Disp: , Rfl:  .  Multiple Minerals-Vitamins (CALCIUM-MAGNESIUM-ZINC-D3) TABS, Take 2 tablets by mouth daily., Disp: , Rfl:  .  nortriptyline (PAMELOR) 10 MG capsule, Take 10-20mg  nightly as directed, Disp: , Rfl:  .  omega-3 acid ethyl esters (LOVAZA) 1 g capsule, Take 1 g daily by mouth. , Disp: , Rfl:  .  omeprazole (PRILOSEC) 20 MG capsule, Take 1 capsule (20 mg total) by mouth daily., Disp: 30 capsule, Rfl: 2 .   potassium chloride (KLOR-CON 10) 10 MEQ tablet, Take 1 tablet (10 mEq total) by mouth 2 (two) times a week., Disp: 9 tablet, Rfl: 5 .  ranitidine (ZANTAC) 300 MG tablet, TAKE 1 TABLET (300 MG TOTAL) BY MOUTH AT BEDTIME. FOR HEARTBURN, REFLUX (Patient taking differently: Take 300 mg as needed by mouth. For heartburn, reflux), Disp: 30 tablet, Rfl: 11 .  simvastatin (ZOCOR) 40 MG tablet, Take 1 tablet (40 mg total) by mouth at bedtime., Disp: 90 tablet, Rfl: 2 .  TURMERIC PO, Take by mouth daily. Reported on 03/28/2016, Disp: , Rfl:   Allergies  Allergen Reactions  . Ciprofloxacin   . Lisinopril   . Migraine Formula [Aspirin-Acetaminophen-Caffeine]     Midrina; broke out in rash   . Povidone Iodine Rash    ROS  Ten systems reviewed and is negative except as mentioned in HPI   Objective  Vitals:   10/24/17 0739  BP: 100/60  Pulse: 87  Resp: 14  SpO2: 98%  Weight: 164 lb 11.2 oz (74.7 kg)  Height: 5\' 2"  (1.575 m)    Body mass index is 30.12 kg/m.    Physical Exam  Constitutional: Patient appears well-developed and well-nourished. Obese No distress.  HEENT: head atraumatic, normocephalic, pupils equal and reactive to light, neck supple, throat within normal limits Cardiovascular: Normal rate, regular rhythm and normal heart sounds.  No murmur heard. No BLE edema. Pulmonary/Chest: Effort normal and breath sounds normal. No respiratory distress. Abdominal: Soft.  There is no tenderness. Psychiatric: Patient has a normal mood and affect. behavior is normal. Judgment and thought content normal.  Recent Results (from the past 2160 hour(s))  COMPLETE METABOLIC PANEL WITH GFR     Status: Abnormal   Collection Time: 10/17/17 11:54 AM  Result Value Ref Range   Glucose, Bld 90 65 - 99 mg/dL    Comment: .            Fasting reference interval .    BUN 12 7 - 25 mg/dL   Creat 0.86 0.50 - 1.05 mg/dL    Comment: For patients >14 years of age, the reference limit for Creatinine is  approximately 13% higher for people identified as African-American. .    GFR, Est Non African American 77 > OR = 60 mL/min/1.18m2   GFR, Est African American 89 > OR = 60 mL/min/1.92m2   BUN/Creatinine Ratio NOT APPLICABLE 6 - 22 (calc)   Sodium 137 135 - 146 mmol/L   Potassium 3.3 (L) 3.5 - 5.3 mmol/L   Chloride 98 98 - 110 mmol/L   CO2 30 20 - 32 mmol/L   Calcium 9.4 8.6 - 10.4 mg/dL   Total Protein 7.2 6.1 - 8.1 g/dL   Albumin 4.3 3.6 - 5.1 g/dL   Globulin 2.9 1.9 - 3.7 g/dL (calc)   AG Ratio 1.5 1.0 - 2.5 (calc)   Total Bilirubin 0.5 0.2 - 1.2 mg/dL  Alkaline phosphatase (APISO) 88 33 - 130 U/L   AST 24 10 - 35 U/L   ALT 28 6 - 29 U/L  CBC with Differential/Platelet     Status: Abnormal   Collection Time: 10/17/17 11:54 AM  Result Value Ref Range   WBC 13.0 (H) 3.8 - 10.8 Thousand/uL   RBC 4.59 3.80 - 5.10 Million/uL   Hemoglobin 13.4 11.7 - 15.5 g/dL   HCT 39.9 35.0 - 45.0 %   MCV 86.9 80.0 - 100.0 fL   MCH 29.2 27.0 - 33.0 pg   MCHC 33.6 32.0 - 36.0 g/dL   RDW 12.7 11.0 - 15.0 %   Platelets 243 140 - 400 Thousand/uL   MPV 11.6 7.5 - 12.5 fL   Neutro Abs 10,283 (H) 1,500 - 7,800 cells/uL   Lymphs Abs 1,768 850 - 3,900 cells/uL   WBC mixed population 832 200 - 950 cells/uL   Eosinophils Absolute 78 15 - 500 cells/uL   Basophils Absolute 39 0 - 200 cells/uL   Neutrophils Relative % 79.1 %   Total Lymphocyte 13.6 %   Monocytes Relative 6.4 %   Eosinophils Relative 0.6 %   Basophils Relative 0.3 %  H. pylori breath test     Status: None   Collection Time: 10/17/17 11:54 AM  Result Value Ref Range   H. pylori Breath Test NOT DETECTED NOT DETECT    Comment: . Antimicrobials, proton pump inhibitors, and bismuth preparations are known to suppress H. pylori, and  ingestion of these prior to H. pylori diagnostic testing may lead to false negative results. If clinically  indicated, the test may be repeated on a new specimen obtained two weeks after discontinuing  treatment. However, a positive result is still clinically valid.      Assessment & Plan  1. RUQ pain  Likely from fatty liver, versus secondary to low ejection fraction on HIDA scan  2. Leukocytosis, unspecified type  - CBC  3. Hypokalemia  - Potassium  4. Fatty liver  She wants to try life style modification   5. Hyperglycemia  - Hemoglobin A1c - Insulin, random  6. Dyslipidemia  - Lipid panel

## 2017-10-25 DIAGNOSIS — E785 Hyperlipidemia, unspecified: Secondary | ICD-10-CM | POA: Diagnosis not present

## 2017-10-25 DIAGNOSIS — E876 Hypokalemia: Secondary | ICD-10-CM | POA: Diagnosis not present

## 2017-10-25 DIAGNOSIS — D72829 Elevated white blood cell count, unspecified: Secondary | ICD-10-CM | POA: Diagnosis not present

## 2017-10-25 DIAGNOSIS — R739 Hyperglycemia, unspecified: Secondary | ICD-10-CM | POA: Diagnosis not present

## 2017-10-26 LAB — LIPID PANEL
CHOL/HDL RATIO: 3.3 (calc) (ref <5.0)
Cholesterol: 164 mg/dL (ref <200)
HDL: 50 mg/dL — ABNORMAL LOW (ref >50)
LDL CHOLESTEROL (CALC): 92 mg/dL
NON-HDL CHOLESTEROL (CALC): 114 mg/dL (ref <130)
TRIGLYCERIDES: 128 mg/dL (ref <150)

## 2017-10-26 LAB — POTASSIUM: Potassium: 3.1 mmol/L — ABNORMAL LOW (ref 3.5–5.3)

## 2017-10-26 LAB — HEMOGLOBIN A1C
EAG (MMOL/L): 6.6 (calc)
Hgb A1c MFr Bld: 5.8 % of total Hgb — ABNORMAL HIGH (ref <5.7)
Mean Plasma Glucose: 120 (calc)

## 2017-10-26 LAB — CBC
HCT: 39.3 % (ref 35.0–45.0)
HEMOGLOBIN: 13.3 g/dL (ref 11.7–15.5)
MCH: 29.2 pg (ref 27.0–33.0)
MCHC: 33.8 g/dL (ref 32.0–36.0)
MCV: 86.4 fL (ref 80.0–100.0)
MPV: 10.2 fL (ref 7.5–12.5)
Platelets: 271 10*3/uL (ref 140–400)
RBC: 4.55 10*6/uL (ref 3.80–5.10)
RDW: 12.7 % (ref 11.0–15.0)
WBC: 5.1 10*3/uL (ref 3.8–10.8)

## 2017-10-26 LAB — INSULIN, RANDOM: Insulin: 12.7 u[IU]/mL (ref 2.0–19.6)

## 2017-11-12 NOTE — Progress Notes (Signed)
Closing out lab/order note open since:  06/13/17

## 2017-11-23 ENCOUNTER — Other Ambulatory Visit: Payer: Self-pay | Admitting: Family Medicine

## 2017-11-23 DIAGNOSIS — E876 Hypokalemia: Secondary | ICD-10-CM

## 2017-11-23 MED ORDER — POTASSIUM CHLORIDE ER 10 MEQ PO TBCR: 10.0000 meq | EXTENDED_RELEASE_TABLET | ORAL | 1 refills | Status: DC

## 2017-11-23 NOTE — Telephone Encounter (Signed)
Called pt informed her of the information below per Dr.Lada. Pt gave verbal understanding. Orders placed.

## 2017-11-23 NOTE — Telephone Encounter (Signed)
Please ask patient to take the potassium 3 days a week and recheck her BMP in 2 weeks (please order) Thank you

## 2017-11-26 ENCOUNTER — Other Ambulatory Visit: Payer: Self-pay | Admitting: Family Medicine

## 2017-12-14 ENCOUNTER — Other Ambulatory Visit: Payer: Self-pay

## 2017-12-14 ENCOUNTER — Telehealth: Payer: Self-pay

## 2017-12-14 DIAGNOSIS — E876 Hypokalemia: Secondary | ICD-10-CM | POA: Diagnosis not present

## 2017-12-14 LAB — BASIC METABOLIC PANEL
BUN: 17 mg/dL (ref 7–25)
CO2: 30 mmol/L (ref 20–32)
CREATININE: 0.85 mg/dL (ref 0.50–1.05)
Calcium: 9.1 mg/dL (ref 8.6–10.4)
Chloride: 106 mmol/L (ref 98–110)
Glucose, Bld: 97 mg/dL (ref 65–139)
POTASSIUM: 3.8 mmol/L (ref 3.5–5.3)
Sodium: 139 mmol/L (ref 135–146)

## 2017-12-14 NOTE — Telephone Encounter (Signed)
-----   Message from Arnetha Courser, MD sent at 12/14/2017  4:32 PM EDT ----- Please let pt know that her labs look great. Potassium is normal. Thank you

## 2017-12-14 NOTE — Telephone Encounter (Signed)
Called pt informed her of the information below. Pt gave verbal understanding, advised her to continue Klor Con per Dr.Lada, avoid salt substitutes as well as fruit smoothies.

## 2017-12-19 ENCOUNTER — Other Ambulatory Visit: Payer: Self-pay | Admitting: Family Medicine

## 2017-12-19 NOTE — Telephone Encounter (Signed)
Last lipids and sgpt reviewed; Rx approved 

## 2018-01-29 ENCOUNTER — Ambulatory Visit: Payer: BLUE CROSS/BLUE SHIELD | Admitting: Family Medicine

## 2018-02-13 ENCOUNTER — Ambulatory Visit: Payer: BLUE CROSS/BLUE SHIELD | Admitting: Family Medicine

## 2018-03-03 ENCOUNTER — Other Ambulatory Visit: Payer: Self-pay | Admitting: Family Medicine

## 2018-03-19 ENCOUNTER — Encounter: Payer: Self-pay | Admitting: Family Medicine

## 2018-03-19 ENCOUNTER — Ambulatory Visit (INDEPENDENT_AMBULATORY_CARE_PROVIDER_SITE_OTHER): Payer: BLUE CROSS/BLUE SHIELD | Admitting: Family Medicine

## 2018-03-19 VITALS — BP 118/72 | HR 100 | Temp 98.1°F | Resp 16 | Ht 62.0 in | Wt 169.2 lb

## 2018-03-19 DIAGNOSIS — E782 Mixed hyperlipidemia: Secondary | ICD-10-CM | POA: Diagnosis not present

## 2018-03-19 DIAGNOSIS — I10 Essential (primary) hypertension: Secondary | ICD-10-CM

## 2018-03-19 DIAGNOSIS — E876 Hypokalemia: Secondary | ICD-10-CM

## 2018-03-19 DIAGNOSIS — R7303 Prediabetes: Secondary | ICD-10-CM

## 2018-03-19 DIAGNOSIS — E669 Obesity, unspecified: Secondary | ICD-10-CM

## 2018-03-19 DIAGNOSIS — Z5181 Encounter for therapeutic drug level monitoring: Secondary | ICD-10-CM | POA: Diagnosis not present

## 2018-03-19 MED ORDER — HYOSCYAMINE SULFATE 0.125 MG PO TABS: 0.1250 mg | ORAL_TABLET | Freq: Four times a day (QID) | ORAL | 2 refills | Status: DC | PRN

## 2018-03-19 NOTE — Assessment & Plan Note (Signed)
Offered referral to nutritionist; she politely declined; she'll continue healthy eating and try to lose weight on her own

## 2018-03-19 NOTE — Assessment & Plan Note (Signed)
The lovaza was prohibitively expensive; she'll continue krill oil and statin; check fasting lipids on or after 04/24/2018

## 2018-03-19 NOTE — Assessment & Plan Note (Signed)
Healthy eating, weight loss; check glucose (fasting) and A1c on or after 04/24/2018

## 2018-03-19 NOTE — Progress Notes (Signed)
BP 118/72 (BP Location: Right Arm, Patient Position: Sitting, Cuff Size: Large)   Pulse 100   Temp 98.1 F (36.7 C) (Oral)   Resp 16   Ht 5\' 2"  (1.575 m)   Wt 169 lb 3.2 oz (76.7 kg)   SpO2 97%   BMI 30.95 kg/m    Subjective:    Patient ID: Regina Dunlap, female    DOB: 1960-12-21, 57 y.o.   MRN: [id number]  HPI: Regina Dunlap is a 57 y.o. female  Chief Complaint  Patient presents with  . Follow-up    3 month recheck    HPI Patient is here for routine f/u  She has high blood pressure On chlorthalidone and losartan BP is well-controlled  She has high cholesterol On simvastatin; the lovaza is too expensive; taking krill oil two capsules a day Trying to watch a really good diet; she had a stay-cation, doing yard work, eating healthy foods  She is obese; gained a little weight since last visit; cut back on carbs; less rice, but does eat brown rice; eating quinoa and chia seeds; puts flax seed and chia seeds in her yogurt for breakfast; she is using creamer, drinking almond milk; less creamer and more almond milk  Prediabetes; last A1c was 5.8; no dry mouth; no one in the family has diabetes; has been trying bitter melon to help with sugar and blood pressure; using that for smoothies with kale and spinach  Hx of low K+; was 3.2, 3.3, 3.1; now back up to 3.8; more kiwifruit, avocados, bananas (had some throat itching with non-organic); organic bananas are okay  No leg swelling; she has some intestinal spasms; sometimes like pressure  Depression screen [facility] 2/9 03/19/2018 08/03/2017 04/27/2017 04/24/2017 11/04/2016  Decreased Interest 0 0 0 0 0  Down, Depressed, Hopeless 0 0 0 0 0  PHQ - 2 Score 0 0 0 0 0  Altered sleeping 0 - - - -  Tired, decreased energy 0 - - - -  Change in appetite 0 - - - -  Feeling bad or failure about yourself  0 - - - -  Trouble concentrating 0 - - - -  Moving slowly or fidgety/restless 0 - - - -  Suicidal thoughts 0 - - - -  PHQ-9  Score 0 - - - -  Difficult doing work/chores Not difficult at all - - - -    Relevant past medical, surgical, family and social history reviewed Past Medical History:  Diagnosis Date  . Esophageal reflux   . Fatty liver 11/04/2016  . Mastodynia   . Obstructive sleep apnea (adult) (pediatric)   . Other and unspecified hyperlipidemia   . Overweight (BMI 25.0-29.9) 11/04/2016  . Pain in thoracic spine   . Prediabetes 03/28/2016  . Unspecified essential hypertension   . Unspecified sleep apnea    Past Surgical History:  Procedure Laterality Date  . BREAST BIOPSY Right 1999   neg  . BREAST CYST ASPIRATION Right    neg  . TUBAL LIGATION     Family History  Problem Relation Age of Onset  . Migraines Mother   . Stroke Father   . Hypertension Father   . Hypertension Sister   . Anuerysm Brother   . Hypertension Sister   . Cancer Sister        cervical  . Cancer Sister        colon   Social History   Tobacco Use  . Smoking status: Never  Smoker  . Smokeless tobacco: Never Used  Substance Use Topics  . Alcohol use: Yes    Comment: social  . Drug use: No   Interim medical history since last visit reviewed. Allergies and medications reviewed  Review of Systems Per HPI unless specifically indicated above     Objective:    BP 118/72 (BP Location: Right Arm, Patient Position: Sitting, Cuff Size: Large)   Pulse 100   Temp 98.1 F (36.7 C) (Oral)   Resp 16   Ht 5\' 2"  (1.575 m)   Wt 169 lb 3.2 oz (76.7 kg)   SpO2 97%   BMI 30.95 kg/m   Wt Readings from Last 3 Encounters:  03/19/18 169 lb 3.2 oz (76.7 kg)  10/24/17 164 lb 11.2 oz (74.7 kg)  10/17/17 168 lb 8 oz (76.4 kg)    Physical Exam  Constitutional: She appears well-developed and well-nourished. No distress.  HENT:  Head: Normocephalic and atraumatic.  Eyes: EOM are normal. No scleral icterus.  Neck: No thyromegaly present.  Cardiovascular: Normal rate and regular rhythm.  No murmur heard. Pulmonary/Chest:  Effort normal and breath sounds normal.  Abdominal: Soft. Bowel sounds are normal. She exhibits no distension.  Musculoskeletal: Normal range of motion. She exhibits no edema.  Neurological: She is alert.  Skin: Skin is warm and dry. She is not diaphoretic. No pallor.  Psychiatric: She has a normal mood and affect. Her behavior is normal. Judgment and thought content normal.    Results for orders placed or performed in visit on 33/82/50  Basic metabolic panel  Result Value Ref Range   Glucose, Bld 97 65 - 139 mg/dL   BUN 17 7 - 25 mg/dL   Creat 0.85 0.50 - 1.05 mg/dL   BUN/Creatinine Ratio NOT APPLICABLE 6 - 22 (calc)   Sodium 139 135 - 146 mmol/L   Potassium 3.8 3.5 - 5.3 mmol/L   Chloride 106 98 - 110 mmol/L   CO2 30 20 - 32 mmol/L   Calcium 9.1 8.6 - 10.4 mg/dL      Assessment & Plan:   Problem List Items Addressed This Visit      Cardiovascular and Mediastinum   Hypertension goal BP (blood pressure) < 140/90    Controlled today; healthy diet; continue meds; recheck K+ and Cr in late July        Other   Medication monitoring encounter - Primary   Relevant Orders   COMPLETE METABOLIC PANEL WITH GFR   Prediabetes    Healthy eating, weight loss; check glucose (fasting) and A1c on or after 04/24/2018      Relevant Orders   Hemoglobin A1c   Obesity (BMI 30.0-34.9)    Offered referral to nutritionist; she politely declined; she'll continue healthy eating and try to lose weight on her own      Hypokalemia    Diuretic-induced; continue healthy diet; check labs in late July      Relevant Orders   COMPLETE METABOLIC PANEL WITH GFR   Hypercholesterolemia with hypertriglyceridemia    The lovaza was prohibitively expensive; she'll continue krill oil and statin; check fasting lipids on or after 04/24/2018      Relevant Orders   Lipid panel       Follow up plan: Return in about 5 weeks (around 04/25/2018) for fasting labs; visit with Dr. Sanda Klein 6 months.  An after-visit  summary was printed and given to the patient at [place].  Please see the patient instructions which may contain other information  and recommendations beyond what is mentioned above in the assessment and plan.  Meds ordered this encounter  Medications  . hyoscyamine (LEVSIN, ANASPAZ) 0.125 MG tablet    Sig: Take 1 tablet (0.125 mg total) by mouth every 6 (six) hours as needed.    Dispense:  30 tablet    Refill:  2    Orders Placed This Encounter  Procedures  . COMPLETE METABOLIC PANEL WITH GFR  . Hemoglobin A1c  . Lipid panel

## 2018-03-19 NOTE — Assessment & Plan Note (Signed)
Controlled today; healthy diet; continue meds; recheck K+ and Cr in late July

## 2018-03-19 NOTE — Assessment & Plan Note (Signed)
Diuretic-induced; continue healthy diet; check labs in late July

## 2018-03-19 NOTE — Patient Instructions (Addendum)
Check out the information at familydoctor.org entitled "Nutrition for Weight Loss: What You Need to Know about Fad Diets" Try to lose between 1-2 pounds per week by taking in fewer calories and burning off more calories You can succeed by limiting portions, limiting foods dense in calories and fat, becoming more active, and drinking 8 glasses of water a day (64 ounces) Don't skip meals, especially breakfast, as skipping meals may alter your metabolism Do not use over-the-counter weight loss pills or gimmicks that claim rapid weight loss A healthy BMI (or body mass index) is between 18.5 and 24.9 You can calculate your ideal BMI at the [place] website [employer]fr You can use the hyoscyamine as needed; let me know if your symptoms don't resolve Return for fasting labs on or after April 25, 2018

## 2018-05-30 ENCOUNTER — Other Ambulatory Visit: Payer: Self-pay | Admitting: Family Medicine

## 2018-05-30 NOTE — Telephone Encounter (Signed)
Last OV 03/19/18

## 2018-06-08 ENCOUNTER — Other Ambulatory Visit: Payer: Self-pay | Admitting: Family Medicine

## 2018-06-11 NOTE — Telephone Encounter (Signed)
Left voice mail

## 2018-06-11 NOTE — Telephone Encounter (Signed)
Please remind patient of the labs ordered in June that were due at the end of July I'll send refill Thank you

## 2018-06-13 ENCOUNTER — Other Ambulatory Visit: Payer: Self-pay | Admitting: Family Medicine

## 2018-06-13 NOTE — Telephone Encounter (Signed)
Left voice mail

## 2018-06-13 NOTE — Telephone Encounter (Signed)
Please remind patient that she was due for labs at the end of July; please get labs done I'll send in one week of medicine

## 2018-06-22 ENCOUNTER — Encounter: Payer: Self-pay | Admitting: Nurse Practitioner

## 2018-06-22 ENCOUNTER — Other Ambulatory Visit: Payer: Self-pay | Admitting: Family Medicine

## 2018-06-22 ENCOUNTER — Ambulatory Visit (INDEPENDENT_AMBULATORY_CARE_PROVIDER_SITE_OTHER): Payer: BLUE CROSS/BLUE SHIELD | Admitting: Nurse Practitioner

## 2018-06-22 ENCOUNTER — Other Ambulatory Visit: Payer: Self-pay

## 2018-06-22 VITALS — BP 110/90 | HR 85 | Temp 98.3°F | Resp 16 | Ht 62.0 in | Wt 170.5 lb

## 2018-06-22 DIAGNOSIS — R6889 Other general symptoms and signs: Secondary | ICD-10-CM | POA: Diagnosis not present

## 2018-06-22 DIAGNOSIS — E876 Hypokalemia: Secondary | ICD-10-CM | POA: Diagnosis not present

## 2018-06-22 DIAGNOSIS — Z5181 Encounter for therapeutic drug level monitoring: Secondary | ICD-10-CM | POA: Diagnosis not present

## 2018-06-22 DIAGNOSIS — E782 Mixed hyperlipidemia: Secondary | ICD-10-CM | POA: Diagnosis not present

## 2018-06-22 DIAGNOSIS — R7303 Prediabetes: Secondary | ICD-10-CM | POA: Diagnosis not present

## 2018-06-22 DIAGNOSIS — R51 Headache: Secondary | ICD-10-CM

## 2018-06-22 DIAGNOSIS — Z1231 Encounter for screening mammogram for malignant neoplasm of breast: Secondary | ICD-10-CM | POA: Diagnosis not present

## 2018-06-22 DIAGNOSIS — R0981 Nasal congestion: Secondary | ICD-10-CM

## 2018-06-22 DIAGNOSIS — R448 Other symptoms and signs involving general sensations and perceptions: Secondary | ICD-10-CM

## 2018-06-22 DIAGNOSIS — Z1239 Encounter for other screening for malignant neoplasm of breast: Secondary | ICD-10-CM

## 2018-06-22 DIAGNOSIS — R519 Headache, unspecified: Secondary | ICD-10-CM

## 2018-06-22 MED ORDER — FLUTICASONE PROPIONATE 50 MCG/ACT NA SUSP: 2.0000 | Freq: Every day | NASAL | 6 refills | Status: DC

## 2018-06-22 MED ORDER — POTASSIUM CHLORIDE ER 10 MEQ PO TBCR: EXTENDED_RELEASE_TABLET | ORAL | 2 refills | Status: DC

## 2018-06-22 NOTE — Progress Notes (Signed)
Rx for KCl

## 2018-06-22 NOTE — Progress Notes (Addendum)
Name: Regina Dunlap   MRN: [id number]    DOB: 1961/05/24   Date:06/22/2018       Progress Note  Subjective  Chief Complaint  Chief Complaint  Patient presents with  . Facial Pain    left    HPI 2 days of left sided headache and facial pain  Behind left eye and left cheek with nausea one time. Intermittent- states first day went away with aleve and popsicle. Came back today with some intermittent sharp pain- warm compress had some relief. Feels like she might have a sinus infection coming up but normally its bilateral.    Has had migraine headaches in the past but hasn't dealt with them in a long time. No jaw pain, or pain with chewing. Last dental procedure was left side crown placement in February.   Patient Active Problem List   Diagnosis Date Noted  . Hypokalemia 11/23/2017  . Postmenopausal bleeding 10/17/2017  . Varicose veins of both lower extremities without ulcer or inflammation 04/27/2017  . Sleep apnea 04/24/2017  . Preventative health care 04/24/2017  . Obesity (BMI 30.0-34.9) 11/04/2016  . Fatty liver 11/04/2016  . History of concussion 06/07/2016  . MCI (mild cognitive impairment) 06/07/2016  . Medication monitoring encounter 03/28/2016  . Prediabetes 03/28/2016  . Memory impairment 03/28/2016  . Decreased sense of smell 03/28/2016  . Hypercholesterolemia with hypertriglyceridemia 10/13/2015  . Encounter for screening mammogram for malignant neoplasm of breast 10/13/2015  . GERD without esophagitis 10/13/2015  . Screening for STD (sexually transmitted disease) 10/13/2015  . Allergic rhinitis 09/16/2015  . Lipoma of arm 09/16/2015  . Family history of colon cancer 09/16/2015  . Hypertension goal BP (blood pressure) < 140/90     Past Medical History:  Diagnosis Date  . Esophageal reflux   . Fatty liver 11/04/2016  . Mastodynia   . Obstructive sleep apnea (adult) (pediatric)   . Other and unspecified hyperlipidemia   . Overweight (BMI 25.0-29.9)  11/04/2016  . Pain in thoracic spine   . Prediabetes 03/28/2016  . Unspecified essential hypertension   . Unspecified sleep apnea     Past Surgical History:  Procedure Laterality Date  . BREAST BIOPSY Right 1999   neg  . BREAST CYST ASPIRATION Right    neg  . TUBAL LIGATION      Social History   Tobacco Use  . Smoking status: Never Smoker  . Smokeless tobacco: Never Used  Substance Use Topics  . Alcohol use: Yes    Comment: social     Current Outpatient Medications:  .  aspirin 81 MG tablet, Take 81 mg by mouth daily., Disp: , Rfl:  .  Aspirin-Calcium Carbonate (BAYER WOMENS) [phone] MG TABS, Take by mouth., Disp: , Rfl:  .  chlorthalidone (HYGROTON) 25 MG tablet, TAKE 1 TABLET (25 MG TOTAL) BY MOUTH DAILY., Disp: 90 tablet, Rfl: 0 .  Krill Oil 1000 MG CAPS, Take 1 capsule daily by mouth. , Disp: , Rfl:  .  loratadine (CLARITIN) 10 MG tablet, Take 1 tablet (10 mg total) by mouth daily as needed for allergies., Disp: 30 tablet, Rfl: 11 .  losartan (COZAAR) 100 MG tablet, TAKE 1 TABLET (100 MG TOTAL) BY MOUTH DAILY., Disp: 90 tablet, Rfl: 0 .  Magnesium 100 MG CAPS, Take 1,000 mg by mouth., Disp: , Rfl:  .  Multiple Minerals-Vitamins (CALCIUM-MAGNESIUM-ZINC-D3) TABS, Take 2 tablets by mouth daily., Disp: , Rfl:  .  omeprazole (PRILOSEC) 20 MG capsule, Take 1 capsule (20 mg total)  by mouth daily., Disp: 30 capsule, Rfl: 2 .  simvastatin (ZOCOR) 40 MG tablet, TAKE 1 TABLET (40 MG TOTAL) BY MOUTH AT BEDTIME., Disp: 7 tablet, Rfl: 0 .  TURMERIC PO, Take by mouth daily. Reported on 03/28/2016, Disp: , Rfl:  .  fluticasone (FLONASE) 50 MCG/ACT nasal spray, Place 2 sprays into both nostrils daily., Disp: 16 g, Rfl: 6 .  nortriptyline (PAMELOR) 10 MG capsule, Take 10-20mg  nightly as directed, Disp: , Rfl:   Allergies  Allergen Reactions  . Ciprofloxacin   . Lisinopril   . Migraine Formula [Aspirin-Acetaminophen-Caffeine]     Midrina; broke out in rash   . Povidone Iodine Rash     ROS   No other specific complaints in a complete review of systems (except as listed in HPI above).  Objective  Vitals:   06/22/18 1020 06/22/18 1036  BP: 118/90 110/90  Pulse: 85   Resp: 16   Temp: 98.3 F (36.8 C)   TempSrc: Oral   SpO2: 99%   Weight: 170 lb 8 oz (77.3 kg)   Height: 5\' 2"  (1.575 m)     Body mass index is 31.18 kg/m.  Nursing Note and Vital Signs reviewed.  Physical Exam  Constitutional: She appears well-developed and well-nourished.  HENT:  Head: Normocephalic and atraumatic.  Right Ear: Hearing, tympanic membrane, external ear and ear canal normal. No tenderness (some cerumen impaction). Tympanic membrane is not scarred.  Left Ear: Hearing, tympanic membrane, external ear and ear canal normal. No tenderness. Tympanic membrane is not injected.  Nose: Right sinus exhibits no maxillary sinus tenderness and no frontal sinus tenderness. Left sinus exhibits maxillary sinus tenderness and frontal sinus tenderness.  Mouth/Throat: Uvula is midline, oropharynx is clear and moist and mucous membranes are normal.    Neurological: She is alert. No cranial nerve deficit or sensory deficit. GCS eye subscore is 4. GCS verbal subscore is 5. GCS motor subscore is 6.      No results found for this or any previous visit (from the past 48 hour(s)).  Assessment & Plan  1. Left facial pressure and pain Not present currently; considered migraine, trigeminal neuralgia, TMJ, sinus infection. Due to left maxillary tenderness and congestion likely sinus infection- discussed OTC management and reasons to return to clinic.   2. Nasal congestion - fluticasone (FLONASE) 50 MCG/ACT nasal spray; Place 2 sprays into both nostrils daily.  Dispense: 16 g; Refill: 6  3. Facial pressure - fluticasone (FLONASE) 50 MCG/ACT nasal spray; Place 2 sprays into both nostrils daily.  Dispense: 16 g; Refill: 6  4. Breast cancer screening - MM 3D SCREEN BREAST BILATERAL

## 2018-06-22 NOTE — Patient Instructions (Addendum)
-   neti pot, allergy medicine, flonase - acetaminophen for pain if needed; aleve OR Advil with this.  - Drink plenty of water; rest; good nutrition- protein and vitamin C   - If you start to get fevers over 101 or improving symptoms that suddenly get worse these can be signs of a bacterial sinus infection and let us know  - Pain is worsening or you have have new symptoms come back.    Please do call to schedule your mammogram; the number to schedule one at either [facility] or [facility] is [phone]  _____________________ Dennis Bast likely have a viral upper respiratory infection (URI). Antibiotics will not reduce the number of days you are ill or prevent you from getting bacterial rhinosinusitis. A URI can take up to 14 days to resolve, but typically last between 7-11 days. Your body is so smart and strong that it will be fighting this illness off for you but it is important that you drink plenty of fluids, rest. Cover your nose/mouth when you cough or sneeze and wash your hands well and often. Here are some helpful things you can use or pick up over the counter from the pharmacy to help with your symptoms:   For Fever/Pain: Acetaminophen every 6 hours as needed (maximum of 3000mg  a day). If you are still uncomfortable you can add ibuprofen OR naproxen  For coughing: try dextromethorphan for a cough suppressant, and/or a cool mist humidifier, lozenges  For sore throat: saline gargles, honey herbal tea, lozenges, throat spray  To dry out your nose: try an antihistamine like loratadine (non-sedating) or diphenhydramine (sedating) or others To relieve a stuffy nose: try an oral decongestant  Like pseudoephedrine if you are under the age of 20 and do not have high blood pressure, neti pot To make blowing your nose easier: guaifenesin

## 2018-06-23 LAB — COMPLETE METABOLIC PANEL WITH GFR
AG RATIO: 1.6 (calc) (ref 1.0–2.5)
ALT: 23 U/L (ref 6–29)
AST: 21 U/L (ref 10–35)
Albumin: 4.8 g/dL (ref 3.6–5.1)
Alkaline phosphatase (APISO): 97 U/L (ref 33–130)
BUN: 19 mg/dL (ref 7–25)
CALCIUM: 9.6 mg/dL (ref 8.6–10.4)
CO2: 29 mmol/L (ref 20–32)
CREATININE: 0.99 mg/dL (ref 0.50–1.05)
Chloride: 102 mmol/L (ref 98–110)
GFR, EST AFRICAN AMERICAN: 74 mL/min/{1.73_m2} (ref >=60)
GFR, EST NON AFRICAN AMERICAN: 64 mL/min/{1.73_m2} (ref >=60)
GLUCOSE: 90 mg/dL (ref 65–99)
Globulin: 3 g/dL (calc) (ref 1.9–3.7)
Potassium: 3.4 mmol/L — ABNORMAL LOW (ref 3.5–5.3)
Sodium: 140 mmol/L (ref 135–146)
TOTAL PROTEIN: 7.8 g/dL (ref 6.1–8.1)
Total Bilirubin: 0.4 mg/dL (ref 0.2–1.2)

## 2018-06-23 LAB — HEMOGLOBIN A1C
EAG (MMOL/L): 7 (calc)
Hgb A1c MFr Bld: 6 % of total Hgb — ABNORMAL HIGH (ref <5.7)
Mean Plasma Glucose: 126 (calc)

## 2018-06-23 LAB — LIPID PANEL
CHOL/HDL RATIO: 3.1 (calc) (ref <5.0)
Cholesterol: 175 mg/dL (ref <200)
HDL: 57 mg/dL (ref >50)
LDL Cholesterol (Calc): 93 mg/dL (calc)
NON-HDL CHOLESTEROL (CALC): 118 mg/dL (ref <130)
Triglycerides: 153 mg/dL — ABNORMAL HIGH (ref <150)

## 2018-06-28 ENCOUNTER — Telehealth: Payer: Self-pay

## 2018-06-28 NOTE — Telephone Encounter (Signed)
Copied from [place] [id number]. Topic: General - Other >> Jun 28, 2018  1:34 PM Yvette Rack wrote: Reason for CRM: Wells Guiles from [facility] [phone] calling stating that she had faxed over a request for pt medical supplies for the pt CPAP she states that pt doesn't need an appt for the supplies for the machine because pt already has the machine just need supplies she states that they faxed over a CMN certificate on [id number] and hasn't heard back but was told that pt needed a appt for the supplies she states that pt shouldn't need appt

## 2018-06-29 NOTE — Telephone Encounter (Signed)
We have not seen the patient for sleep apnea for over a year; there is no documentation on the chart about her sleep apnea or compliance with her machine or anything My understanding was that for DME (such as CPAP supplies), documentation needed to happen once a year Miel, can you confirm or deny and either notify them and schedule appt or let me know that this is no longer true so I can sign the form?

## 2018-07-03 NOTE — Telephone Encounter (Signed)
Pt is scheduled for 07/11/18

## 2018-07-09 ENCOUNTER — Ambulatory Visit
Admission: RE | Admit: 2018-07-09 | Discharge: 2018-07-09 | Disposition: A | Discharge status: Home / Self Care | Payer: BLUE CROSS/BLUE SHIELD | Source: Ambulatory Visit | Attending: Nurse Practitioner | Admitting: Nurse Practitioner

## 2018-07-09 DIAGNOSIS — Z1231 Encounter for screening mammogram for malignant neoplasm of breast: Secondary | ICD-10-CM | POA: Diagnosis not present

## 2018-07-09 DIAGNOSIS — Z1239 Encounter for other screening for malignant neoplasm of breast: Secondary | ICD-10-CM | POA: Diagnosis not present

## 2018-07-11 ENCOUNTER — Ambulatory Visit (INDEPENDENT_AMBULATORY_CARE_PROVIDER_SITE_OTHER): Payer: BLUE CROSS/BLUE SHIELD | Admitting: Nurse Practitioner

## 2018-07-11 ENCOUNTER — Encounter: Payer: Self-pay | Admitting: Nurse Practitioner

## 2018-07-11 VITALS — BP 104/68 | HR 67 | Temp 97.8°F | Resp 12 | Ht 62.0 in | Wt 174.0 lb

## 2018-07-11 DIAGNOSIS — Z9989 Dependence on other enabling machines and devices: Secondary | ICD-10-CM | POA: Diagnosis not present

## 2018-07-11 DIAGNOSIS — G4733 Obstructive sleep apnea (adult) (pediatric): Secondary | ICD-10-CM | POA: Diagnosis not present

## 2018-07-11 NOTE — Progress Notes (Signed)
Name: Regina Dunlap   MRN: [id number]    DOB: [birth year]   Date:07/11/2018       Progress Note  Subjective  Chief Complaint  Chief Complaint  Patient presents with  . Sleep Apnea    Cpap supplies paperwork    HPI  Sleep Apnea States has nasal pillow states used nasal mask in the past and was too claustrophobic but the pillow works well. Uses it every night. States notices a difference. States if she falls asleep on the couch wakes up and gets it. Feels refreshed when using it compared to not. No issues with machine.     Patient Active Problem List   Diagnosis Date Noted  . Hypokalemia 11/23/2017  . Postmenopausal bleeding 10/17/2017  . Varicose veins of both lower extremities without ulcer or inflammation 04/27/2017  . Sleep apnea 04/24/2017  . Preventative health care 04/24/2017  . Obesity (BMI 30.0-34.9) 11/04/2016  . Fatty liver 11/04/2016  . History of concussion 06/07/2016  . MCI (mild cognitive impairment) 06/07/2016  . Medication monitoring encounter 03/28/2016  . Prediabetes 03/28/2016  . Memory impairment 03/28/2016  . Decreased sense of smell 03/28/2016  . Hypercholesterolemia with hypertriglyceridemia 10/13/2015  . Encounter for screening mammogram for malignant neoplasm of breast 10/13/2015  . GERD without esophagitis 10/13/2015  . Screening for STD (sexually transmitted disease) 10/13/2015  . Allergic rhinitis 09/16/2015  . Lipoma of arm 09/16/2015  . Family history of colon cancer 09/16/2015  . Hypertension goal BP (blood pressure) < 140/90     Past Medical History:  Diagnosis Date  . Esophageal reflux   . Fatty liver 11/04/2016  . Mastodynia   . Obstructive sleep apnea (adult) (pediatric)   . Other and unspecified hyperlipidemia   . Overweight (BMI 25.0-29.9) 11/04/2016  . Pain in thoracic spine   . Prediabetes 03/28/2016  . Unspecified essential hypertension   . Unspecified sleep apnea     Past Surgical History:  Procedure Laterality Date   . BREAST BIOPSY Right 1999   neg  . BREAST CYST ASPIRATION Right    neg  . TUBAL LIGATION      Social History   Tobacco Use  . Smoking status: Never Smoker  . Smokeless tobacco: Never Used  Substance Use Topics  . Alcohol use: Yes    Comment: social     Current Outpatient Medications:  .  aspirin 81 MG tablet, Take 81 mg by mouth daily., Disp: , Rfl:  .  Aspirin-Calcium Carbonate (BAYER WOMENS) [phone] MG TABS, Take by mouth., Disp: , Rfl:  .  chlorthalidone (HYGROTON) 25 MG tablet, TAKE 1 TABLET (25 MG TOTAL) BY MOUTH DAILY., Disp: 90 tablet, Rfl: 0 .  fluticasone (FLONASE) 50 MCG/ACT nasal spray, Place 2 sprays into both nostrils daily. (Patient taking differently: Place 2 sprays into both nostrils as needed. ), Disp: 16 g, Rfl: 6 .  Krill Oil 1000 MG CAPS, Take 1 capsule daily by mouth. , Disp: , Rfl:  .  loratadine (CLARITIN) 10 MG tablet, Take 1 tablet (10 mg total) by mouth daily as needed for allergies., Disp: 30 tablet, Rfl: 11 .  losartan (COZAAR) 100 MG tablet, TAKE 1 TABLET (100 MG TOTAL) BY MOUTH DAILY., Disp: 90 tablet, Rfl: 0 .  Magnesium 100 MG CAPS, Take 1,000 mg by mouth., Disp: , Rfl:  .  omeprazole (PRILOSEC) 20 MG capsule, Take 1 capsule (20 mg total) by mouth daily., Disp: 30 capsule, Rfl: 2 .  potassium chloride (KLOR-CON 10) 10 MEQ tablet,  One by mouth daily three days per week (Mon, Wed, Fri for example), Disp: 14 tablet, Rfl: 2 .  simvastatin (ZOCOR) 40 MG tablet, TAKE 1 TABLET (40 MG TOTAL) BY MOUTH AT BEDTIME., Disp: 7 tablet, Rfl: 0 .  TURMERIC PO, Take by mouth daily. Reported on 03/28/2016, Disp: , Rfl:  .  Multiple Minerals-Vitamins (CALCIUM-MAGNESIUM-ZINC-D3) TABS, Take 2 tablets by mouth daily., Disp: , Rfl:  .  nortriptyline (PAMELOR) 10 MG capsule, Take 10-20mg  nightly as directed, Disp: , Rfl:   Allergies  Allergen Reactions  . Ciprofloxacin   . Lisinopril   . Migraine Formula [Aspirin-Acetaminophen-Caffeine]     Midrina; broke out in rash    . Povidone Iodine Rash    ROS   No other specific complaints in a complete review of systems (except as listed in HPI above).  Objective  Vitals:   07/11/18 0949  BP: 104/68  Pulse: 67  Resp: 12  Temp: 97.8 F (36.6 C)  TempSrc: Oral  SpO2: 99%  Weight: 174 lb (78.9 kg)  Height: 5\' 2"  (1.575 m)     Body mass index is 31.83 kg/m.  Nursing Note and Vital Signs reviewed.  Physical Exam  Constitutional: She is oriented to person, place, and time. She appears well-developed and well-nourished.  HENT:  Head: Normocephalic and atraumatic.  Eyes: Conjunctivae are normal.  Cardiovascular: Normal rate.  Pulmonary/Chest: Effort normal.  Neurological: She is alert and oriented to person, place, and time. Coordination normal.  Skin: Skin is warm and dry. She is not diaphoretic. No erythema.  Psychiatric: She has a normal mood and affect. Her behavior is normal. Judgment and thought content normal.       No results found for this or any previous visit (from the past 48 hour(s)).  Assessment & Plan  1. OSA on CPAP CPAP paperwork completed.

## 2018-07-11 NOTE — Patient Instructions (Signed)
Low-Fat Diet for Pancreatitis or Gallbladder Conditions A low-fat diet can be helpful if you have pancreatitis or a gallbladder condition. With these conditions, your pancreas and gallbladder have trouble digesting fats. A healthy eating plan with less fat will help rest your pancreas and gallbladder and reduce your symptoms. What do I need to know about this diet?  Eat a low-fat diet. ? Reduce your fat intake to less than 20-30% of your total daily calories. This is less than 50-60 g of fat per day. ? Remember that you need some fat in your diet. Ask your dietician what your daily goal should be. ? Choose nonfat and low-fat healthy foods. Look for the words "nonfat," "low fat," or "fat free." ? As a guide, look on the label and choose foods with less than 3 g of fat per serving. Eat only one serving.  Avoid alcohol.  Do not smoke. If you need help quitting, talk with your health care provider.  Eat small frequent meals instead of three large heavy meals. What foods can I eat? Grains Include healthy grains and starches such as potatoes, wheat bread, fiber-rich cereal, and brown rice. Choose whole grain options whenever possible. In adults, whole grains should account for 45-65% of your daily calories. Fruits and Vegetables Eat plenty of fruits and vegetables. Fresh fruits and vegetables add fiber to your diet. Meats and Other Protein Sources Eat lean meat such as chicken and pork. Trim any fat off of meat before cooking it. Eggs, fish, and beans are other sources of protein. In adults, these foods should account for 10-35% of your daily calories. Dairy Choose low-fat milk and dairy options. Dairy includes fat and protein, as well as calcium. Fats and Oils Limit high-fat foods such as fried foods, sweets, baked goods, sugary drinks. Other Creamy sauces and condiments, such as mayonnaise, can add extra fat. Think about whether or not you need to use them, or use smaller amounts or low fat  options. What foods are not recommended?  High fat foods, such as: ? Baked goods. ? Ice cream. ? French toast. ? Sweet rolls. ? Pizza. ? Cheese bread. ? Foods covered with batter, butter, creamy sauces, or cheese. ? Fried foods. ? Sugary drinks and desserts.  Foods that cause gas or bloating This information is not intended to replace advice given to you by your health care provider. Make sure you discuss any questions you have with your health care provider. Document Released: 09/17/2013 Document Revised: 02/18/2016 Document Reviewed: 08/26/2013 Elsevier Interactive Patient Education  2017 Elsevier Inc.  

## 2018-07-15 ENCOUNTER — Other Ambulatory Visit: Payer: Self-pay | Admitting: Nurse Practitioner

## 2018-07-15 ENCOUNTER — Other Ambulatory Visit: Payer: Self-pay | Admitting: Family Medicine

## 2018-07-19 DIAGNOSIS — G4733 Obstructive sleep apnea (adult) (pediatric): Secondary | ICD-10-CM | POA: Diagnosis not present

## 2018-07-23 ENCOUNTER — Other Ambulatory Visit: Payer: Self-pay | Admitting: Family Medicine

## 2018-08-15 ENCOUNTER — Other Ambulatory Visit: Payer: Self-pay | Admitting: Family Medicine

## 2018-08-19 DIAGNOSIS — G4733 Obstructive sleep apnea (adult) (pediatric): Secondary | ICD-10-CM | POA: Diagnosis not present

## 2018-09-02 ENCOUNTER — Other Ambulatory Visit: Payer: Self-pay | Admitting: Family Medicine

## 2018-09-12 ENCOUNTER — Other Ambulatory Visit: Payer: Self-pay | Admitting: Nurse Practitioner

## 2018-09-18 ENCOUNTER — Ambulatory Visit
Admission: RE | Admit: 2018-09-18 | Discharge: 2018-09-18 | Disposition: A | Discharge status: Home / Self Care | Payer: BLUE CROSS/BLUE SHIELD | Source: Ambulatory Visit | Attending: Family Medicine | Admitting: Family Medicine

## 2018-09-18 ENCOUNTER — Encounter: Payer: Self-pay | Admitting: Family Medicine

## 2018-09-18 ENCOUNTER — Ambulatory Visit (INDEPENDENT_AMBULATORY_CARE_PROVIDER_SITE_OTHER): Payer: BLUE CROSS/BLUE SHIELD | Admitting: Family Medicine

## 2018-09-18 VITALS — BP 112/66 | HR 86 | Temp 97.8°F | Ht 62.0 in | Wt 172.9 lb

## 2018-09-18 DIAGNOSIS — M79672 Pain in left foot: Secondary | ICD-10-CM

## 2018-09-18 DIAGNOSIS — E669 Obesity, unspecified: Secondary | ICD-10-CM

## 2018-09-18 DIAGNOSIS — M722 Plantar fascial fibromatosis: Secondary | ICD-10-CM

## 2018-09-18 DIAGNOSIS — G4733 Obstructive sleep apnea (adult) (pediatric): Secondary | ICD-10-CM | POA: Diagnosis not present

## 2018-09-18 DIAGNOSIS — S99922A Unspecified injury of left foot, initial encounter: Secondary | ICD-10-CM | POA: Diagnosis not present

## 2018-09-18 NOTE — Progress Notes (Signed)
Regina Dunlap, please let the patient know that her foot exam did NOT show a fracture; we'll have her see the podiatrist

## 2018-09-18 NOTE — Assessment & Plan Note (Signed)
Encouraged weight loss 

## 2018-09-18 NOTE — Patient Instructions (Addendum)
Check out the information at familydoctor.org entitled "Nutrition for Weight Loss: What You Need to Know about Fad Diets" Try to lose between 1-2 pounds per week by taking in fewer calories and burning off more calories You can succeed by limiting portions, limiting foods dense in calories and fat, becoming more active, and drinking 8 glasses of water a day (64 ounces) Don't skip meals, especially breakfast, as skipping meals may alter your metabolism Do not use over-the-counter weight loss pills or gimmicks that claim rapid weight loss A healthy BMI (or body mass index) is between 18.5 and 24.9 You can calculate your ideal BMI at the NIH website http://www.nhlbi.nih.gov/health/educational/lose_wt/BMI/bmicalc.htm  Obesity, Adult Obesity is the condition of having too much total body fat. Being overweight or obese means that your weight is greater than what is considered healthy for your body size. Obesity is determined by a measurement called BMI. BMI is an estimate of body fat and is calculated from height and weight. For adults, a BMI of 30 or higher is considered obese. Obesity can eventually lead to other health concerns and major illnesses, including:  Stroke.  Coronary artery disease (CAD).  Type 2 diabetes.  Some types of cancer, including cancers of the colon, breast, uterus, and gallbladder.  Osteoarthritis.  High blood pressure (hypertension).  High cholesterol.  Sleep apnea.  Gallbladder stones.  Infertility problems. What are the causes? The main cause of obesity is taking in (consuming) more calories than your body uses for energy. Other factors that contribute to this condition may include:  Being born with genes that make you more likely to become obese.  Having a medical condition that causes obesity. These conditions include: ? Hypothyroidism. ? Polycystic ovarian syndrome (PCOS). ? Binge-eating disorder. ? Cushing syndrome.  Taking certain medicines, such  as steroids, antidepressants, and seizure medicines.  Not being physically active (sedentary lifestyle).  Living where there are limited places to exercise safely or buy healthy foods.  Not getting enough sleep. What increases the risk? The following factors may increase your risk of this condition:  Having a family history of obesity.  Being a woman of African-American descent.  Being a man of Hispanic descent. What are the signs or symptoms? Having excessive body fat is the main symptom of this condition. How is this diagnosed? This condition may be diagnosed based on:  Your symptoms.  Your medical history.  A physical exam. Your health care provider may measure: ? Your BMI. If you are an adult with a BMI between 25 and less than 30, you are considered overweight. If you are an adult with a BMI of 30 or higher, you are considered obese. ? The distances around your hips and your waist (circumferences). These may be compared to each other to help diagnose your condition. ? Your skinfold thickness. Your health care provider may gently pinch a fold of your skin and measure it. How is this treated? Treatment for this condition often includes changing your lifestyle. Treatment may include some or all of the following:  Dietary changes. Work with your health care provider and a dietitian to set a weight-loss goal that is healthy and reasonable for you. Dietary changes may include eating: ? Smaller portions. A portion size is the amount of a particular food that is healthy for you to eat at one time. This varies from person to person. ? Low-calorie or low-fat options. ? More whole grains, fruits, and vegetables.  Regular physical activity. This may include aerobic activity (cardio) and   strength training.  Medicine to help you lose weight. Your health care provider may prescribe medicine if you are unable to lose 1 pound a week after 6 weeks of eating more healthily and doing more  physical activity.  Surgery. Surgical options may include gastric banding and gastric bypass. Surgery may be done if: ? Other treatments have not helped to improve your condition. ? You have a BMI of 40 or higher. ? You have life-threatening health problems related to obesity. Follow these instructions at home:  Eating and drinking   Follow recommendations from your health care provider about what you eat and drink. Your health care provider may advise you to: ? Limit fast foods, sweets, and processed snack foods. ? Choose low-fat options, such as low-fat milk instead of whole milk. ? Eat 5 or more servings of fruits or vegetables every day. ? Eat at home more often. This gives you more control over what you eat. ? Choose healthy foods when you eat out. ? Learn what a healthy portion size is. ? Keep low-fat snacks on hand. ? Avoid sugary drinks, such as soda, fruit juice, iced tea sweetened with sugar, and flavored milk. ? Eat a healthy breakfast.  Drink enough water to keep your urine clear or pale yellow.  Do not go without eating for long periods of time (do not fast) or follow a fad diet. Fasting and fad diets can be unhealthy and even dangerous. Physical Activity  Exercise regularly, as told by your health care provider. Ask your health care provider what types of exercise are safe for you and how often you should exercise.  Warm up and stretch before being active.  Cool down and stretch after being active.  Rest between periods of activity. Lifestyle  Limit the time that you spend in front of your TV, computer, or video game system.  Find ways to reward yourself that do not involve food.  Limit alcohol intake to no more than 1 drink a day for nonpregnant women and 2 drinks a day for men. One drink equals 12 oz of beer, 5 oz of wine, or 1 oz of hard liquor. General instructions  Keep a weight loss journal to keep track of the food you eat and how much you exercise you  get.  Take over-the-counter and prescription medicines only as told by your health care provider.  Take vitamins and supplements only as told by your health care provider.  Consider joining a support group. Your health care provider may be able to recommend a support group.  Keep all follow-up visits as told by your health care provider. This is important. Contact a health care provider if:  You are unable to meet your weight loss goal after 6 weeks of dietary and lifestyle changes. This information is not intended to replace advice given to you by your health care provider. Make sure you discuss any questions you have with your health care provider. Document Released: 10/20/2004 Document Revised: 02/15/2016 Document Reviewed: 07/01/2015 Elsevier Interactive Patient Education  2019 Elsevier Inc.  Preventing Unhealthy Weight Gain, Adult Staying at a healthy weight is important to your overall health. When fat builds up in your body, you may become overweight or obese. Being overweight or obese increases your risk of developing certain health problems, such as heart disease, diabetes, sleeping problems, joint problems, and some types of cancer. Unhealthy weight gain is often the result of making unhealthy food choices or not getting enough exercise. You can make changes   to your lifestyle to prevent obesity and stay as healthy as possible. What nutrition changes can be made?   Eat only as much as your body needs. To do this: ? Pay attention to signs that you are hungry or full. Stop eating as soon as you feel full. ? If you feel hungry, try drinking water first before eating. Drink enough water so your urine is clear or pale yellow. ? Eat smaller portions. Pay attention to portion sizes when eating out. ? Look at serving sizes on food labels. Most foods contain more than one serving per container. ? Eat the recommended number of calories for your gender and activity level. For most active  people, a daily total of 2,000 calories is appropriate. If you are trying to lose weight or are not very active, you may need to eat fewer calories. Talk with your health care provider or a diet and nutrition specialist (dietitian) about how many calories you need each day.  Choose healthy foods, such as: ? Fruits and vegetables. At each meal, try to fill at least half of your plate with fruits and vegetables. ? Whole grains, such as whole-wheat bread, brown rice, and quinoa. ? Lean meats, such as chicken or fish. ? Other healthy proteins, such as beans, eggs, or tofu. ? Healthy fats, such as nuts, seeds, fatty fish, and olive oil. ? Low-fat or fat-free dairy products.  Check food labels, and avoid food and drinks that: ? Are high in calories. ? Have added sugar. ? Are high in sodium. ? Have saturated fats or trans fats.  Cook foods in healthier ways, such as by baking, broiling, or grilling.  Make a meal plan for the week, and shop with a grocery list to help you stay on track with your purchases. Try to avoid going to the grocery store when you are hungry.  When grocery shopping, try to shop around the outside of the store first, where the fresh foods are. Doing this helps you to avoid prepackaged foods, which can be high in sugar, salt (sodium), and fat. What lifestyle changes can be made?   Exercise for 30 or more minutes on 5 or more days each week. Exercising may include brisk walking, yard work, biking, running, swimming, and team sports like basketball and soccer. Ask your health care provider which exercises are safe for you.  Do muscle-strengthening activities, such as lifting weights or using resistance bands, on 2 or more days a week.  Do not use any products that contain nicotine or tobacco, such as cigarettes and e-cigarettes. If you need help quitting, ask your health care provider.  Limit alcohol intake to no more than 1 drink a day for nonpregnant women and 2 drinks a  day for men. One drink equals 12 oz of beer, 5 oz of wine, or 1 oz of hard liquor.  Try to get 7-9 hours of sleep each night. What other changes can be made?  Keep a food and activity journal to keep track of: ? What you ate and how many calories you had. Remember to count the calories in sauces, dressings, and side dishes. ? Whether you were active, and what exercises you did. ? Your calorie, weight, and activity goals.  Check your weight regularly. Track any changes. If you notice you have gained weight, make changes to your diet or activity routine.  Avoid taking weight-loss medicines or supplements. Talk to your health care provider before starting any new medicine or supplement.  Talk   to your health care provider before trying any new diet or exercise plan. Why are these changes important? Eating healthy, staying active, and having healthy habits can help you to prevent obesity. Those changes also:  Help you manage stress and emotions.  Help you connect with friends and family.  Improve your self-esteem.  Improve your sleep.  Prevent long-term health problems. What can happen if changes are not made? Being obese or overweight can cause you to develop joint or bone problems, which can make it hard for you to stay active or do activities you enjoy. Being obese or overweight also puts stress on your heart and lungs and can lead to health problems like diabetes, heart disease, and some cancers. Where to find more information Talk with your health care provider or a dietitian about healthy eating and healthy lifestyle choices. You may also find information from:  U.S. Department of Agriculture, MyPlate: www.choosemyplate.gov  American Heart Association: www.heart.org  Centers for Disease Control and Prevention: www.cdc.gov Summary  Staying at a healthy weight is important to your overall health. It helps you to prevent certain diseases and health problems, such as heart  disease, diabetes, joint problems, sleep disorders, and some types of cancer.  Being obese or overweight can cause you to develop joint or bone problems, which can make it hard for you to stay active or do activities you enjoy.  You can prevent unhealthy weight gain by eating a healthy diet, exercising regularly, not smoking, limiting alcohol, and getting enough sleep.  Talk with your health care provider or a dietitian for guidance about healthy eating and healthy lifestyle choices. This information is not intended to replace advice given to you by your health care provider. Make sure you discuss any questions you have with your health care provider. Document Released: 09/13/2016 Document Revised: 06/23/2017 Document Reviewed: 10/19/2016 Elsevier Interactive Patient Education  2019 Elsevier Inc.  

## 2018-09-18 NOTE — Progress Notes (Signed)
BP 112/66   Pulse 86   Temp 97.8 F (36.6 C)   Ht 5\' 2"  (1.575 m)   Wt 172 lb 14.4 oz (78.4 kg)   SpO2 99%   BMI 31.62 kg/m    Subjective:    Patient ID: Regina Dunlap, female    DOB: 1961-06-19, 57 y.o.   MRN: [id number]  HPI: Regina Dunlap is a 57 y.o. female  Chief Complaint  Patient presents with  . Foot Pain    left foot. starts on top of foot and radiates up leg.     HPI Patient is here for an acute visit She has had left foot pain for 1-2 months She fell in January 2019 Slipped on the ice and her left leg went back underneath her; got right back up and took ibuprofen and it went away; she has not had pain since then actually The pain starts on the top of the foot and radiates up the leg Intermittent, not regular and no known exacerbating factors Just walking; driveway is hilly; good supportive shoes; New Balance and Nike and [employer] No back pain She has tried ibuprofen and using bengay Bad varicose veins on the back of the left leg Some tenderness along the bottom of the right foot in the midfoot She tries to avoid salt for her HTN She is obese; she is going to try to go to the gym and lose weight with some other women; she would like to lose 40 pounds  Depression screen [facility] 2/9 09/18/2018 07/11/2018 03/19/2018 08/03/2017 04/27/2017  Decreased Interest 0 0 0 0 0  Down, Depressed, Hopeless 0 0 0 0 0  PHQ - 2 Score 0 0 0 0 0  Altered sleeping 0 - 0 - -  Tired, decreased energy 0 - 0 - -  Change in appetite 0 - 0 - -  Feeling bad or failure about yourself  0 - 0 - -  Trouble concentrating 0 - 0 - -  Moving slowly or fidgety/restless 0 - 0 - -  Suicidal thoughts 0 - 0 - -  PHQ-9 Score 0 - 0 - -  Difficult doing work/chores Not difficult at all - Not difficult at all - -   Fall Risk  09/18/2018 07/11/2018 06/22/2018 03/19/2018 10/24/2017  Falls in the past year? 1 No No No No  Number falls in past yr: 0 - - - -  Injury with Fall? 1 - - - -     Relevant past medical, surgical, family and social history reviewed Past Medical History:  Diagnosis Date  . Esophageal reflux   . Fatty liver 11/04/2016  . Mastodynia   . Obstructive sleep apnea (adult) (pediatric)   . Other and unspecified hyperlipidemia   . Overweight (BMI 25.0-29.9) 11/04/2016  . Pain in thoracic spine   . Prediabetes 03/28/2016  . Unspecified essential hypertension   . Unspecified sleep apnea    Past Surgical History:  Procedure Laterality Date  . BREAST BIOPSY Right 1999   neg  . BREAST CYST ASPIRATION Right    neg  . TUBAL LIGATION     Family History  Problem Relation Age of Onset  . Migraines Mother   . Stroke Father   . Hypertension Father   . Hypertension Sister   . Anuerysm Brother   . Hypertension Sister   . Cancer Sister        cervical  . Cancer Sister        colon  .  Breast cancer Neg Hx    Social History   Tobacco Use  . Smoking status: Never Smoker  . Smokeless tobacco: Never Used  Substance Use Topics  . Alcohol use: Yes    Comment: social  . Drug use: No     Office Visit from 09/18/2018 in [facility]  AUDIT-C Score  1      Interim medical history since last visit reviewed. Allergies and medications reviewed  Review of Systems Per HPI unless specifically indicated above     Objective:    BP 112/66   Pulse 86   Temp 97.8 F (36.6 C)   Ht 5\' 2"  (1.575 m)   Wt 172 lb 14.4 oz (78.4 kg)   SpO2 99%   BMI 31.62 kg/m   Wt Readings from Last 3 Encounters:  09/18/18 172 lb 14.4 oz (78.4 kg)  07/11/18 174 lb (78.9 kg)  06/22/18 170 lb 8 oz (77.3 kg)    Physical Exam Constitutional:      General: She is not in acute distress.    Appearance: She is well-developed. She is obese.  Eyes:     General: No scleral icterus. Neck:     Thyroid: No thyromegaly.  Cardiovascular:     Rate and Rhythm: Normal rate.     Pulses:          Dorsalis pedis pulses are 1+ on the right side and 1+ on the left  side.  Pulmonary:     Effort: Pulmonary effort is normal.  Abdominal:     General: There is no distension.  Musculoskeletal:     Right foot: Normal range of motion. No deformity or foot drop.     Left foot: Normal range of motion. No deformity or foot drop.       Feet:     Comments: Tenderness over the 4th metatarsal LEFT foot; mild tenderness plantar fascia RIGHT foot  Feet:     Right foot:     Skin integrity: No ulcer, blister, skin breakdown, erythema or callus.     Left foot:     Skin integrity: No ulcer, blister, skin breakdown, erythema or callus.  Skin:    Coloration: Skin is not pale.  Psychiatric:        Behavior: Behavior normal.        Thought Content: Thought content normal.        Judgment: Judgment normal.       Assessment & Plan:   Problem List Items Addressed This Visit      Other   Obesity (BMI 30.0-34.9)    Encouraged weight loss       Other Visit Diagnoses    Foot pain, left    -  Primary   xray; refer to podiatrist   Relevant Orders   DG Foot Complete Left   Ambulatory referral to Podiatry   Plantar fasciitis, right       stretches, ice rolls; refer to podiatrist   Relevant Orders   Ambulatory referral to Podiatry       Follow up plan: Return if symptoms worsen or fail to improve.  An after-visit summary was printed and given to the patient at [place].  Please see the patient instructions which may contain other information and recommendations beyond what is mentioned above in the assessment and plan.  No orders of the defined types were placed in this encounter.   Orders Placed This Encounter  Procedures  . DG Foot Complete Left  .  Ambulatory referral to Podiatry

## 2018-09-20 ENCOUNTER — Other Ambulatory Visit: Payer: Self-pay | Admitting: Family Medicine

## 2018-09-20 NOTE — Telephone Encounter (Signed)
Too soon for chlorthalidone 90 day supply approved by DNP [place] on July 16, 2018 Next Rx due around Oct 14, 2018

## 2018-09-20 NOTE — Telephone Encounter (Signed)
Copied from [place] [id number]. Topic: Quick Communication - Rx Refill/Question >> Sep 20, 2018  3:24 PM [place], [place] D wrote: Medication: chlorthalidone (HYGROTON) 25 MG tablet  Has the patient contacted their pharmacy? Yes.   (Agent: If no, request that the patient contact the pharmacy for the refill.) (Agent: If yes, when and what did the pharmacy advise?)  Preferred Pharmacy (with phone number or street name): [id number] [address], [place] [phone] (Phone) [phone] (Fax)    Agent: Please be advised that RX refills may take up to 3 business days. We ask that you follow-up with your pharmacy.

## 2018-09-24 ENCOUNTER — Telehealth: Payer: Self-pay | Admitting: Family Medicine

## 2018-09-24 ENCOUNTER — Other Ambulatory Visit: Payer: Self-pay | Admitting: Nurse Practitioner

## 2018-09-24 NOTE — Telephone Encounter (Signed)
Patient still has questions regarding the medication request.

## 2018-09-24 NOTE — Telephone Encounter (Signed)
Pt calling back about RX she has been out of medicine since last week she would like 90 day supply please give pt a call at  [phone]

## 2018-09-24 NOTE — Telephone Encounter (Signed)
Called patient and called in medication

## 2018-09-24 NOTE — Telephone Encounter (Signed)
Too soon to refill chlorthalidone by at least 3 weeks Please check with patient / pharmacy to make sure she is taking this correctly

## 2018-09-24 NOTE — Telephone Encounter (Signed)
Left detailed voicemail pt should not be out until jan 21 and was told to contact pharmacy

## 2018-09-24 NOTE — Telephone Encounter (Signed)
Patient is returning call in regards to her medication. Please advise

## 2018-09-27 NOTE — Telephone Encounter (Signed)
Please see the prescription written by Suezanne Cheshire, DNP on 07/16/18 That was for a 90 day supply It is too soon for a refill Please resolve with pharmacy

## 2018-10-05 ENCOUNTER — Ambulatory Visit (INDEPENDENT_AMBULATORY_CARE_PROVIDER_SITE_OTHER): Payer: BLUE CROSS/BLUE SHIELD | Admitting: Podiatry

## 2018-10-05 ENCOUNTER — Encounter: Payer: Self-pay | Admitting: Podiatry

## 2018-10-05 VITALS — BP 119/81 | HR 96 | Temp 97.7°F

## 2018-10-05 DIAGNOSIS — M779 Enthesopathy, unspecified: Secondary | ICD-10-CM | POA: Diagnosis not present

## 2018-10-05 DIAGNOSIS — M778 Other enthesopathies, not elsewhere classified: Secondary | ICD-10-CM

## 2018-10-07 NOTE — Progress Notes (Signed)
   HPI: 58 year old female presenting today as a new patient with a chief complaint of intermittent soreness to the dorsum of the left foot that began 2-3 months ago. She states it feels like a bruise on her foot. She reports associated pain that radiates up her leg and some intermittent heel pain. She states the pain is worse at night after being on her feet all day. She has been using BenGay, stretching and taking Naproxen for treatment with some relief. Patient is here for further evaluation and treatment.   Past Medical History:  Diagnosis Date  . Esophageal reflux   . Fatty liver 11/04/2016  . Mastodynia   . Obstructive sleep apnea (adult) (pediatric)   . Other and unspecified hyperlipidemia   . Overweight (BMI 25.0-29.9) 11/04/2016  . Pain in thoracic spine   . Prediabetes 03/28/2016  . Unspecified essential hypertension   . Unspecified sleep apnea      Physical Exam: General: The patient is alert and oriented x3 in no acute distress.  Dermatology: Skin is warm, dry and supple bilateral lower extremities. Negative for open lesions or macerations.  Vascular: Palpable pedal pulses bilaterally. No edema or erythema noted. Capillary refill within normal limits.  Neurological: Epicritic and protective threshold grossly intact bilaterally.   Musculoskeletal Exam: Pain with palpation to the dorsum of the left foot. Range of motion within normal limits to all pedal and ankle joints bilateral. Muscle strength 5/5 in all groups bilateral.   Assessment: 1. Left foot capsulitis    Plan of Care:  1. Patient evaluated. X-Rays in Epic reviewed.  2. Continue taking Naproxen as needed.  3. Recommended good shoe gear.  4. Return to clinic as needed.     Edrick Kins, DPM [facility]  Dr. Edrick Kins, DPM    [address], [place] [address]                Office [phone]  Fax [phone]

## 2018-10-17 ENCOUNTER — Other Ambulatory Visit: Payer: Self-pay | Admitting: Family Medicine

## 2018-10-17 DIAGNOSIS — E876 Hypokalemia: Secondary | ICD-10-CM

## 2018-10-17 DIAGNOSIS — Z5181 Encounter for therapeutic drug level monitoring: Secondary | ICD-10-CM

## 2018-10-17 NOTE — Telephone Encounter (Signed)
Please ask the patient to come in for a lab to make sure her potassium is in the desired range I ordered the lab already Please come in as soon as convenient; no need to fast

## 2018-10-18 NOTE — Telephone Encounter (Signed)
Pt.notified

## 2018-10-19 DIAGNOSIS — G4733 Obstructive sleep apnea (adult) (pediatric): Secondary | ICD-10-CM | POA: Diagnosis not present

## 2018-10-24 ENCOUNTER — Other Ambulatory Visit: Payer: Self-pay | Admitting: Nurse Practitioner

## 2018-10-26 ENCOUNTER — Other Ambulatory Visit: Payer: Self-pay

## 2018-10-26 DIAGNOSIS — Z5181 Encounter for therapeutic drug level monitoring: Secondary | ICD-10-CM | POA: Diagnosis not present

## 2018-10-26 DIAGNOSIS — E876 Hypokalemia: Secondary | ICD-10-CM | POA: Diagnosis not present

## 2018-10-26 LAB — BASIC METABOLIC PANEL WITH GFR
BUN: 18 mg/dL (ref 7–25)
CO2: 29 mmol/L (ref 20–32)
Calcium: 9.6 mg/dL (ref 8.6–10.4)
Chloride: 103 mmol/L (ref 98–110)
Creat: 0.86 mg/dL (ref 0.50–1.05)
GFR, Est African American: 88 mL/min/{1.73_m2} (ref >=60)
GFR, Est Non African American: 76 mL/min/{1.73_m2} (ref >=60)
Glucose, Bld: 126 mg/dL (ref 65–139)
Potassium: 3.7 mmol/L (ref 3.5–5.3)
Sodium: 143 mmol/L (ref 135–146)

## 2018-10-27 ENCOUNTER — Other Ambulatory Visit: Payer: Self-pay | Admitting: Family Medicine

## 2018-10-27 MED ORDER — POTASSIUM CHLORIDE ER 10 MEQ PO TBCR: EXTENDED_RELEASE_TABLET | ORAL | 0 refills | Status: DC

## 2018-10-27 NOTE — Progress Notes (Signed)
Regina Dunlap, please let the patient know that her potassium is in the normal range. We'll continue the potassium as she's doing, and I'm sending another refill which she can get in April. Thank you

## 2018-11-09 ENCOUNTER — Other Ambulatory Visit: Payer: Self-pay | Admitting: Family Medicine

## 2018-11-09 NOTE — Telephone Encounter (Signed)
Please confirm with patient that she does not have a history of Barrett's esophagus or any recent ulcer This medicine has long-term consequences and risks Please ask her to decrease use to every other day, avoid triggers Tell her: Try to limit or avoid triggers like coffee, caffeinated beverages, onions, chocolate, spicy foods, peppermint, acidic foods like pizza, spaghetti sauce, and orange juice Lose weight if you are overweight or obese Try elevating the head of your bed by placing a small wedge between your mattress and box springs to keep acid in the stomach at night instead of coming up into your esophagus  Call with any problems and next month, we'll try to step down to H2 blocker If symptoms worsen, we'll refer to GI to consider EGD

## 2018-11-09 NOTE — Telephone Encounter (Signed)
Pt.notified

## 2018-11-10 DIAGNOSIS — R509 Fever, unspecified: Secondary | ICD-10-CM | POA: Diagnosis not present

## 2018-11-10 DIAGNOSIS — J069 Acute upper respiratory infection, unspecified: Secondary | ICD-10-CM | POA: Diagnosis not present

## 2018-12-02 ENCOUNTER — Telehealth: Payer: Self-pay | Admitting: Family Medicine

## 2018-12-02 MED ORDER — LOSARTAN POTASSIUM 100 MG PO TABS: 100.0000 mg | ORAL_TABLET | Freq: Every day | ORAL | 1 refills | Status: DC

## 2018-12-02 NOTE — Telephone Encounter (Signed)
-----   Message from Arnetha Courser, MD sent at 09/18/2018  8:42 AM EST ----- November 25, 2018 send Rx to [facility] for losartan

## 2018-12-02 NOTE — Telephone Encounter (Signed)
Lab Results  Component Value Date   CREATININE 0.86 10/26/2018   Lab Results  Component Value Date   K 3.7 10/26/2018

## 2018-12-07 ENCOUNTER — Other Ambulatory Visit: Payer: Self-pay

## 2018-12-07 ENCOUNTER — Ambulatory Visit (INDEPENDENT_AMBULATORY_CARE_PROVIDER_SITE_OTHER): Payer: BLUE CROSS/BLUE SHIELD | Admitting: Nurse Practitioner

## 2018-12-07 ENCOUNTER — Encounter: Payer: Self-pay | Admitting: Nurse Practitioner

## 2018-12-07 VITALS — BP 122/82 | HR 77 | Temp 98.0°F | Resp 12 | Ht 65.0 in | Wt 178.3 lb

## 2018-12-07 DIAGNOSIS — I83813 Varicose veins of bilateral lower extremities with pain: Secondary | ICD-10-CM

## 2018-12-07 DIAGNOSIS — E876 Hypokalemia: Secondary | ICD-10-CM | POA: Diagnosis not present

## 2018-12-07 DIAGNOSIS — M79662 Pain in left lower leg: Secondary | ICD-10-CM

## 2018-12-07 DIAGNOSIS — E663 Overweight: Secondary | ICD-10-CM

## 2018-12-07 DIAGNOSIS — I10 Essential (primary) hypertension: Secondary | ICD-10-CM

## 2018-12-07 DIAGNOSIS — M79661 Pain in right lower leg: Secondary | ICD-10-CM

## 2018-12-07 MED ORDER — GABAPENTIN 100 MG PO CAPS: 100.0000 mg | ORAL_CAPSULE | Freq: Every day | ORAL | 0 refills | Status: DC

## 2018-12-07 NOTE — Patient Instructions (Signed)
- I am not sure the exact cause of your calf pain, we will check your electrolytes to ensure they are within normal limits and refer you to vascular for your varicose veins and to look for additional causes for you calf pain.  - Additionally we will trial a short term dose of gabapentin, take this 1-2 hours before bedtime and see if it provides so additional relief. Please call or message to let me know how it works.  - Additionally work on weight loss and continue utilizing calf stretches.  - If pain is still present we can consider physical therapy or orthopedic referral   Work on some stretches below as well  Stretching and range of motion exercises These exercises warm up your muscles and joints and improve the movement and flexibility of your ankle. These exercises also help to relieve pain, numbness, and tingling. Exercise A: Gastroc and soleus stretch  1. Sit on the floor with your left / right leg extended. 2. Loop a belt or towel around ball of your left / right foot. The ball of your foot is on the walking surface, right under your toes. 3. Keep your left / right ankle and foot relaxed and keep your knee straight while you use the belt or towel to pull your foot and ankle toward you. You should feel a gentle stretch behind your calf or knee. 4. Hold this position for __________ seconds. Repeat __________ times. Complete this exercise __________ times a day. Exercise B: Dorsiflexion/plantar flexion  1. Sit with your left / right knee straight or bent. 2. Flex your left / right ankle to tilt the top of your foot toward your shin. 3. Hold this position for __________ seconds. 4. Point your toes downward to tilt the top of your foot away from your shin. 5. Hold this position for __________ seconds. Repeat __________ times with your knee straight and __________ times with your knee bent. Complete this exercise __________ times a day. Exercise C: Ankle plantar flexion, passive  1. Sit  with your left / right leg crossed over your opposite knee. 2. Use your opposite hand to pull the top of your foot and toes toward you. You should feel a gentle stretch on the top of your foot and ankle. 3. Hold this position for __________ seconds. Repeat __________ times. Complete this exercise __________ times a day. Exercise D: Ankle eversion  1. Sit with your left / right ankle crossed over your opposite knee. 2. Grip your left / right foot with your opposite hand, with your thumb on the top of your foot and with your fingers on the bottom of your foot. 3. Gently push your foot downward with a slight rotation so the smallest toes rise slightly toward the ceiling. You should feel a gentle stretch on the inside of your ankle. 4. Hold this stretch for __________ seconds. Repeat __________ times. Complete this exercise __________ times a day. Exercise E: Ankle inversion  1. Sit with your left / right ankle crossed over your opposite knee. 2. Hold your left / right foot with your opposite hand, with your thumb on the bottom of your foot and your fingers on the top of your foot. 3. Gently pull your foot. Your smallest toe should come toward you, and your thumb should be pushing against the ball of your foot. You should feel a gentle stretch on the outside of your ankle. 4. Hold the stretch for __________ seconds. Repeat __________ times. Complete this exercise __________  times a day. Exercise F: Ankle alphabet  1. Sit with your left / right leg supported at the lower leg. ? Do not rest your foot on anything. ? Make sure your foot has room to move freely. 2. Think of your left / right foot as a paintbrush, and move your foot to trace each letter of the alphabet in the air. Keep your hip and knee still while you trace. 3. Trace every letter from A to Z. Repeat __________ times. Complete this exercise __________ times a day. Strengthening exercises These exercises build strength and endurance  in your lower leg. Endurance is the ability to use your muscles for a long time, even after they get tired. Exercise G: Dorsiflexors  1. Secure a rubber exercise band or tube to an object that will not move if it is pulled on, such as a table leg. 2. Secure the other end of the band around your left / right foot. 3. Sit on the floor, facing the object with your left / right leg extended. The band or tube should be slightly tense when your foot is relaxed. 4. Slowly flex your left / right ankle and toes to bring your foot toward you. 5. Hold this position for __________ seconds. 6. Let the band or tube slowly pull your foot back to the starting position. Repeat __________ times. Complete this exercise __________ times a day. Exercise H: Plantar flexors  1. Sit on the floor with your left / right leg extended. 2. Loop a rubber exercise band or tube around the ball of your __________ foot. The ball of your foot is on the walking surface, right under your toes. The band or tube should be slightly tense when your foot is relaxed. 3. Slowly point your toes downward, pushing them away from you. 4. Hold this position for __________ seconds. 5. Let the band or tube slowly pull your foot back to the starting position. Repeat __________ times. Complete this exercise __________ times a day. Exercise I: Towel curls  1. Sit in a chair on a non-carpeted surface, and put your feet on the floor. 2. Place a towel in front of your feet. If told by your health care provider, add __________ to the end of the towel. 3. Keeping your heel on the floor, put your left / right foot on the towel. 4. Pull the towel toward you by grabbing the towel with your toes and curling them under. Keep your heel on the floor. Repeat __________ times. Complete this exercise __________ times a day. This information is not intended to replace advice given to you by your health care provider. Make sure you discuss any questions you  have with your health care provider. Document Released: 09/12/2005 Document Revised: 05/19/2016 Document Reviewed: 09/06/2015 Elsevier Interactive Patient Education  2019 [employer].

## 2018-12-07 NOTE — Progress Notes (Signed)
Name: Regina Dunlap   MRN: [id number]    DOB: 06/16/1961   Date:12/07/2018       Progress Note  Subjective  Chief Complaint  Chief Complaint  Patient presents with  . Leg Pain    onset several months.  Left outer side radiates up to thigh    HPI  Patient endorses bilateral outer lower leg pain, worse on the left fort he past 2 months- was referred to podiatry in the past because she was additionally having foot pain and was cleared but calf pain never resolved. States doesn't notice it during the day but pain is worst at night and wakes her up during sleep. States doesn't have urge to move legs in sleep but when she puts something weighted on top of her legs pain is relieve temporarily. Has been using bengay and naproxen with temporarily. Has been using compression stockings as well. She take a daily 81mg  aspirin, no recent surgeries, does not smoke, no ocp.   PHQ2/9: Depression screen [facility] 2/9 12/07/2018 09/18/2018 07/11/2018 03/19/2018 08/03/2017  Decreased Interest 0 0 0 0 0  Down, Depressed, Hopeless 0 0 0 0 0  PHQ - 2 Score 0 0 0 0 0  Altered sleeping 0 0 - 0 -  Tired, decreased energy 0 0 - 0 -  Change in appetite 0 0 - 0 -  Feeling bad or failure about yourself  0 0 - 0 -  Trouble concentrating 0 0 - 0 -  Moving slowly or fidgety/restless 0 0 - 0 -  Suicidal thoughts 0 0 - 0 -  PHQ-9 Score 0 0 - 0 -  Difficult doing work/chores Not difficult at all Not difficult at all - Not difficult at all -    PHQ reviewed. Negative  Patient Active Problem List   Diagnosis Date Noted  . Hypokalemia 11/23/2017  . Postmenopausal bleeding 10/17/2017  . Varicose veins of both lower extremities without ulcer or inflammation 04/27/2017  . Sleep apnea 04/24/2017  . Preventative health care 04/24/2017  . Obesity (BMI 30.0-34.9) 11/04/2016  . Fatty liver 11/04/2016  . History of concussion 06/07/2016  . MCI (mild cognitive impairment) 06/07/2016  . Medication monitoring encounter  03/28/2016  . Prediabetes 03/28/2016  . Memory impairment 03/28/2016  . Decreased sense of smell 03/28/2016  . Hypercholesterolemia with hypertriglyceridemia 10/13/2015  . Encounter for screening mammogram for malignant neoplasm of breast 10/13/2015  . GERD without esophagitis 10/13/2015  . Screening for STD (sexually transmitted disease) 10/13/2015  . Allergic rhinitis 09/16/2015  . Lipoma of arm 09/16/2015  . Family history of colon cancer 09/16/2015  . Hypertension goal BP (blood pressure) < 140/90     Past Medical History:  Diagnosis Date  . Esophageal reflux   . Fatty liver 11/04/2016  . Mastodynia   . Obstructive sleep apnea (adult) (pediatric)   . Other and unspecified hyperlipidemia   . Overweight (BMI 25.0-29.9) 11/04/2016  . Pain in thoracic spine   . Prediabetes 03/28/2016  . Unspecified essential hypertension   . Unspecified sleep apnea     Past Surgical History:  Procedure Laterality Date  . BREAST BIOPSY Right 1999   neg  . BREAST CYST ASPIRATION Right    neg  . TUBAL LIGATION      Social History   Tobacco Use  . Smoking status: Never Smoker  . Smokeless tobacco: Never Used  Substance Use Topics  . Alcohol use: Yes    Comment: social     Current Outpatient  Medications:  .  aspirin 81 MG tablet, Take 81 mg by mouth daily., Disp: , Rfl:  .  chlorthalidone (HYGROTON) 25 MG tablet, TAKE 1 TABLET BY MOUTH EVERY DAY, Disp: 90 tablet, Rfl: 0 .  fluticasone (FLONASE) 50 MCG/ACT nasal spray, Place 2 sprays into both nostrils daily. (Patient taking differently: Place 2 sprays into both nostrils as needed. ), Disp: 16 g, Rfl: 6 .  Krill Oil 1000 MG CAPS, Take 1 capsule daily by mouth. , Disp: , Rfl:  .  loratadine (CLARITIN) 10 MG tablet, Take 1 tablet (10 mg total) by mouth daily as needed for allergies., Disp: 30 tablet, Rfl: 11 .  losartan (COZAAR) 100 MG tablet, Take 1 tablet (100 mg total) by mouth daily., Disp: 90 tablet, Rfl: 1 .  Magnesium 100 MG CAPS, Take  1,000 mg by mouth., Disp: , Rfl:  .  Multiple Minerals-Vitamins (CALCIUM-MAGNESIUM-ZINC-D3) TABS, Take 2 tablets by mouth daily., Disp: , Rfl:  .  omeprazole (PRILOSEC) 20 MG capsule, Take 1 capsule (20 mg total) by mouth every other day., Disp: 15 capsule, Rfl: 0 .  potassium chloride (K-DUR) 10 MEQ tablet, One by mouth three times a week, Disp: 39 tablet, Rfl: 0 .  simvastatin (ZOCOR) 40 MG tablet, TAKE 1 TABLET BY MOUTH EVERY NIGHT AT BEDTIME, Disp: 90 tablet, Rfl: 0 .  TURMERIC PO, Take by mouth daily. Reported on 03/28/2016, Disp: , Rfl:  .  Aspirin-Calcium Carbonate (BAYER WOMENS) [phone] MG TABS, Take by mouth., Disp: , Rfl:  .  gabapentin (NEURONTIN) 100 MG capsule, Take 1 capsule (100 mg total) by mouth at bedtime., Disp: 14 capsule, Rfl: 0  Allergies  Allergen Reactions  . Ciprofloxacin   . Lisinopril   . Migraine Formula [Aspirin-Acetaminophen-Caffeine]     Midrina; broke out in rash   . Povidone Iodine Rash    ROS  No other specific complaints in a complete review of systems (except as listed in HPI above).  Objective  Vitals:   12/07/18 1341  BP: 122/82  Pulse: 77  Resp: 12  Temp: 98 F (36.7 C)  TempSrc: Oral  SpO2: 97%  Weight: 178 lb 4.8 oz (80.9 kg)  Height: 5\' 5"  (1.651 m)     Body mass index is 29.67 kg/m.  Nursing Note and Vital Signs reviewed.  Physical Exam Vitals signs reviewed.  Constitutional:      Appearance: She is well-developed.  HENT:     Head: Normocephalic and atraumatic.  Neck:     Musculoskeletal: Normal range of motion and neck supple.     Vascular: No carotid bruit.  Cardiovascular:     Heart sounds: Normal heart sounds.  Pulmonary:     Effort: Pulmonary effort is normal.     Breath sounds: Normal breath sounds.  Abdominal:     General: Bowel sounds are normal.     Palpations: Abdomen is soft.     Tenderness: There is no abdominal tenderness.  Musculoskeletal: Normal range of motion.        General: No swelling or  tenderness.     Right knee: Normal.     Left knee: Normal.     Right ankle: Normal. She exhibits normal pulse.     Left ankle: Normal. She exhibits normal pulse.     Right lower leg: No edema.     Left lower leg: No edema.       Legs:  Skin:    General: Skin is warm and dry.  Capillary Refill: Capillary refill takes less than 2 seconds.  Neurological:     Mental Status: She is alert and oriented to person, place, and time.     GCS: GCS eye subscore is 4. GCS verbal subscore is 5. GCS motor subscore is 6.     Sensory: No sensory deficit.  Psychiatric:        Speech: Speech normal.        Behavior: Behavior normal.        Thought Content: Thought content normal.        Judgment: Judgment normal.      No results found for this or any previous visit (from the past 48 hour(s)).  Assessment & Plan  1. Bilateral calf pain Bilateral calf pain worse at night ongoing for greater than 2 months, improved with naproxen and stretching at night. Strong pedal pulses. Pain is not overtly neuropathic, muscular or vascular- check electrolytes to ensure not causing leg cramping, trial gabapentin to see if improvement due to RLS or neuropathic pain, discussed weight loss and stretching, continue to use compression stalking, refer to vascular per patient preference for varicose veins and further investigation.  - Ambulatory referral to Vascular Surgery - Basic Metabolic Panel (BMET) - Magnesium - gabapentin (NEURONTIN) 100 MG capsule; Take 1 capsule (100 mg total) by mouth at bedtime.  Dispense: 14 capsule; Refill: 0  2. Varicose veins of both lower extremities with pain - Ambulatory referral to Vascular Surgery  3. Hypokalemia Continue supplementation  - Basic Metabolic Panel (BMET) - Magnesium  4. Overweight (BMI 25.0-29.9) Discussed weight loss   5. Hypertension goal BP (blood pressure) < 140/90 Stable continue current regimen   Face-to-face time with patient was more than 25  minutes, >50% time spent counseling and coordination of care

## 2018-12-08 LAB — BASIC METABOLIC PANEL
BUN/Creatinine Ratio: 18 (calc) (ref 6–22)
BUN: 20 mg/dL (ref 7–25)
CHLORIDE: 102 mmol/L (ref 98–110)
CO2: 28 mmol/L (ref 20–32)
CREATININE: 1.12 mg/dL — AB (ref 0.50–1.05)
Calcium: 9.5 mg/dL (ref 8.6–10.4)
Glucose, Bld: 96 mg/dL (ref 65–99)
Potassium: 3.5 mmol/L (ref 3.5–5.3)
Sodium: 139 mmol/L (ref 135–146)

## 2018-12-08 LAB — MAGNESIUM: Magnesium: 2.2 mg/dL (ref 1.5–2.5)

## 2018-12-16 ENCOUNTER — Other Ambulatory Visit: Payer: Self-pay | Admitting: Family Medicine

## 2018-12-16 MED ORDER — FAMOTIDINE 20 MG PO TABS: 20.0000 mg | ORAL_TABLET | Freq: Two times a day (BID) | ORAL | 1 refills | Status: DC | PRN

## 2018-12-16 NOTE — Telephone Encounter (Signed)
Omeprazole requested See last Rx: Patient Sig: Take 1 capsule (20 mg total) by mouth every other day.      Ordered on: 11/09/2018     Authorized by: Zamora Colton P     Dispense: 15 capsule     Note to Pharmacy: Weaning, plan to switch to H2 blocker next month if tolerated; 30 day supply only      Will switch now to H2 blocker

## 2018-12-18 DIAGNOSIS — G4733 Obstructive sleep apnea (adult) (pediatric): Secondary | ICD-10-CM | POA: Diagnosis not present

## 2018-12-21 ENCOUNTER — Other Ambulatory Visit: Payer: Self-pay | Admitting: Nurse Practitioner

## 2018-12-22 ENCOUNTER — Other Ambulatory Visit: Payer: Self-pay | Admitting: Nurse Practitioner

## 2018-12-22 DIAGNOSIS — M79662 Pain in left lower leg: Principal | ICD-10-CM

## 2018-12-22 DIAGNOSIS — M79661 Pain in right lower leg: Secondary | ICD-10-CM

## 2019-01-06 ENCOUNTER — Other Ambulatory Visit: Payer: Self-pay | Admitting: Nurse Practitioner

## 2019-01-06 DIAGNOSIS — M79662 Pain in left lower leg: Principal | ICD-10-CM

## 2019-01-06 DIAGNOSIS — M79661 Pain in right lower leg: Secondary | ICD-10-CM

## 2019-01-17 ENCOUNTER — Other Ambulatory Visit: Payer: Self-pay | Admitting: Nurse Practitioner

## 2019-01-17 ENCOUNTER — Other Ambulatory Visit: Payer: Self-pay | Admitting: Family Medicine

## 2019-01-17 NOTE — Telephone Encounter (Signed)
Lab Results  Component Value Date   CREATININE 1.12 (H) 12/07/2018   Lab Results  Component Value Date   K 3.5 12/07/2018

## 2019-01-18 ENCOUNTER — Other Ambulatory Visit: Payer: Self-pay | Admitting: Nurse Practitioner

## 2019-01-18 DIAGNOSIS — M79661 Pain in right lower leg: Secondary | ICD-10-CM

## 2019-01-18 DIAGNOSIS — G4733 Obstructive sleep apnea (adult) (pediatric): Secondary | ICD-10-CM | POA: Diagnosis not present

## 2019-01-18 DIAGNOSIS — M79662 Pain in left lower leg: Principal | ICD-10-CM

## 2019-01-18 NOTE — Telephone Encounter (Signed)
lvm @ 10:59 asking pt to return call to schedule regular appt. Informed pt that prescription has been sent to the pharmacy.

## 2019-01-18 NOTE — Telephone Encounter (Signed)
Please ask patient if she can schedule a routine appointment, has not had her 6 month routine appointment- it appears the last few appointments have been for acute issues.

## 2019-02-04 ENCOUNTER — Telehealth: Payer: Self-pay

## 2019-02-04 NOTE — Telephone Encounter (Signed)
Copied from [place] [id number]. Topic: General - Other >> Feb 04, 2019  9:26 AM Burchel, Abbi R wrote: Reason for CRM:   Pt states she received a call from the nurse, please call pt.

## 2019-02-06 ENCOUNTER — Encounter: Payer: Self-pay | Admitting: Nurse Practitioner

## 2019-02-06 ENCOUNTER — Other Ambulatory Visit: Payer: Self-pay

## 2019-02-06 ENCOUNTER — Ambulatory Visit (INDEPENDENT_AMBULATORY_CARE_PROVIDER_SITE_OTHER): Payer: BLUE CROSS/BLUE SHIELD | Admitting: Nurse Practitioner

## 2019-02-06 DIAGNOSIS — Z5181 Encounter for therapeutic drug level monitoring: Secondary | ICD-10-CM

## 2019-02-06 DIAGNOSIS — M79602 Pain in left arm: Secondary | ICD-10-CM

## 2019-02-06 DIAGNOSIS — I10 Essential (primary) hypertension: Secondary | ICD-10-CM

## 2019-02-06 DIAGNOSIS — J302 Other seasonal allergic rhinitis: Secondary | ICD-10-CM | POA: Diagnosis not present

## 2019-02-06 DIAGNOSIS — M79601 Pain in right arm: Secondary | ICD-10-CM

## 2019-02-06 DIAGNOSIS — G4730 Sleep apnea, unspecified: Secondary | ICD-10-CM | POA: Diagnosis not present

## 2019-02-06 DIAGNOSIS — K219 Gastro-esophageal reflux disease without esophagitis: Secondary | ICD-10-CM

## 2019-02-06 DIAGNOSIS — R0981 Nasal congestion: Secondary | ICD-10-CM

## 2019-02-06 DIAGNOSIS — R7303 Prediabetes: Secondary | ICD-10-CM

## 2019-02-06 DIAGNOSIS — E782 Mixed hyperlipidemia: Secondary | ICD-10-CM

## 2019-02-06 MED ORDER — LORATADINE 10 MG PO TABS: 10.0000 mg | ORAL_TABLET | Freq: Every day | ORAL | 11 refills | Status: DC | PRN

## 2019-02-06 MED ORDER — FLUTICASONE PROPIONATE 50 MCG/ACT NA SUSP: 2.0000 | Freq: Every day | NASAL | 6 refills | Status: DC

## 2019-02-06 MED ORDER — OMEGA-3-ACID ETHYL ESTERS 1 G PO CAPS: 1.0000 g | ORAL_CAPSULE | Freq: Two times a day (BID) | ORAL | 1 refills | Status: DC

## 2019-02-06 NOTE — Progress Notes (Signed)
Virtual Visit via Telephone Note  I connected with Regina Dunlap on 02/06/19 at  9:40 AM EDT by a telephone and verified that I am speaking with the correct person using two identifiers.   Staff discussed the limitations of evaluation and management by telemedicine and the availability of in person appointments. The patient expressed understanding and agreed to proceed.  Patient location: home  My location: home office Other people present:  none HPI  Hypertension  rx losartan 100mg , chlorthalidone 25mg  daily additionally take potassium 10 MEQ daily. Takes it as prescribed.  Denies headache, dizziness, blurred vision, lightheadedness BP Readings from Last 3 Encounters:  12/07/18 122/82  10/05/18 119/81  09/18/18 112/66   Hyperlipidemia Takes simvastatin 40mg  nightly, no missed doses; was on lovaza for a time but became to expensive but has new insurance and thinks it should be covered.  Lab Results  Component Value Date   CHOL 175 06/22/2018   HDL 57 06/22/2018   LDLCALC 93 06/22/2018   TRIG 153 (H) 06/22/2018   CHOLHDL 3.1 06/22/2018    Prediabetes Has been increasing activity and cut down on rice and red meats. Lab Results  Component Value Date   HGBA1C 6.0 (H) 06/22/2018    Sleep apnea CPAP compliance 100% states she feels rested in the morning  Seasonal allergies Uses Flonase and Claritin PRN and it works well for her    Bilateral Hand pain Patient endorses bilateral arm pain starts in fingers and goes down hands at night. States it happens maybe once a night. Endorses pins and needles and crampy pain. States self resolves in 5 minutes when she is laying down. States last time this happened her potassium levels were low but she taking meds as prescribed.  PHQ2/9: Depression screen [facility] 2/9 02/06/2019 12/07/2018 09/18/2018 07/11/2018 03/19/2018  Decreased Interest 0 0 0 0 0  Down, Depressed, Hopeless 0 0 0 0 0  PHQ - 2 Score 0 0 0 0 0  Altered sleeping 0 0 0 - 0   Tired, decreased energy 0 0 0 - 0  Change in appetite 0 0 0 - 0  Feeling bad or failure about yourself  0 0 0 - 0  Trouble concentrating 0 0 0 - 0  Moving slowly or fidgety/restless 0 0 0 - 0  Suicidal thoughts 0 0 0 - 0  PHQ-9 Score 0 0 0 - 0  Difficult doing work/chores Not difficult at all Not difficult at all Not difficult at all - Not difficult at all    PHQ reviewed. Negative  Patient Active Problem List   Diagnosis Date Noted  . Hypokalemia 11/23/2017  . Postmenopausal bleeding 10/17/2017  . Varicose veins of both lower extremities without ulcer or inflammation 04/27/2017  . Sleep apnea 04/24/2017  . Preventative health care 04/24/2017  . Obesity (BMI 30.0-34.9) 11/04/2016  . Fatty liver 11/04/2016  . History of concussion 06/07/2016  . MCI (mild cognitive impairment) 06/07/2016  . Medication monitoring encounter 03/28/2016  . Prediabetes 03/28/2016  . Memory impairment 03/28/2016  . Decreased sense of smell 03/28/2016  . Hypercholesterolemia with hypertriglyceridemia 10/13/2015  . Encounter for screening mammogram for malignant neoplasm of breast 10/13/2015  . GERD without esophagitis 10/13/2015  . Screening for STD (sexually transmitted disease) 10/13/2015  . Allergic rhinitis 09/16/2015  . Lipoma of arm 09/16/2015  . Family history of colon cancer 09/16/2015  . Hypertension goal BP (blood pressure) < 140/90     Past Medical History:  Diagnosis Date  . Esophageal  reflux   . Fatty liver 11/04/2016  . Mastodynia   . Obstructive sleep apnea (adult) (pediatric)   . Other and unspecified hyperlipidemia   . Overweight (BMI 25.0-29.9) 11/04/2016  . Pain in thoracic spine   . Prediabetes 03/28/2016  . Unspecified essential hypertension   . Unspecified sleep apnea     Past Surgical History:  Procedure Laterality Date  . BREAST BIOPSY Right 1999   neg  . BREAST CYST ASPIRATION Right    neg  . TUBAL LIGATION      Social History   Tobacco Use  . Smoking status:  Never Smoker  . Smokeless tobacco: Never Used  Substance Use Topics  . Alcohol use: Yes    Comment: social     Current Outpatient Medications:  .  aspirin 81 MG tablet, Take 81 mg by mouth daily., Disp: , Rfl:  .  chlorthalidone (HYGROTON) 25 MG tablet, TAKE 1 TABLET BY MOUTH EVERY DAY, Disp: 90 tablet, Rfl: 0 .  fluticasone (FLONASE) 50 MCG/ACT nasal spray, Place 2 sprays into both nostrils daily. (Patient taking differently: Place 2 sprays into both nostrils as needed. ), Disp: 16 g, Rfl: 6 .  Krill Oil 1000 MG CAPS, Take 1 capsule daily by mouth. , Disp: , Rfl:  .  losartan (COZAAR) 100 MG tablet, Take 1 tablet (100 mg total) by mouth daily., Disp: 90 tablet, Rfl: 1 .  Magnesium 100 MG CAPS, Take 1,000 mg by mouth., Disp: , Rfl:  .  Multiple Minerals-Vitamins (CALCIUM-MAGNESIUM-ZINC-D3) TABS, Take 2 tablets by mouth daily., Disp: , Rfl:  .  potassium chloride (K-DUR) 10 MEQ tablet, TAKE 1 TABLET BY MOUTH 3 TIMES A WEEK, Disp: 39 tablet, Rfl: 0 .  simvastatin (ZOCOR) 40 MG tablet, TAKE 1 TABLET BY MOUTH EVERY NIGHT AT BEDTIME, Disp: 90 tablet, Rfl: 0 .  TURMERIC PO, Take by mouth daily. Reported on 03/28/2016, Disp: , Rfl:  .  Aspirin-Calcium Carbonate (BAYER WOMENS) [phone] MG TABS, Take by mouth., Disp: , Rfl:  .  famotidine (PEPCID) 20 MG tablet, Take 1 tablet (20 mg total) by mouth 2 (two) times daily as needed for heartburn or indigestion. Call MD if worsening symptoms (Patient not taking: Reported on 02/06/2019), Disp: 180 tablet, Rfl: 1 .  gabapentin (NEURONTIN) 100 MG capsule, One tablet at bedtime as needed. Please schedule virtual appointment for further refills (Patient not taking: Reported on 02/06/2019), Disp: 14 capsule, Rfl: 0 .  loratadine (CLARITIN) 10 MG tablet, Take 1 tablet (10 mg total) by mouth daily as needed for allergies. (Patient not taking: Reported on 02/06/2019), Disp: 30 tablet, Rfl: 11  Allergies  Allergen Reactions  . Ciprofloxacin   . Lisinopril   . Migraine  Formula [Aspirin-Acetaminophen-Caffeine]     Midrina; broke out in rash   . Povidone Iodine Rash    ROS   No other specific complaints in a complete review of systems (except as listed in HPI above).  Objective  There were no vitals filed for this visit.   There is no height or weight on file to calculate BMI.  Nursing Note and Vital Signs reviewed.  Physical Exam  Alert and oriented  Assessment & Plan  1. Hypertension goal BP (blood pressure) < 140/90 Asymptomatic, continue meds  2. Nasal congestion - fluticasone (FLONASE) 50 MCG/ACT nasal spray; Place 2 sprays into both nostrils daily.  Dispense: 16 g; Refill: 6  3. Seasonal allergies - loratadine (CLARITIN) 10 MG tablet; Take 1 tablet (10 mg total) by mouth  daily as needed for allergies.  Dispense: 30 tablet; Refill: 11  4. Sleep apnea, unspecified type Compliant   5. GERD without esophagitis Diet controlled  6. Prediabetes Discussed diet - HgB A1c  7. Hypercholesterolemia with hypertriglyceridemia - omega-3 acid ethyl esters (LOVAZA) 1 g capsule; Take 1 capsule (1 g total) by mouth 2 (two) times daily.  Dispense: 180 capsule; Refill: 1 - Lipid Profile  8. Medication monitoring encounter Monitor potassium and LFTs - Comprehensive Metabolic Panel (CMET)  9. Pain in both upper extremities Likely caused by compression during sleep, recommend changing sleep habits, will check potassium levels. Discussed red flag symptoms, if persistent or not improving will follow-up Follow Up Instructions:   6 months routine  I discussed the assessment and treatment plan with the patient. The patient was provided an opportunity to ask questions and all were answered. The patient agreed with the plan and demonstrated an understanding of the instructions.   The patient was advised to call back or seek an in-person evaluation if the symptoms worsen or if the condition fails to improve as anticipated.  I provided 22 minutes of  non-face-to-face time during this encounter.   Fredderick Severance, NP

## 2019-02-17 DIAGNOSIS — G4733 Obstructive sleep apnea (adult) (pediatric): Secondary | ICD-10-CM | POA: Diagnosis not present

## 2019-02-21 DIAGNOSIS — E782 Mixed hyperlipidemia: Secondary | ICD-10-CM | POA: Diagnosis not present

## 2019-02-21 DIAGNOSIS — Z5181 Encounter for therapeutic drug level monitoring: Secondary | ICD-10-CM | POA: Diagnosis not present

## 2019-02-21 DIAGNOSIS — R7303 Prediabetes: Secondary | ICD-10-CM | POA: Diagnosis not present

## 2019-02-22 ENCOUNTER — Telehealth: Payer: Self-pay

## 2019-02-22 LAB — LIPID PANEL
Cholesterol: 150 mg/dL (ref <200)
HDL: 49 mg/dL — ABNORMAL LOW (ref >=50)
LDL Cholesterol (Calc): 76 mg/dL (calc)
Non-HDL Cholesterol (Calc): 101 mg/dL (calc) (ref <130)
Total CHOL/HDL Ratio: 3.1 (calc) (ref <5.0)
Triglycerides: 151 mg/dL — ABNORMAL HIGH (ref <150)

## 2019-02-22 LAB — COMPREHENSIVE METABOLIC PANEL
AG Ratio: 1.6 (calc) (ref 1.0–2.5)
ALT: 25 U/L (ref 6–29)
AST: 21 U/L (ref 10–35)
Albumin: 4.3 g/dL (ref 3.6–5.1)
Alkaline phosphatase (APISO): 87 U/L (ref 37–153)
BUN: 20 mg/dL (ref 7–25)
CO2: 27 mmol/L (ref 20–32)
Calcium: 9.4 mg/dL (ref 8.6–10.4)
Chloride: 105 mmol/L (ref 98–110)
Creat: 0.92 mg/dL (ref 0.50–1.05)
Globulin: 2.7 g/dL (calc) (ref 1.9–3.7)
Glucose, Bld: 113 mg/dL — ABNORMAL HIGH (ref 65–99)
Potassium: 3.3 mmol/L — ABNORMAL LOW (ref 3.5–5.3)
Sodium: 142 mmol/L (ref 135–146)
Total Bilirubin: 0.5 mg/dL (ref 0.2–1.2)
Total Protein: 7 g/dL (ref 6.1–8.1)

## 2019-02-22 LAB — HEMOGLOBIN A1C
Hgb A1c MFr Bld: 6 % of total Hgb — ABNORMAL HIGH (ref <5.7)
Mean Plasma Glucose: 126 (calc)
eAG (mmol/L): 7 (calc)

## 2019-02-22 NOTE — Telephone Encounter (Signed)
-----   Message from Fredderick Severance, NP sent at 02/22/2019  8:32 AM EDT ----- Overall cholesterol panel is okay- LDL (bad) has decreased, and HDL (good) is mildly under goal. Received paperwork from [facility] that rx omega-3 was not covered. Can take OTC fish oils and utilize healthy diet.  A1C is stable at 6.0 % still at prediabetic level. Sugar mildly elevated and potassium a little bit lower than goal. All other electrolytes, kidney and liver function are normal.  Some foods high in potassium are: avocados, bananas, coconut, sweet and regular potatoes, spinach, milk and lentils- please add some of them to your diet and work on avoiding carbohydrate heavy meals and increasing lean proteins.

## 2019-03-20 DIAGNOSIS — G4733 Obstructive sleep apnea (adult) (pediatric): Secondary | ICD-10-CM | POA: Diagnosis not present

## 2019-03-23 ENCOUNTER — Other Ambulatory Visit: Payer: Self-pay | Admitting: Nurse Practitioner

## 2019-04-02 ENCOUNTER — Telehealth: Payer: Self-pay | Admitting: Family Medicine

## 2019-04-02 DIAGNOSIS — M79661 Pain in right lower leg: Secondary | ICD-10-CM

## 2019-04-02 DIAGNOSIS — M79662 Pain in left lower leg: Secondary | ICD-10-CM

## 2019-04-02 DIAGNOSIS — I83813 Varicose veins of bilateral lower extremities with pain: Secondary | ICD-10-CM

## 2019-04-02 NOTE — Telephone Encounter (Signed)
Pt states they keep playing phone tag can you just setup with date and time and leave on her voicemail? Or ask them to?

## 2019-04-02 NOTE — Telephone Encounter (Addendum)
According to the referral, they have tried to reach this patient several times and was unsuccessful. The referral has been closed but can be reopened if they give permission for her to be seen at their office since she never returned their calls.  Please inform patient.

## 2019-04-02 NOTE — Telephone Encounter (Signed)
Pt said she never heard about her referral [facility], please give her status on this

## 2019-04-02 NOTE — Telephone Encounter (Signed)
l °

## 2019-04-03 NOTE — Telephone Encounter (Signed)
Please review telephone message and let us know if this patient can still be seen at your office.   Thanks

## 2019-04-03 NOTE — Telephone Encounter (Signed)
This referral has been closed due to not being able to contact the patient and time lapse. Please resend new referral and the contact will be reattempted. thanks

## 2019-04-05 NOTE — Telephone Encounter (Signed)
New referral has been placed.  °

## 2019-04-16 ENCOUNTER — Other Ambulatory Visit: Payer: Self-pay | Admitting: Family Medicine

## 2019-04-19 DIAGNOSIS — G4733 Obstructive sleep apnea (adult) (pediatric): Secondary | ICD-10-CM | POA: Diagnosis not present

## 2019-04-21 ENCOUNTER — Other Ambulatory Visit: Payer: Self-pay | Admitting: Nurse Practitioner

## 2019-04-23 ENCOUNTER — Other Ambulatory Visit: Payer: Self-pay

## 2019-04-23 ENCOUNTER — Encounter (INDEPENDENT_AMBULATORY_CARE_PROVIDER_SITE_OTHER): Payer: Self-pay

## 2019-04-23 ENCOUNTER — Encounter (INDEPENDENT_AMBULATORY_CARE_PROVIDER_SITE_OTHER): Payer: Self-pay | Admitting: Vascular Surgery

## 2019-04-23 ENCOUNTER — Ambulatory Visit (INDEPENDENT_AMBULATORY_CARE_PROVIDER_SITE_OTHER): Payer: BC Managed Care – PPO | Admitting: Vascular Surgery

## 2019-04-23 VITALS — BP 121/87 | HR 80 | Resp 12 | Ht 62.0 in | Wt 178.0 lb

## 2019-04-23 DIAGNOSIS — Z79899 Other long term (current) drug therapy: Secondary | ICD-10-CM

## 2019-04-23 DIAGNOSIS — I83812 Varicose veins of left lower extremities with pain: Secondary | ICD-10-CM | POA: Diagnosis not present

## 2019-04-23 DIAGNOSIS — E782 Mixed hyperlipidemia: Secondary | ICD-10-CM

## 2019-04-23 DIAGNOSIS — I10 Essential (primary) hypertension: Secondary | ICD-10-CM

## 2019-04-23 NOTE — Assessment & Plan Note (Signed)
lipid control important in reducing the progression of atherosclerotic disease. Continue statin therapy  

## 2019-04-23 NOTE — Patient Instructions (Signed)

## 2019-04-23 NOTE — Progress Notes (Signed)
Patient ID: Regina Dunlap, female   DOB: 1960-12-01, 58 y.o.   MRN: [id number]  Chief Complaint  Patient presents with  . New Patient (Initial Visit)    HPI Regina Dunlap is a 58 y.o. female.  I am asked to see the patient by Edgardo Roys, NP for evaluation of varicose veins.  The patient presents with complaints of symptomatic varicosities of the left leg. The patient reports a long standing history of varicosities and they have become painful over time. There was no clear inciting event or causative factor that started the symptoms.  The left leg is more severly affected. The patient elevates the legs for relief. The pain is described as cramping and burning in the calf and lower leg particularly in the evening.  The pain wakes her at night.  She has been given some tramadol with limited improvement. The symptoms are generally most severe in the evening, particularly when they have been on their feet for long periods of time.  Elevation has been used to try to improve the symptoms with limited success. The patient complains of intermittent swelling as an associated symptom. The patient has no previous history of deep venous thrombosis or superficial thrombophlebitis to their knowledge.     Past Medical History:  Diagnosis Date  . Esophageal reflux   . Fatty liver 11/04/2016  . Mastodynia   . Obstructive sleep apnea (adult) (pediatric)   . Other and unspecified hyperlipidemia   . Overweight (BMI 25.0-29.9) 11/04/2016  . Pain in thoracic spine   . Prediabetes 03/28/2016  . Unspecified essential hypertension   . Unspecified sleep apnea     Past Surgical History:  Procedure Laterality Date  . BREAST BIOPSY Right 1999   neg  . BREAST CYST ASPIRATION Right    neg  . TUBAL LIGATION      Family History  Problem Relation Age of Onset  . Migraines Mother   . Stroke Father   . Hypertension Father   . Hypertension Sister   . Anuerysm Brother   . Hypertension Sister   .  Cancer Sister        cervical  . Cancer Sister        colon  . Breast cancer Neg Hx      Social History Social History   Tobacco Use  . Smoking status: Never Smoker  . Smokeless tobacco: Never Used  Substance Use Topics  . Alcohol use: Yes    Comment: social  . Drug use: No    Allergies  Allergen Reactions  . Ciprofloxacin   . Lisinopril   . Migraine Formula [Aspirin-Acetaminophen-Caffeine]     Midrina; broke out in rash   . Povidone Iodine Rash    Current Outpatient Medications  Medication Sig Dispense Refill  . Aspirin-Calcium Carbonate (BAYER WOMENS) [phone] MG TABS Take by mouth.    . chlorthalidone (HYGROTON) 25 MG tablet TAKE 1 TABLET BY MOUTH EVERY DAY 90 tablet 0  . fluticasone (FLONASE) 50 MCG/ACT nasal spray Place 2 sprays into both nostrils daily. 16 g 6  . gabapentin (NEURONTIN) 100 MG capsule Take 100 mg by mouth daily.    Javier Docker Oil 1000 MG CAPS Take 1 capsule daily by mouth.     . loratadine (CLARITIN) 10 MG tablet Take 1 tablet (10 mg total) by mouth daily as needed for allergies. 30 tablet 11  . losartan (COZAAR) 100 MG tablet Take 1 tablet (100 mg total) by mouth daily. 90 tablet  1  . Magnesium 100 MG CAPS Take 1,000 mg by mouth.    . Multiple Minerals-Vitamins (CALCIUM-MAGNESIUM-ZINC-D3) TABS Take 2 tablets by mouth daily.    . potassium chloride (K-DUR) 10 MEQ tablet TAKE 1 TABLET BY MOUTH 3 TIMES A WEEK 39 tablet 0  . simvastatin (ZOCOR) 40 MG tablet TAKE 1 TABLET BY MOUTH EVERY NIGHT AT BEDTIME 90 tablet 1  . TURMERIC PO Take by mouth daily. Reported on 03/28/2016    . aspirin 81 MG tablet Take 81 mg by mouth daily.    Marland Kitchen omega-3 acid ethyl esters (LOVAZA) 1 g capsule Take 1 capsule (1 g total) by mouth 2 (two) times daily. (Patient not taking: Reported on 04/23/2019) 180 capsule 1   No current facility-administered medications for this visit.       REVIEW OF SYSTEMS (Negative unless checked)  Constitutional: [] Weight loss  [] Fever  [] Chills  Cardiac: [] Chest pain   [] Chest pressure   [] Palpitations   [] Shortness of breath when laying flat   [] Shortness of breath at rest   [] Shortness of breath with exertion. Vascular:  [] Pain in legs with walking   [] Pain in legs at rest   [] Pain in legs when laying flat   [] Claudication   [] Pain in feet when walking  [] Pain in feet at rest  [] Pain in feet when laying flat   [] History of DVT   [] Phlebitis   [x] Swelling in legs   [x] Varicose veins   [] Non-healing ulcers Pulmonary:   [] Uses home oxygen   [] Productive cough   [] Hemoptysis   [] Wheeze  [] COPD   [] Asthma Neurologic:  [] Dizziness  [] Blackouts   [] Seizures   [] History of stroke   [] History of TIA  [] Aphasia   [] Temporary blindness   [] Dysphagia   [] Weakness or numbness in arms   [] Weakness or numbness in legs Musculoskeletal:  [] Arthritis   [] Joint swelling   [x] Joint pain   [x] Low back pain Hematologic:  [] Easy bruising  [] Easy bleeding   [] Hypercoagulable state   [] Anemic  [] Hepatitis Gastrointestinal:  [] Blood in stool   [] Vomiting blood  [x] Gastroesophageal reflux/heartburn   [] Abdominal pain Genitourinary:  [] Chronic kidney disease   [] Difficult urination  [] Frequent urination  [] Burning with urination   [] Hematuria Skin:  [] Rashes   [] Ulcers   [] Wounds Psychological:  [] History of anxiety   []  History of major depression.    Physical Exam BP 121/87 (BP Location: Left Arm, Patient Position: Sitting, Cuff Size: Normal)   Pulse 80   Resp 12   Ht 5\' 2"  (1.575 m)   Wt 178 lb (80.7 kg)   BMI 32.56 kg/m  Gen:  WD/WN, NAD Head: [place]/AT, No temporalis wasting.  Ear/Nose/Throat: Hearing grossly intact, dentition good Eyes: Sclera non-icteric. Conjunctiva clear Neck: Supple. Trachea midline Pulmonary:  Good air movement, no use of accessory muscles, respirations not labored.  Cardiac: RRR, No JVD Vascular: Varicosities scattered and measuring up to 1-2 mm in the right lower extremity        Varicosities diffuse and more pronounced behind  the knee and measuring up to 2-3 mm in the left lower extremity Vessel Right Left  Radial Palpable Palpable                          PT Palpable Palpable  DP Palpable Palpable   Gastrointestinal: soft, non-tender/non-distended.  Musculoskeletal: M/S 5/5 throughout.   No RLE edema.  Trace LLE edema Neurologic: Sensation grossly intact in extremities.  Symmetrical.  Speech  is fluent.  Psychiatric: Judgment intact, Mood & affect appropriate for pt's clinical situation. Dermatologic: No rashes or ulcers noted.  No cellulitis or open wounds.    Radiology No results found.  Labs Recent Results (from the past 2160 hour(s))  Lipid Profile     Status: Abnormal   Collection Time: 02/21/19  9:50 AM  Result Value Ref Range   Cholesterol 150 <200 mg/dL   HDL 49 (L) > OR = 50 mg/dL   Triglycerides 151 (H) <150 mg/dL   LDL Cholesterol (Calc) 76 mg/dL (calc)    Comment: Reference range: <100 . Desirable range <100 mg/dL for primary prevention;   <70 mg/dL for patients with CHD or diabetic patients  with > or = 2 CHD risk factors. Marland Kitchen LDL-C is now calculated using the Martin-Hopkins  calculation, which is a validated novel method providing  better accuracy than the Friedewald equation in the  estimation of LDL-C.  Cresenciano Genre et al. Annamaria Helling. 7673;419(37): 2061-2068  (http://education.[facility].com/faq/FAQ164)    Total CHOL/HDL Ratio 3.1 <5.0 (calc)   Non-HDL Cholesterol (Calc) 101 <130 mg/dL (calc)    Comment: For patients with diabetes plus 1 major ASCVD risk  factor, treating to a non-HDL-C goal of <100 mg/dL  (LDL-C of <70 mg/dL) is considered a therapeutic  option.   HgB A1c     Status: Abnormal   Collection Time: 02/21/19  9:50 AM  Result Value Ref Range   Hgb A1c MFr Bld 6.0 (H) <5.7 % of total Hgb    Comment: For someone without known diabetes, a hemoglobin  A1c value between 5.7% and 6.4% is consistent with prediabetes and should be confirmed with a  follow-up test.  . For someone with known diabetes, a value <7% indicates that their diabetes is well controlled. A1c targets should be individualized based on duration of diabetes, age, comorbid conditions, and other considerations. . This assay result is consistent with an increased risk of diabetes. . Currently, no consensus exists regarding use of hemoglobin A1c for diagnosis of diabetes for children. .    Mean Plasma Glucose 126 (calc)   eAG (mmol/L) 7.0 (calc)  Comprehensive Metabolic Panel (CMET)     Status: Abnormal   Collection Time: 02/21/19  9:50 AM  Result Value Ref Range   Glucose, Bld 113 (H) 65 - 99 mg/dL    Comment: For someone without known diabetes, a glucose value between 100 and 125 mg/dL is consistent with prediabetes and should be confirmed with a follow-up test. . .            Fasting reference interval .    BUN 20 7 - 25 mg/dL   Creat 0.92 0.50 - 1.05 mg/dL    Comment: For patients >48 years of age, the reference limit for Creatinine is approximately 13% higher for people identified as African-American. .    BUN/Creatinine Ratio NOT APPLICABLE 6 - 22 (calc)   Sodium 142 135 - 146 mmol/L   Potassium 3.3 (L) 3.5 - 5.3 mmol/L   Chloride 105 98 - 110 mmol/L   CO2 27 20 - 32 mmol/L   Calcium 9.4 8.6 - 10.4 mg/dL   Total Protein 7.0 6.1 - 8.1 g/dL   Albumin 4.3 3.6 - 5.1 g/dL   Globulin 2.7 1.9 - 3.7 g/dL (calc)   AG Ratio 1.6 1.0 - 2.5 (calc)   Total Bilirubin 0.5 0.2 - 1.2 mg/dL   Alkaline phosphatase (APISO) 87 37 - 153 U/L   AST 21 10 - 35  U/L   ALT 25 6 - 29 U/L    Assessment/Plan:  Hypertension goal BP (blood pressure) < 140/90 blood pressure control important in reducing the progression of atherosclerotic disease. On appropriate oral medications.   Hypercholesterolemia with hypertriglyceridemia lipid control important in reducing the progression of atherosclerotic disease. Continue statin therapy   Varicose veins of leg with pain, left   The  patient has symptoms consistent with chronic venous insufficiency. We discussed the natural history and treatment options for venous disease. I recommended the regular use of 20 - 30 mm Hg compression stockings. I recommended leg elevation and anti-inflammatories as needed for pain. I have also recommended a complete venous duplex to assess the venous system for reflux or thrombotic issues. This can be done at the patient's convenience. I will see the patient back after the duplex to assess the response to conservative management, and determine further treatment options.     Leotis Pain 04/23/2019, 2:58 PM   This note was created with Dragon medical transcription system.  Any errors from dictation are unintentional.

## 2019-04-23 NOTE — Assessment & Plan Note (Signed)
blood pressure control important in reducing the progression of atherosclerotic disease. On appropriate oral medications.  

## 2019-04-30 ENCOUNTER — Encounter (INDEPENDENT_AMBULATORY_CARE_PROVIDER_SITE_OTHER): Payer: Self-pay | Admitting: Nurse Practitioner

## 2019-04-30 ENCOUNTER — Ambulatory Visit (INDEPENDENT_AMBULATORY_CARE_PROVIDER_SITE_OTHER): Payer: BC Managed Care – PPO

## 2019-04-30 ENCOUNTER — Ambulatory Visit (INDEPENDENT_AMBULATORY_CARE_PROVIDER_SITE_OTHER): Payer: BC Managed Care – PPO | Admitting: Nurse Practitioner

## 2019-04-30 ENCOUNTER — Other Ambulatory Visit: Payer: Self-pay

## 2019-04-30 VITALS — BP 124/71 | HR 53 | Resp 14 | Ht 62.0 in | Wt 180.0 lb

## 2019-04-30 DIAGNOSIS — E782 Mixed hyperlipidemia: Secondary | ICD-10-CM

## 2019-04-30 DIAGNOSIS — I83812 Varicose veins of left lower extremities with pain: Secondary | ICD-10-CM

## 2019-04-30 DIAGNOSIS — K219 Gastro-esophageal reflux disease without esophagitis: Secondary | ICD-10-CM

## 2019-04-30 DIAGNOSIS — I10 Essential (primary) hypertension: Secondary | ICD-10-CM | POA: Diagnosis not present

## 2019-04-30 NOTE — Progress Notes (Signed)
SUBJECTIVE:  Patient ID: Regina Dunlap, female    DOB: [birth year], 58 y.o.   MRN: [id number] Chief Complaint  Patient presents with  . Follow-up    ultrasound    HPI  Regina Dunlap is a 58 y.o. female The patient returns for followup evaluation after the initial visit. The patient continues to have pain in the lower extremities with dependency. The pain is lessened with elevation.  The patient has been wearing medical grade 1 compression stockings since they were prescribed by her primary care physician, Dr. Nadine Counts, on October 13, 2015.  Graduated compression stockings, Class I (20-30 mmHg), have been worn but the stockings do not eliminate the leg pain. Over-the-counter analgesics do not improve the symptoms. The degree of discomfort continues to interfere with daily activities. The patient notes the pain in the legs is causing problems with daily exercise, at the workplace and even with household activities and maintenance such as standing in the kitchen preparing meals and doing dishes.  Despite wearing medical grade 1 compression stockings using conservative therapy for the last several years the patient has had lifestyle limiting pain that is now no longer able to be controlled despite conservative therapy.  Venous ultrasound shows normal deep venous system, no evidence of acute or chronic DVT.  Superficial reflux is present in the left great saphenous vein  Past Medical History:  Diagnosis Date  . Esophageal reflux   . Fatty liver 11/04/2016  . Mastodynia   . Obstructive sleep apnea (adult) (pediatric)   . Other and unspecified hyperlipidemia   . Overweight (BMI 25.0-29.9) 11/04/2016  . Pain in thoracic spine   . Prediabetes 03/28/2016  . Unspecified essential hypertension   . Unspecified sleep apnea     Past Surgical History:  Procedure Laterality Date  . BREAST BIOPSY Right 1999   neg  . BREAST CYST ASPIRATION Right    neg  . TUBAL LIGATION      Social  History   Socioeconomic History  . Marital status: Married    Spouse name: Not on file  . Number of children: Not on file  . Years of education: Not on file  . Highest education level: Not on file  Occupational History  . Not on file  Social Needs  . Financial resource strain: Not on file  . Food insecurity    Worry: Not on file    Inability: Not on file  . Transportation needs    Medical: Not on file    Non-medical: Not on file  Tobacco Use  . Smoking status: Never Smoker  . Smokeless tobacco: Never Used  Substance and Sexual Activity  . Alcohol use: Yes    Comment: social  . Drug use: No  . Sexual activity: Yes  Lifestyle  . Physical activity    Days per week: Not on file    Minutes per session: Not on file  . Stress: Not on file  Relationships  . Social Herbalist on phone: Not on file    Gets together: Not on file    Attends religious service: Not on file    Active member of club or organization: Not on file    Attends meetings of clubs or organizations: Not on file    Relationship status: Not on file  . Intimate partner violence    Fear of current or ex partner: Not on file    Emotionally abused: Not on file    Physically abused: Not  on file    Forced sexual activity: Not on file  Other Topics Concern  . Not on file  Social History Narrative  . Not on file    Family History  Problem Relation Age of Onset  . Migraines Mother   . Stroke Father   . Hypertension Father   . Hypertension Sister   . Anuerysm Brother   . Hypertension Sister   . Cancer Sister        cervical  . Cancer Sister        colon  . Breast cancer Neg Hx     Allergies  Allergen Reactions  . Ciprofloxacin   . Lisinopril   . Migraine Formula [Aspirin-Acetaminophen-Caffeine]     Midrina; broke out in rash   . Povidone Iodine Rash     Review of Systems   Review of Systems: Negative Unless Checked Constitutional: [] Weight loss  [] Fever  [] Chills Cardiac: [] Chest  pain   []  Atrial Fibrillation  [] Palpitations   [] Shortness of breath when laying flat   [] Shortness of breath with exertion. [] Shortness of breath at rest Vascular:  [] Pain in legs with walking   [] Pain in legs with standing [] Pain in legs when laying flat   [] Claudication    [] Pain in feet when laying flat    [] History of DVT   [] Phlebitis   [x] Swelling in legs   [x] Varicose veins   [] Non-healing ulcers Pulmonary:   [] Uses home oxygen   [] Productive cough   [] Hemoptysis   [] Wheeze  [] COPD   [] Asthma Neurologic:  [] Dizziness   [] Seizures  [] Blackouts [] History of stroke   [] History of TIA  [] Aphasia   [] Temporary Blindness   [] Weakness or numbness in arm   [] Weakness or numbness in leg Musculoskeletal:   [] Joint swelling   [] Joint pain   [] Low back pain  []  History of Knee Replacement [x] Arthritis [] back Surgeries  []  Spinal Stenosis    Hematologic:  [] Easy bruising  [] Easy bleeding   [] Hypercoagulable state   [] Anemic Gastrointestinal:  [] Diarrhea   [] Vomiting  [x] Gastroesophageal reflux/heartburn   [] Difficulty swallowing. [] Abdominal pain Genitourinary:  [] Chronic kidney disease   [] Difficult urination  [] Anuric   [] Blood in urine [] Frequent urination  [] Burning with urination   [] Hematuria Skin:  [] Rashes   [] Ulcers [] Wounds Psychological:  [] History of anxiety   []  History of major depression  []  Memory Difficulties      OBJECTIVE:   Physical Exam  BP 124/71 (BP Location: Right Arm)   Pulse (!) 53   Resp 14   Ht 5\' 2"  (1.575 m)   Wt 180 lb (81.6 kg)   BMI 32.92 kg/m   Gen: WD/WN, NAD Head: [place]/AT, No temporalis wasting.  Ear/Nose/Throat: Hearing grossly intact, nares w/o erythema or drainage Eyes: PER, EOMI, sclera nonicteric.  Neck: Supple, no masses.  No JVD.  Pulmonary:  Good air movement, no use of accessory muscles.  Cardiac: RRR Vascular: Large varicosities present extensively greater than 10 mm .  Mild venous stasis changes to the legs bilaterally.  2+ soft pitting edema  Vessel Right Left  Radial Palpable Palpable   Gastrointestinal: soft, non-distended. No guarding/no peritoneal signs.  Musculoskeletal: M/S 5/5 throughout.  No deformity or atrophy.  Neurologic: Pain and light touch intact in extremities.  Symmetrical.  Speech is fluent. Motor exam as listed above. Psychiatric: Judgment intact, Mood & affect appropriate for pt's clinical situation. Dermatologic: No Venous rashes. No Ulcers Noted.  No changes consistent with cellulitis. Lymph : No Cervical lymphadenopathy, no  lichenification or skin changes of chronic lymphedema.       ASSESSMENT AND PLAN:  1. Varicose veins of leg with pain, left Recommend  I have reviewed my previous  discussion with the patient regarding  varicose veins and why they cause symptoms. Patient will continue  wearing graduated compression stockings class 1 on a daily basis, beginning first thing in the morning and removing them in the evening.    In addition, behavioral modification including elevation during the day was again discussed and this will continue.  The patient has utilized over the counter pain medications and has been exercising.  However, at this time conservative therapy has not alleviated the patient's symptoms of leg pain and swelling  Recommend: laser ablation of the  left great saphenous veins to eliminate the symptoms of pain and swelling of the lower extremities caused by the severe superficial venous reflux disease.   2. Hypertension goal BP (blood pressure) < 140/90 Continue antihypertensive medications as already ordered, these medications have been reviewed and there are no changes at this time.   3. GERD without esophagitis Continue PPI as already ordered, this medication has been reviewed and there are no changes at this time.  Avoidence of caffeine and alcohol  Moderate elevation of the head of the bed   4. Hypercholesterolemia with hypertriglyceridemia Continue statin as ordered and  reviewed, no changes at this time    Current Outpatient Medications on File Prior to Visit  Medication Sig Dispense Refill  . aspirin 81 MG tablet Take 81 mg by mouth daily.    . chlorthalidone (HYGROTON) 25 MG tablet TAKE 1 TABLET BY MOUTH EVERY DAY 90 tablet 0  . fluticasone (FLONASE) 50 MCG/ACT nasal spray Place 2 sprays into both nostrils daily. 16 g 6  . gabapentin (NEURONTIN) 100 MG capsule Take 100 mg by mouth daily.    Javier Docker Oil 1000 MG CAPS Take 1 capsule daily by mouth.     . loratadine (CLARITIN) 10 MG tablet Take 1 tablet (10 mg total) by mouth daily as needed for allergies. 30 tablet 11  . losartan (COZAAR) 100 MG tablet Take 1 tablet (100 mg total) by mouth daily. 90 tablet 1  . Magnesium 100 MG CAPS Take 1,000 mg by mouth.    . Multiple Minerals-Vitamins (CALCIUM-MAGNESIUM-ZINC-D3) TABS Take 2 tablets by mouth daily.    . potassium chloride (K-DUR) 10 MEQ tablet TAKE 1 TABLET BY MOUTH 3 TIMES A WEEK 39 tablet 0  . simvastatin (ZOCOR) 40 MG tablet TAKE 1 TABLET BY MOUTH EVERY NIGHT AT BEDTIME 90 tablet 1  . TURMERIC PO Take by mouth daily. Reported on 03/28/2016    . Aspirin-Calcium Carbonate (BAYER WOMENS) [phone] MG TABS Take by mouth.    . famotidine (PEPCID) 20 MG tablet TK 1 T PO BID PRN HEARTBURN OR INDIGESTION    . omega-3 acid ethyl esters (LOVAZA) 1 g capsule Take 1 capsule (1 g total) by mouth 2 (two) times daily. (Patient not taking: Reported on 04/23/2019) 180 capsule 1   No current facility-administered medications on file prior to visit.     There are no Patient Instructions on file for this visit. No follow-ups on file.   Kris Hartmann, NP  This note was completed with Sales executive.  Any errors are purely unintentional.

## 2019-05-06 ENCOUNTER — Telehealth (INDEPENDENT_AMBULATORY_CARE_PROVIDER_SITE_OTHER): Payer: Self-pay | Admitting: Nurse Practitioner

## 2019-05-06 NOTE — Telephone Encounter (Signed)
Generally this procedure will help with the pain, heaviness, swelling and itchiness that many people experience with varicose veins.  I think that if she wants to try to do compression socks first, that is perfectly reasonable and we can see her in office in three months to determine if compression socks worked or if she would like to move forward with the laser.  She can see Dew.

## 2019-05-06 NOTE — Telephone Encounter (Signed)
I contacted this patient to confirm insurance and she wants to know if the laser procedure will "fix" her problem. She states that if not she wants to move forward with trying the compression stocking first.

## 2019-05-20 DIAGNOSIS — G4733 Obstructive sleep apnea (adult) (pediatric): Secondary | ICD-10-CM | POA: Diagnosis not present

## 2019-06-02 ENCOUNTER — Other Ambulatory Visit: Payer: Self-pay | Admitting: Family Medicine

## 2019-06-24 ENCOUNTER — Other Ambulatory Visit: Payer: Self-pay

## 2019-06-24 ENCOUNTER — Ambulatory Visit (INDEPENDENT_AMBULATORY_CARE_PROVIDER_SITE_OTHER): Payer: BLUE CROSS/BLUE SHIELD

## 2019-06-24 DIAGNOSIS — Z23 Encounter for immunization: Secondary | ICD-10-CM

## 2019-06-25 MED ORDER — FAMOTIDINE 20 MG PO TABS: 20.0000 mg | ORAL_TABLET | Freq: Two times a day (BID) | ORAL | 1 refills | Status: DC | PRN

## 2019-06-26 ENCOUNTER — Telehealth: Payer: Self-pay | Admitting: Family Medicine

## 2019-06-26 MED ORDER — CHLORTHALIDONE 25 MG PO TABS: 25.0000 mg | ORAL_TABLET | Freq: Every day | ORAL | 0 refills | Status: DC

## 2019-06-26 NOTE — Telephone Encounter (Signed)
Medication refill: chlorthalidone (HYGROTON) 25 MG tablet [id number]    Pharmacy:  [facility] [id number], [place] AT [place] [phone] (Phone) [phone] (Fax)   Pt aware of turn around time.    Pt out since 06/23/19. Please advise

## 2019-07-15 ENCOUNTER — Ambulatory Visit
Admission: RE | Admit: 2019-07-15 | Discharge: 2019-07-15 | Disposition: A | Discharge status: Home / Self Care | Payer: BC Managed Care – PPO | Source: Ambulatory Visit | Attending: Family Medicine | Admitting: Family Medicine

## 2019-07-15 ENCOUNTER — Other Ambulatory Visit: Payer: Self-pay

## 2019-07-15 ENCOUNTER — Ambulatory Visit (INDEPENDENT_AMBULATORY_CARE_PROVIDER_SITE_OTHER): Payer: BC Managed Care – PPO | Admitting: Family Medicine

## 2019-07-15 ENCOUNTER — Encounter: Payer: Self-pay | Admitting: Family Medicine

## 2019-07-15 VITALS — BP 118/72 | HR 92 | Temp 97.8°F | Resp 14 | Ht 62.0 in | Wt 181.9 lb

## 2019-07-15 DIAGNOSIS — I10 Essential (primary) hypertension: Secondary | ICD-10-CM

## 2019-07-15 DIAGNOSIS — R0602 Shortness of breath: Secondary | ICD-10-CM

## 2019-07-15 DIAGNOSIS — I8393 Asymptomatic varicose veins of bilateral lower extremities: Secondary | ICD-10-CM

## 2019-07-15 DIAGNOSIS — Z1389 Encounter for screening for other disorder: Secondary | ICD-10-CM

## 2019-07-15 DIAGNOSIS — E66811 Obesity, class 1: Secondary | ICD-10-CM

## 2019-07-15 DIAGNOSIS — E782 Mixed hyperlipidemia: Secondary | ICD-10-CM

## 2019-07-15 DIAGNOSIS — K760 Fatty (change of) liver, not elsewhere classified: Secondary | ICD-10-CM

## 2019-07-15 DIAGNOSIS — R232 Flushing: Secondary | ICD-10-CM

## 2019-07-15 DIAGNOSIS — R7303 Prediabetes: Secondary | ICD-10-CM

## 2019-07-15 DIAGNOSIS — Z1231 Encounter for screening mammogram for malignant neoplasm of breast: Secondary | ICD-10-CM

## 2019-07-15 DIAGNOSIS — E669 Obesity, unspecified: Secondary | ICD-10-CM

## 2019-07-15 NOTE — Progress Notes (Signed)
Name: Regina Dunlap   MRN: [id number]    DOB: [birth year]   Date:07/15/2019       Progress Note  Subjective  Chief Complaint  Chief Complaint  Patient presents with  . Referral    for mammogram    HPI  HTN:  She denies chest pain, shortness of breath, or BLE edema.  She is taking losartan 100mg , chlorthalidone 25mg  along with potassium 74mEq.  Fatty Liver: She is eating healthy diet; compliant with simvastatin. Lovaza was too expensive, so she is taking OTC fill oil.  Has occasional RUQ pain still ongoing for a few years now.  Varicose Veins: She has pain with her varicose veins.  She has seen vein and vascular in the past, but did not want to proceed with surgery.  She has changed her shoes to be more supportive.  Also recommended compression stockings as well.  Has follow up in November 2020.   HLD: Compliant with simvastatin. Lovaza was too expensive, so she is taking OTC fill oil. Denies myalgias. Taking 81mg  ASA.   Shortness of breath: She will hear this every now and then in the chest.  Non-smoker. No history of asthma. She states intermittently for about a year.  She notes that she gets winded going up stairs with more than 1 story to climb at a time (daughter's apartment complex).  She has seen Dr. Fletcher Anon in the past, was told to follow up as needed - advised to call and schedule appt for evaluation. Denies chest pain, occasional ache in the chest - often more with stress and anxiety.   Breast Cancer Screening: Needs mammogram.  Hot Flashes: She has not had a period since her mid to late 67's.  She is still having some hot flashes now which have been ongoing; they are improving slowly. She notes that eating extra sugar will cause increased hot flashes.   Obesity/Prediabetes: She would like to see nutritionist for help.  She does not exercise often; her diet is balanced, but would like some help making sure she is eating the best options.  Denies polyuria, polydipsia,  polyphagia.  Abnormal Nevi: She has mole to center of her chest present for a few years; she has history of a lot of sun exposure.  This mole is raised, sometimes itches and becomes inflamed, sometimes causes no issues.  She would like referral to dermatologist for skin survey.  Patient Active Problem List   Diagnosis Date Noted  . Varicose veins of leg with pain, left 04/23/2019  . Hypokalemia 11/23/2017  . Postmenopausal bleeding 10/17/2017  . Varicose veins of both lower extremities without ulcer or inflammation 04/27/2017  . Sleep apnea 04/24/2017  . Preventative health care 04/24/2017  . Obesity (BMI 30.0-34.9) 11/04/2016  . Fatty liver 11/04/2016  . History of concussion 06/07/2016  . MCI (mild cognitive impairment) 06/07/2016  . Medication monitoring encounter 03/28/2016  . Prediabetes 03/28/2016  . Memory impairment 03/28/2016  . Decreased sense of smell 03/28/2016  . Hypercholesterolemia with hypertriglyceridemia 10/13/2015  . Encounter for screening mammogram for malignant neoplasm of breast 10/13/2015  . GERD without esophagitis 10/13/2015  . Screening for STD (sexually transmitted disease) 10/13/2015  . Allergic rhinitis 09/16/2015  . Lipoma of arm 09/16/2015  . Family history of colon cancer 09/16/2015  . Hypertension goal BP (blood pressure) < 140/90     Past Surgical History:  Procedure Laterality Date  . BREAST BIOPSY Right 1999   neg  . BREAST CYST ASPIRATION Right  neg  . TUBAL LIGATION      Family History  Problem Relation Age of Onset  . Migraines Mother   . Stroke Father   . Hypertension Father   . Hypertension Sister   . Anuerysm Brother   . Hypertension Sister   . Cancer Sister        cervical  . Cancer Sister        colon  . Breast cancer Neg Hx     Social History   Socioeconomic History  . Marital status: Married    Spouse name: Not on file  . Number of children: Not on file  . Years of education: Not on file  . Highest education  level: Not on file  Occupational History  . Not on file  Social Needs  . Financial resource strain: Not on file  . Food insecurity    Worry: Not on file    Inability: Not on file  . Transportation needs    Medical: Not on file    Non-medical: Not on file  Tobacco Use  . Smoking status: Never Smoker  . Smokeless tobacco: Never Used  Substance and Sexual Activity  . Alcohol use: Yes    Comment: social  . Drug use: No  . Sexual activity: Yes  Lifestyle  . Physical activity    Days per week: Not on file    Minutes per session: Not on file  . Stress: Not on file  Relationships  . Social Herbalist on phone: Not on file    Gets together: Not on file    Attends religious service: Not on file    Active member of club or organization: Not on file    Attends meetings of clubs or organizations: Not on file    Relationship status: Not on file  . Intimate partner violence    Fear of current or ex partner: Not on file    Emotionally abused: Not on file    Physically abused: Not on file    Forced sexual activity: Not on file  Other Topics Concern  . Not on file  Social History Narrative  . Not on file     Current Outpatient Medications:  .  aspirin 81 MG tablet, Take 81 mg by mouth daily., Disp: , Rfl:  .  Aspirin-Calcium Carbonate (BAYER WOMENS) [phone] MG TABS, Take by mouth., Disp: , Rfl:  .  chlorthalidone (HYGROTON) 25 MG tablet, Take 1 tablet (25 mg total) by mouth daily., Disp: 90 tablet, Rfl: 0 .  famotidine (PEPCID) 20 MG tablet, Take 1 tablet (20 mg total) by mouth 2 (two) times daily as needed for heartburn or indigestion., Disp: 90 tablet, Rfl: 1 .  fluticasone (FLONASE) 50 MCG/ACT nasal spray, Place 2 sprays into both nostrils daily., Disp: 16 g, Rfl: 6 .  gabapentin (NEURONTIN) 100 MG capsule, Take 100 mg by mouth daily., Disp: , Rfl:  .  loratadine (CLARITIN) 10 MG tablet, Take 1 tablet (10 mg total) by mouth daily as needed for allergies., Disp: 30 tablet,  Rfl: 11 .  losartan (COZAAR) 100 MG tablet, TAKE 1 TABLET(100 MG) BY MOUTH DAILY, Disp: 90 tablet, Rfl: 1 .  Magnesium 100 MG CAPS, Take 1,000 mg by mouth., Disp: , Rfl:  .  Multiple Minerals-Vitamins (CALCIUM-MAGNESIUM-ZINC-D3) TABS, Take 2 tablets by mouth daily., Disp: , Rfl:  .  omega-3 acid ethyl esters (LOVAZA) 1 g capsule, Take 1 capsule (1 g total) by mouth 2 (two) times  daily., Disp: 180 capsule, Rfl: 1 .  potassium chloride (K-DUR) 10 MEQ tablet, TAKE 1 TABLET BY MOUTH 3 TIMES A WEEK, Disp: 39 tablet, Rfl: 0 .  simvastatin (ZOCOR) 40 MG tablet, TAKE 1 TABLET BY MOUTH EVERY NIGHT AT BEDTIME, Disp: 90 tablet, Rfl: 1 .  TURMERIC PO, Take by mouth daily. Reported on 03/28/2016, Disp: , Rfl:  .  Krill Oil 1000 MG CAPS, Take 1 capsule daily by mouth. , Disp: , Rfl:   Allergies  Allergen Reactions  . Ciprofloxacin   . Lisinopril   . Migraine Formula [Aspirin-Acetaminophen-Caffeine]     Midrina; broke out in rash   . Povidone Iodine Rash    I personally reviewed active problem list, medication list, allergies, health maintenance, notes from last encounter, lab results with the patient/caregiver today.   ROS Ten systems reviewed and is negative except as mentioned in HPI  Objective  Vitals:   07/15/19 0808  BP: 118/72  Pulse: 92  Resp: 14  Temp: 97.8 F (36.6 C)  TempSrc: Oral  SpO2: 99%  Weight: 181 lb 14.4 oz (82.5 kg)  Height: 5\' 2"  (1.575 m)    Body mass index is 33.27 kg/m.  Physical Exam  Constitutional: Patient appears well-developed and well-nourished. No distress.  HENT: Head: Normocephalic and atraumatic.  Eyes: Conjunctivae and EOM are normal. No scleral icterus.  Neck: Normal range of motion. Neck supple. No JVD present.  Cardiovascular: Normal rate, regular rhythm and normal heart sounds.  No murmur heard. No BLE edema. Pulmonary/Chest: Effort normal and breath sounds normal. No respiratory distress. Abdominal: Soft. Bowel sounds are normal, no  distension. There is no tenderness. No masses. Musculoskeletal: Normal range of motion, no joint effusions. No gross deformities Neurological: Pt is alert and oriented to person, place, and time. No cranial nerve deficit. Coordination, balance, strength, speech and gait are normal.  Skin: Skin is warm and dry. No rash noted. No erythema.  She has many nevi that are flat, hyperpigmented lesions that have rounded well demarcated borders on the chest and upper back.  There is a single raised, non-scaling hyperpigmented lesion that is well demarcated to the central upper chest. Psychiatric: Patient has a normal mood and affect. behavior is normal. Judgment and thought content normal.  No results found for this or any previous visit (from the past 72 hour(s)).  PHQ2/9: Depression screen [facility] 2/9 07/15/2019 02/06/2019 12/07/2018 09/18/2018 07/11/2018  Decreased Interest 0 0 0 0 0  Down, Depressed, Hopeless 0 0 0 0 0  PHQ - 2 Score 0 0 0 0 0  Altered sleeping 0 0 0 0 -  Tired, decreased energy 0 0 0 0 -  Change in appetite 0 0 0 0 -  Feeling bad or failure about yourself  0 0 0 0 -  Trouble concentrating 0 0 0 0 -  Moving slowly or fidgety/restless 0 0 0 0 -  Suicidal thoughts 0 0 0 0 -  PHQ-9 Score 0 0 0 0 -  Difficult doing work/chores Not difficult at all Not difficult at all Not difficult at all Not difficult at all -   PHQ-2/9 Result is negative.    Fall Risk: Fall Risk  07/15/2019 02/06/2019 12/07/2018 09/18/2018 07/11/2018  Falls in the past year? 0 1 0 1 No  Number falls in past yr: 0 0 0 0 -  Injury with Fall? 0 0 0 1 -  Follow up Falls evaluation completed - - - -    Assessment &  Plan  1. Hypertension goal BP (blood pressure) < 140/90 - Stable and at goal with current regimen; DASH diet.  2. Fatty liver - Continue fish oil and statin therapy; labs at next visit - Amb ref to Medical Nutrition Therapy-MNT  3. Hypercholesterolemia with hypertriglyceridemia - Continue fish oil  and statin therapy; labs at next visit - Amb ref to Medical Nutrition Therapy-MNT  4. Varicose veins of both lower extremities without ulcer or inflammation - Seeing vein and vascular; compression stockings and supportive shoes  5. Shortness of breath - She will call Dr. Tyrell Antonio office for cardiac eval of her concerns as she is established with him.  If she needs additional follow up from a respiratory aspect, she will let our office know.  6. Encounter for screening mammogram for malignant neoplasm of breast - MM 3D SCREEN BREAST BILATERAL; Future  7. Hot flashes - Improving.  8. Obesity (BMI 30.0-34.9) - Discussed importance of 150 minutes of physical activity weekly, eat two servings of fish weekly, eat one serving of tree nuts ( cashews, pistachios, pecans, almonds.Marland Kitchen) every other day, eat 6 servings of fruit/vegetables daily and drink plenty of water and avoid sweet beverages.  - Amb ref to Medical Nutrition Therapy-MNT  9. Prediabetes - Amb ref to Medical Nutrition Therapy-MNT  10. Encounter for surveillance of abnormal nevi - Ambulatory referral to Dermatology

## 2019-07-20 DIAGNOSIS — G4733 Obstructive sleep apnea (adult) (pediatric): Secondary | ICD-10-CM | POA: Diagnosis not present

## 2019-07-25 DIAGNOSIS — D229 Melanocytic nevi, unspecified: Secondary | ICD-10-CM | POA: Diagnosis not present

## 2019-07-25 DIAGNOSIS — L821 Other seborrheic keratosis: Secondary | ICD-10-CM | POA: Diagnosis not present

## 2019-07-25 DIAGNOSIS — L814 Other melanin hyperpigmentation: Secondary | ICD-10-CM | POA: Diagnosis not present

## 2019-07-25 DIAGNOSIS — D492 Neoplasm of unspecified behavior of bone, soft tissue, and skin: Secondary | ICD-10-CM | POA: Diagnosis not present

## 2019-07-25 DIAGNOSIS — L820 Inflamed seborrheic keratosis: Secondary | ICD-10-CM | POA: Diagnosis not present

## 2019-08-09 ENCOUNTER — Ambulatory Visit: Payer: BLUE CROSS/BLUE SHIELD | Admitting: Family Medicine

## 2019-08-20 DIAGNOSIS — G4733 Obstructive sleep apnea (adult) (pediatric): Secondary | ICD-10-CM | POA: Diagnosis not present

## 2019-08-30 ENCOUNTER — Other Ambulatory Visit: Payer: Self-pay

## 2019-08-30 DIAGNOSIS — R0981 Nasal congestion: Secondary | ICD-10-CM

## 2019-09-01 MED ORDER — FLUTICASONE PROPIONATE 50 MCG/ACT NA SUSP: 2.0000 | Freq: Every day | NASAL | 6 refills | Status: DC

## 2019-09-17 ENCOUNTER — Other Ambulatory Visit: Payer: Self-pay | Admitting: Family Medicine

## 2019-09-19 DIAGNOSIS — G4733 Obstructive sleep apnea (adult) (pediatric): Secondary | ICD-10-CM | POA: Diagnosis not present

## 2019-09-22 ENCOUNTER — Other Ambulatory Visit: Payer: Self-pay | Admitting: Family Medicine

## 2019-09-23 ENCOUNTER — Telehealth: Payer: Self-pay | Admitting: Family Medicine

## 2019-09-23 NOTE — Telephone Encounter (Signed)
Patient is calling to request approval for the shingles vaccine please advise [phone]

## 2019-09-23 NOTE — Telephone Encounter (Signed)
Patient has to call her insurance company to get approval if they say its ok she can do a nurse visit

## 2019-09-23 NOTE — Telephone Encounter (Signed)
Called pt and gave her the info to see if ins will cover and if so we can schedule

## 2019-09-24 NOTE — Telephone Encounter (Signed)
Pt said she call her insurance company and shingle vaccine is covered at 100 percent. Pt has been sch for nurse visit

## 2019-09-25 ENCOUNTER — Other Ambulatory Visit: Payer: Self-pay

## 2019-09-25 ENCOUNTER — Ambulatory Visit (INDEPENDENT_AMBULATORY_CARE_PROVIDER_SITE_OTHER): Payer: BC Managed Care – PPO

## 2019-09-25 DIAGNOSIS — Z23 Encounter for immunization: Secondary | ICD-10-CM | POA: Diagnosis not present

## 2019-10-15 ENCOUNTER — Ambulatory Visit
Admission: RE | Admit: 2019-10-15 | Discharge: 2019-10-15 | Disposition: A | Discharge status: Home / Self Care | Payer: BC Managed Care – PPO | Source: Ambulatory Visit | Attending: Family Medicine | Admitting: Family Medicine

## 2019-10-15 ENCOUNTER — Encounter: Payer: Self-pay | Admitting: Family Medicine

## 2019-10-15 ENCOUNTER — Other Ambulatory Visit: Payer: Self-pay

## 2019-10-15 ENCOUNTER — Ambulatory Visit (INDEPENDENT_AMBULATORY_CARE_PROVIDER_SITE_OTHER): Payer: BC Managed Care – PPO | Admitting: Family Medicine

## 2019-10-15 ENCOUNTER — Ambulatory Visit
Admission: RE | Admit: 2019-10-15 | Discharge: 2019-10-15 | Disposition: A | Discharge status: Home / Self Care | Payer: BC Managed Care – PPO | Attending: Family Medicine | Admitting: Family Medicine

## 2019-10-15 VITALS — BP 118/84 | HR 93 | Temp 97.8°F | Resp 16 | Ht 62.0 in | Wt 170.9 lb

## 2019-10-15 DIAGNOSIS — I8393 Asymptomatic varicose veins of bilateral lower extremities: Secondary | ICD-10-CM | POA: Diagnosis not present

## 2019-10-15 DIAGNOSIS — E782 Mixed hyperlipidemia: Secondary | ICD-10-CM

## 2019-10-15 DIAGNOSIS — R7303 Prediabetes: Secondary | ICD-10-CM | POA: Diagnosis not present

## 2019-10-15 DIAGNOSIS — Z8639 Personal history of other endocrine, nutritional and metabolic disease: Secondary | ICD-10-CM

## 2019-10-15 DIAGNOSIS — R0602 Shortness of breath: Secondary | ICD-10-CM

## 2019-10-15 DIAGNOSIS — E669 Obesity, unspecified: Secondary | ICD-10-CM

## 2019-10-15 DIAGNOSIS — K760 Fatty (change of) liver, not elsewhere classified: Secondary | ICD-10-CM | POA: Diagnosis not present

## 2019-10-15 DIAGNOSIS — R5383 Other fatigue: Secondary | ICD-10-CM

## 2019-10-15 DIAGNOSIS — I10 Essential (primary) hypertension: Secondary | ICD-10-CM | POA: Diagnosis not present

## 2019-10-15 DIAGNOSIS — R232 Flushing: Secondary | ICD-10-CM

## 2019-10-15 MED ORDER — ALBUTEROL SULFATE HFA 108 (90 BASE) MCG/ACT IN AERS: 2.0000 | INHALATION_SPRAY | Freq: Four times a day (QID) | RESPIRATORY_TRACT | 1 refills | Status: DC | PRN

## 2019-10-15 MED ORDER — SIMVASTATIN 40 MG PO TABS: 40.0000 mg | ORAL_TABLET | Freq: Every day | ORAL | 1 refills | Status: DC

## 2019-10-15 MED ORDER — POTASSIUM CHLORIDE ER 10 MEQ PO TBCR: EXTENDED_RELEASE_TABLET | ORAL | 1 refills | Status: DC

## 2019-10-15 MED ORDER — LOSARTAN POTASSIUM 100 MG PO TABS: ORAL_TABLET | ORAL | 1 refills | Status: DC

## 2019-10-15 NOTE — Patient Instructions (Signed)
Silipos or Scar Away - Silicone sticker for scars on the skin

## 2019-10-15 NOTE — Progress Notes (Signed)
Name: Regina Dunlap   MRN: [id number]    DOB: 04/02/1961   Date:10/15/2019       Progress Note  Subjective  Chief Complaint  Chief Complaint  Patient presents with  . Hyperlipidemia    3 month follow up, medication refills  . Hypertension    HPI  HTN:  She denies chest pain, shortness of breath, or BLE edema.  She is taking losartan 100mg , chlorthalidone 25mg  along with potassium 41mEq.  Fatty Liver: She is eating healthy diet; compliant with simvastatin. Lovaza was too expensive, so she is taking OTC fill oil.  Has occasional RUQ pain still ongoing for a few years now.  Varicose Veins: She has pain with her varicose veins.  She has seen vein and vascular in the past, but did not want to proceed with surgery.  She has changed her shoes to be more supportive.  Also recommended compression stockings as well.  No changes, stable.  HLD: Compliant with simvastatin. Lovaza was too expensive, so she is taking OTC fill oil. Denies myalgias. Taking 81mg  ASA.   Shortness of breath: She will have this every now and then in the chest.  Non-smoker. No history of asthma. She states intermittently for about a year now.  She notes that she gets winded going up stairs with more than 1 story to climb at a time (daughter's apartment complex).  She has seen Dr. Fletcher Anon in the past, was told to follow up as needed - will place new referral today.  Denies chest pain, occasional ache in the chest - often more with stress and anxiety.  Has had some intermittent fatigue - will check some labs today.  No palpitations.  We will add albuterol today to see if this helps her symptoms; also will obtain CXR.  If cleared by cardiology, may consider pulmonology referral.  Hot Flashes: She has not had a period since her mid to late 74's.  She is still having some hot flashes now which have been ongoing. She knows that triggers are sweets and alcohol.  Obesity/Prediabetes:  She does not exercise often - states  wants to work on improving this herself; her diet has improved - decreased carbs and fatty foods.  Weight is down about 10lbs.  Denies polyuria, polydipsia, polyphagia.  Patient Active Problem List   Diagnosis Date Noted  . Hot flashes 07/15/2019  . Varicose veins of leg with pain, left 04/23/2019  . Hypokalemia 11/23/2017  . Postmenopausal bleeding 10/17/2017  . Varicose veins of both lower extremities without ulcer or inflammation 04/27/2017  . Sleep apnea 04/24/2017  . Preventative health care 04/24/2017  . Obesity (BMI 30.0-34.9) 11/04/2016  . Fatty liver 11/04/2016  . History of concussion 06/07/2016  . MCI (mild cognitive impairment) 06/07/2016  . Medication monitoring encounter 03/28/2016  . Prediabetes 03/28/2016  . Memory impairment 03/28/2016  . Decreased sense of smell 03/28/2016  . Hypercholesterolemia with hypertriglyceridemia 10/13/2015  . Encounter for screening mammogram for malignant neoplasm of breast 10/13/2015  . GERD without esophagitis 10/13/2015  . Screening for STD (sexually transmitted disease) 10/13/2015  . Allergic rhinitis 09/16/2015  . Lipoma of arm 09/16/2015  . Family history of colon cancer 09/16/2015  . Shortness of breath 05/27/2014  . Hypertension goal BP (blood pressure) < 140/90     Past Surgical History:  Procedure Laterality Date  . BREAST BIOPSY Right 1999   neg  . BREAST CYST ASPIRATION Right    neg  . TUBAL LIGATION  Family History  Problem Relation Age of Onset  . Migraines Mother   . Stroke Father   . Hypertension Father   . Hypertension Sister   . Anuerysm Brother   . Hypertension Sister   . Cancer Sister        cervical  . Cancer Sister        colon  . Breast cancer Neg Hx     Social History   Socioeconomic History  . Marital status: Married    Spouse name: Not on file  . Number of children: Not on file  . Years of education: Not on file  . Highest education level: Not on file  Occupational History  .  Not on file  Tobacco Use  . Smoking status: Never Smoker  . Smokeless tobacco: Never Used  Substance and Sexual Activity  . Alcohol use: Yes    Comment: social  . Drug use: No  . Sexual activity: Yes  Other Topics Concern  . Not on file  Social History Narrative  . Not on file   Social Determinants of Health   Financial Resource Strain:   . Difficulty of Paying Living Expenses: Not on file  Food Insecurity:   . Worried About Charity fundraiser in the Last Year: Not on file  . Ran Out of Food in the Last Year: Not on file  Transportation Needs:   . Lack of Transportation (Medical): Not on file  . Lack of Transportation (Non-Medical): Not on file  Physical Activity:   . Days of Exercise per Week: Not on file  . Minutes of Exercise per Session: Not on file  Stress:   . Feeling of Stress : Not on file  Social Connections:   . Frequency of Communication with Friends and Family: Not on file  . Frequency of Social Gatherings with Friends and Family: Not on file  . Attends Religious Services: Not on file  . Active Member of Clubs or Organizations: Not on file  . Attends Archivist Meetings: Not on file  . Marital Status: Not on file  Intimate Partner Violence:   . Fear of Current or Ex-Partner: Not on file  . Emotionally Abused: Not on file  . Physically Abused: Not on file  . Sexually Abused: Not on file     Current Outpatient Medications:  .  aspirin 81 MG tablet, Take 81 mg by mouth daily., Disp: , Rfl:  .  chlorthalidone (HYGROTON) 25 MG tablet, TAKE 1 TABLET(25 MG) BY MOUTH DAILY, Disp: 90 tablet, Rfl: 0 .  famotidine (PEPCID) 20 MG tablet, TAKE 1 TABLET BY MOUTH TWICE DAILY AS NEEDED FOR HEARTBURN OR INDIGESTION, Disp: 90 tablet, Rfl: 1 .  fluticasone (FLONASE) 50 MCG/ACT nasal spray, Place 2 sprays into both nostrils daily., Disp: 16 g, Rfl: 6 .  Krill Oil 1000 MG CAPS, Take 1 capsule daily by mouth. , Disp: , Rfl:  .  loratadine (CLARITIN) 10 MG tablet,  Take 1 tablet (10 mg total) by mouth daily as needed for allergies., Disp: 30 tablet, Rfl: 11 .  losartan (COZAAR) 100 MG tablet, TAKE 1 TABLET(100 MG) BY MOUTH DAILY, Disp: 90 tablet, Rfl: 1 .  Magnesium 100 MG CAPS, Take 1,000 mg by mouth., Disp: , Rfl:  .  Multiple Minerals-Vitamins (CALCIUM-MAGNESIUM-ZINC-D3) TABS, Take 2 tablets by mouth daily., Disp: , Rfl:  .  potassium chloride (K-DUR) 10 MEQ tablet, TAKE 1 TABLET BY MOUTH 3 TIMES A WEEK, Disp: 39 tablet, Rfl: 0 .  simvastatin (ZOCOR) 40 MG tablet, TAKE 1 TABLET BY MOUTH EVERY NIGHT AT BEDTIME, Disp: 90 tablet, Rfl: 1 .  TURMERIC PO, Take by mouth daily. Reported on 03/28/2016, Disp: , Rfl:  .  gabapentin (NEURONTIN) 100 MG capsule, Take 100 mg by mouth daily., Disp: , Rfl:   Allergies  Allergen Reactions  . Ciprofloxacin   . Lisinopril   . Migraine Formula [Aspirin-Acetaminophen-Caffeine]     Midrina; broke out in rash   . Povidone Iodine Rash    I personally reviewed active problem list, medication list, allergies, health maintenance, notes from last encounter, lab results with the patient/caregiver today.   ROS  Ten systems reviewed and is negative except as mentioned in HPI  Objective  Vitals:   10/15/19 0843  BP: 118/84  Pulse: 93  Resp: 16  Temp: 97.8 F (36.6 C)  TempSrc: Temporal  SpO2: 96%  Weight: 170 lb 14.4 oz (77.5 kg)  Height: 5\' 2"  (1.575 m)   Body mass index is 31.26 kg/m.  Physical Exam  Constitutional: Patient appears well-developed and well-nourished. No distress.  HENT: Head: Normocephalic and atraumatic.  Eyes: Conjunctivae and EOM are normal. No scleral icterus.  Neck: Normal range of motion. Neck supple. No JVD present.  Cardiovascular: Normal rate, regular rhythm and normal heart sounds.  No murmur heard. No BLE edema. Pulmonary/Chest: Effort normal and breath sounds normal. No respiratory distress. Musculoskeletal: Normal range of motion, no joint effusions. No gross  deformities Neurological: Pt is alert and oriented to person, place, and time. No cranial nerve deficit. Coordination, balance, strength, speech and gait are normal.  Skin: Skin is warm and dry. No rash noted. No erythema.  Psychiatric: Patient has a normal mood and affect. behavior is normal. Judgment and thought content normal.  No results found for this or any previous visit (from the past 72 hour(s)).  PHQ2/9: Depression screen [facility] 2/9 10/15/2019 07/15/2019 02/06/2019 12/07/2018 09/18/2018  Decreased Interest 0 0 0 0 0  Down, Depressed, Hopeless 0 0 0 0 0  PHQ - 2 Score 0 0 0 0 0  Altered sleeping 0 0 0 0 0  Tired, decreased energy 0 0 0 0 0  Change in appetite 0 0 0 0 0  Feeling bad or failure about yourself  0 0 0 0 0  Trouble concentrating 0 0 0 0 0  Moving slowly or fidgety/restless 0 0 0 0 0  Suicidal thoughts 0 0 0 0 0  PHQ-9 Score 0 0 0 0 0  Difficult doing work/chores Not difficult at all Not difficult at all Not difficult at all Not difficult at all Not difficult at all   PHQ-2/9 Result is negative.    Fall Risk: Fall Risk  10/15/2019 07/15/2019 02/06/2019 12/07/2018 09/18/2018  Falls in the past year? 0 0 1 0 1  Number falls in past yr: 0 0 0 0 0  Injury with Fall? 0 0 0 0 1  Follow up Falls evaluation completed Falls evaluation completed - - -   Assessment & Plan  1. Hypertension goal BP (blood pressure) < 140/90 - At goal today - potassium chloride (KLOR-CON) 10 MEQ tablet; Take 1 tablet twice a week  Dispense: 24 tablet; Refill: 1 - losartan (COZAAR) 100 MG tablet; TAKE 1 TABLET(100 MG) BY MOUTH DAILY  Dispense: 90 tablet; Refill: 1 - Ambulatory referral to Cardiology - COMPLETE METABOLIC PANEL WITH GFR - TSH  2. Fatty liver - CMP per orders.  3. Hypercholesterolemia with hypertriglyceridemia - simvastatin (  ZOCOR) 40 MG tablet; Take 1 tablet (40 mg total) by mouth at bedtime.  Dispense: 90 tablet; Refill: 1 - Ambulatory referral to Cardiology - Lipid  panel  4. Varicose veins of both lower extremities without ulcer or inflammation - Stable with no changes.  5. Shortness of breath - Albuterol per orders  - DG Chest 2 View; Future - Ambulatory referral to Cardiology  6. Hot flashes - TSH  7. Obesity (BMI 30.0-34.9) - Discussed with the patient the risk posed by an increased BMI. Discussed importance of portion control, calorie counting and at least 150 minutes of physical activity weekly. Avoid sweet beverages and drink more water. Eat at least 6 servings of fruit and vegetables daily  - TSH  8. Prediabetes - Hemoglobin A1c  9. Fatigue, unspecified type - COMPLETE METABOLIC PANEL WITH GFR - Hemoglobin A1c - VITAMIN D 25 Hydroxy (Vit-D Deficiency, Fractures) - B12 and Folate Panel - CBC with Differential/Platelet - TSH  10. History of vitamin D deficiency - VITAMIN D 25 Hydroxy (Vit-D Deficiency, Fractures)

## 2019-10-16 ENCOUNTER — Ambulatory Visit (INDEPENDENT_AMBULATORY_CARE_PROVIDER_SITE_OTHER): Payer: BC Managed Care – PPO | Admitting: Family

## 2019-10-16 ENCOUNTER — Encounter: Payer: Self-pay | Admitting: Family

## 2019-10-16 VITALS — BP 114/78 | HR 66 | Ht 62.0 in | Wt 174.2 lb

## 2019-10-16 DIAGNOSIS — E782 Mixed hyperlipidemia: Secondary | ICD-10-CM | POA: Diagnosis not present

## 2019-10-16 DIAGNOSIS — I10 Essential (primary) hypertension: Secondary | ICD-10-CM | POA: Diagnosis not present

## 2019-10-16 DIAGNOSIS — E876 Hypokalemia: Secondary | ICD-10-CM

## 2019-10-16 DIAGNOSIS — R0600 Dyspnea, unspecified: Secondary | ICD-10-CM | POA: Diagnosis not present

## 2019-10-16 DIAGNOSIS — G4733 Obstructive sleep apnea (adult) (pediatric): Secondary | ICD-10-CM

## 2019-10-16 DIAGNOSIS — R0609 Other forms of dyspnea: Secondary | ICD-10-CM

## 2019-10-16 LAB — COMPLETE METABOLIC PANEL WITH GFR
AG Ratio: 1.4 (calc) (ref 1.0–2.5)
ALT: 18 U/L (ref 6–29)
AST: 19 U/L (ref 10–35)
Albumin: 4.1 g/dL (ref 3.6–5.1)
Alkaline phosphatase (APISO): 90 U/L (ref 37–153)
BUN: 23 mg/dL (ref 7–25)
CO2: 28 mmol/L (ref 20–32)
Calcium: 9.1 mg/dL (ref 8.6–10.4)
Chloride: 102 mmol/L (ref 98–110)
Creat: 1.02 mg/dL (ref 0.50–1.05)
GFR, Est African American: 71 mL/min/{1.73_m2} (ref >=60)
GFR, Est Non African American: 61 mL/min/{1.73_m2} (ref >=60)
Globulin: 2.9 g/dL (calc) (ref 1.9–3.7)
Glucose, Bld: 109 mg/dL — ABNORMAL HIGH (ref 65–99)
Potassium: 3 mmol/L — ABNORMAL LOW (ref 3.5–5.3)
Sodium: 140 mmol/L (ref 135–146)
Total Bilirubin: 0.5 mg/dL (ref 0.2–1.2)
Total Protein: 7 g/dL (ref 6.1–8.1)

## 2019-10-16 LAB — LIPID PANEL
Cholesterol: 158 mg/dL (ref <200)
HDL: 51 mg/dL (ref >=50)
LDL Cholesterol (Calc): 84 mg/dL (calc)
Non-HDL Cholesterol (Calc): 107 mg/dL (calc) (ref <130)
Total CHOL/HDL Ratio: 3.1 (calc) (ref <5.0)
Triglycerides: 135 mg/dL (ref <150)

## 2019-10-16 LAB — VITAMIN D 25 HYDROXY (VIT D DEFICIENCY, FRACTURES): Vit D, 25-Hydroxy: 17 ng/mL — ABNORMAL LOW (ref 30–100)

## 2019-10-16 LAB — CBC WITH DIFFERENTIAL/PLATELET
Absolute Monocytes: 282 cells/uL (ref 200–950)
Basophils Absolute: 52 cells/uL (ref 0–200)
Basophils Relative: 1.1 %
Eosinophils Absolute: 169 cells/uL (ref 15–500)
Eosinophils Relative: 3.6 %
HCT: 38.4 % (ref 35.0–45.0)
Hemoglobin: 12.8 g/dL (ref 11.7–15.5)
Lymphs Abs: 1927 cells/uL (ref 850–3900)
MCH: 28.8 pg (ref 27.0–33.0)
MCHC: 33.3 g/dL (ref 32.0–36.0)
MCV: 86.3 fL (ref 80.0–100.0)
MPV: 10.8 fL (ref 7.5–12.5)
Monocytes Relative: 6 %
Neutro Abs: 2270 cells/uL (ref 1500–7800)
Neutrophils Relative %: 48.3 %
Platelets: 244 10*3/uL (ref 140–400)
RBC: 4.45 10*6/uL (ref 3.80–5.10)
RDW: 12.9 % (ref 11.0–15.0)
Total Lymphocyte: 41 %
WBC: 4.7 10*3/uL (ref 3.8–10.8)

## 2019-10-16 LAB — ADVANCED WRITTEN NOTIFICATION (AWN) TEST REFUSAL

## 2019-10-16 LAB — HEMOGLOBIN A1C
Hgb A1c MFr Bld: 6.2 % of total Hgb — ABNORMAL HIGH (ref <5.7)
Mean Plasma Glucose: 131 (calc)
eAG (mmol/L): 7.3 (calc)

## 2019-10-16 LAB — TSH: TSH: 2.03 mIU/L (ref 0.40–4.50)

## 2019-10-16 NOTE — Progress Notes (Signed)
Office Visit    Patient Name: Regina Dunlap Date of Encounter: 10/16/2019  Primary Care Provider:  Hubbard Hartshorn, FNP Primary Cardiologist:  Kathlyn Sacramento, MD Electrophysiologist:  None   Chief Complaint    Kabao Bissoon Dunlap is a 59 y.o. female with a hx of HTN, fatty liver, varicose veins, HLD, SOB presents today for follow up of HTN, HLD, and SOB.   Past Medical History    Past Medical History:  Diagnosis Date  . Esophageal reflux   . Fatty liver 11/04/2016  . Mastodynia   . Obstructive sleep apnea (adult) (pediatric)   . Other and unspecified hyperlipidemia   . Overweight (BMI 25.0-29.9) 11/04/2016  . Pain in thoracic spine   . Prediabetes 03/28/2016  . Unspecified essential hypertension   . Unspecified sleep apnea    Past Surgical History:  Procedure Laterality Date  . BREAST BIOPSY Right 1999   neg  . BREAST CYST ASPIRATION Right    neg  . TUBAL LIGATION      Allergies  Allergies  Allergen Reactions  . Ciprofloxacin   . Lisinopril   . Migraine Formula [Aspirin-Acetaminophen-Caffeine]     Midrina; broke out in rash   . Povidone Iodine Rash    History of Present Illness    Regina Dunlap is a 59 y.o. female with a hx of HTN, HLD, SOB, varicose veins, OSA on CPAP. She was last seen by Dr. Fletcher Anon 06/30/17 for chest pain.  Initial eval 2015 for exertional dyspnea. Cardiac workup at that time with normal echo and treadmill stress test. Her workup in 2018 was for chest pain that was described as aching 2 months prior to her visit. Her pain was likely musculoskeletal and she was not recommended for further testing.   Follows with Dr. Lucky Cowboy of [facility]. She was last seen 04/2019 and recommended for laser ablation of left great saphenous vein.   Seen by her PCP yesterday. She had labs notable for K 3.0, normal renal function, normal kidney function, Hb 6.2. Lipid profile total cholesterol 158, HDL 51, LDL 84, triglycerides 135. Vitamin D low at 17. A1c  6.2, TSH 2.03. CXR 10/15/19 was without any acute cardiopulmonary processes.   Labs had not yet been reviewed with her.  Tells me she had been out of her potassium for at least a month and she was provided a new prescription her appointment yesterday.  We discussed her low vitamin D level could be contributory to fatigue.  Told her to discuss with her PCP  Her primary concern is dyspnea on exertion.  Her daughter lives on the fourth floor and tells me that when she goes up this 54 step she has to stop three quarters of the way.  She rest for 2 minutes and then is able to complete the rest of the stairs.  Tells me that this has been ongoing for at least a year and is about the same.    She exercises regularly by doing an aerobic dancing routine this about 20 minutes and walking 30 to 45 minutes outdoors.  Reports she exercises 3 to 4 days/week.  She does not get dyspneic with either of these activities.  Reports no chest pain, pressure, tightness.  Tells me she will get "twinges "in her left breast.  She had a mammogram recently that was normal.  She works at [employer].  Tells me she has not had any problems with lower extremity edema or pain since being able to wear  tennis shoes at work.  Tells me she wears good tennis shoes and rotates them regularly.  Has been working very hard on weight loss and eating healthy.  Tells me in the morning she blends bitter melon, super green mix, blackberry, blueberry, kale, and orange juice.  EKGs/Labs/Other Studies Reviewed:   The following studies were reviewed today: EKG:  EKG is ordered today.  The ekg ordered today demonstrates sinus rhythm 66 bpm with nonspecific T wave changes.  T wave inversion noted in lead III.  No acute ST/T wave changes.  Recent Labs: 12/07/2018: Magnesium 2.2 10/15/2019: ALT 18; BUN 23; Creat 1.02; Hemoglobin 12.8; Platelets 244; Potassium 3.0; Sodium 140; TSH 2.03  Recent Lipid Panel    Component Value Date/Time   CHOL 158 10/15/2019  0924   CHOL 181 10/13/2015 0911   TRIG 135 10/15/2019 0924   HDL 51 10/15/2019 0924   HDL 54 10/13/2015 0911   CHOLHDL 3.1 10/15/2019 0924   VLDL 38 (H) 04/24/2017 1528   LDLCALC 84 10/15/2019 0924    Home Medications   Current Meds  Medication Sig  . albuterol (VENTOLIN HFA) 108 (90 Base) MCG/ACT inhaler Inhale 2 puffs into the lungs every 6 (six) hours as needed for wheezing or shortness of breath.  Marland Kitchen aspirin 81 MG tablet Take 81 mg by mouth daily.  . chlorthalidone (HYGROTON) 25 MG tablet TAKE 1 TABLET(25 MG) BY MOUTH DAILY  . famotidine (PEPCID) 20 MG tablet TAKE 1 TABLET BY MOUTH TWICE DAILY AS NEEDED FOR HEARTBURN OR INDIGESTION  . fluticasone (FLONASE) 50 MCG/ACT nasal spray Place 2 sprays into both nostrils daily.  Javier Docker Oil 1000 MG CAPS Take 1 capsule daily by mouth.   . loratadine (CLARITIN) 10 MG tablet Take 1 tablet (10 mg total) by mouth daily as needed for allergies.  Marland Kitchen losartan (COZAAR) 100 MG tablet TAKE 1 TABLET(100 MG) BY MOUTH DAILY  . Magnesium 100 MG CAPS Take 1,000 mg by mouth.  . Multiple Minerals-Vitamins (CALCIUM-MAGNESIUM-ZINC-D3) TABS Take 2 tablets by mouth daily.  . potassium chloride (KLOR-CON) 10 MEQ tablet Take 1 tablet twice a week  . simvastatin (ZOCOR) 40 MG tablet Take 1 tablet (40 mg total) by mouth at bedtime.  . TURMERIC PO Take by mouth daily. Reported on 03/28/2016      Review of Systems      Review of Systems  Constitution: Positive for malaise/fatigue. Negative for chills and fever.  Cardiovascular: Positive for dyspnea on exertion. Negative for chest pain, leg swelling, near-syncope, orthopnea, palpitations and syncope.  Respiratory: Negative for cough, shortness of breath and wheezing.   Gastrointestinal: Negative for nausea and vomiting.  Neurological: Negative for dizziness, light-headedness and weakness.   All other systems reviewed and are otherwise negative except as noted above.  Physical Exam    VS:  BP 114/78 (BP  Location: Left Arm, Patient Position: Sitting, Cuff Size: Normal)   Pulse 66   Ht 5\' 2"  (1.575 m)   Wt 174 lb 4 oz (79 kg)   SpO2 98%   BMI 31.87 kg/m  , BMI Body mass index is 31.87 kg/m. GEN: Well nourished, overweight, well developed, in no acute distress. HEENT: normal. Neck: Supple, no JVD, carotid bruits, or masses. Cardiac: RRR, no murmurs, rubs, or gallops. No clubbing, cyanosis, edema.  Radials/PT 2+ and equal bilaterally.  Respiratory:  Respirations regular and unlabored, clear to auscultation bilaterally. GI: Soft, nontender, nondistended, BS + x 4. MS: No deformity or atrophy. Skin: Warm and dry, no  rash. Varicose veins bilateral LE. Neuro:  Strength and sensation are intact. Psych: Normal affect.  Accessory Clinical Findings    ECG personally reviewed by me today -sinus from 60 bpm with nonspecific T wave changes, T wave inversion noted in lead III.  No acute ST/T wave changes.- no acute changes.  Assessment & Plan    1. DOE - Present over the last year.  Notices she is more dyspneic with activity despite having lost at least 10 pounds.  Etiology deconditioning versus CAD.  Last ischemic evaluation was normal in 2015. She is very concerned regarding her heart health. Due to time since last ischemic evaluation and present symptoms - plan for Lexiscan Myoview to rule out coronary artery disease and for evaluation of LVEF.  2. HTN - Follows with her PCP.  Blood pressure well controlled.  Continue antihypertensive regimen of losartan 100 mg and chlorthalidone 25 mg daily.  She is hopeful that if she continues to lose weight that she will be able to reduce her antihypertensive regimen.  3. HLD -lipid panel yesterday with LDL 84.  Continue simvastatin 40 mg daily.  4. Hypokalemia -noted on labs yesterday.  Tells me she was out of her potassium.  She will resume her potassium as prescribed by her PCP.  5. OSA-compliant with CPAP.  Disposition: Follow up in 1 month(s) with Dr.  Fletcher Anon or APP after [facility]ton Adam.  Loel Dubonnet, NP 10/16/2019, 8:12 AM

## 2019-10-16 NOTE — Patient Instructions (Addendum)
Medication Instructions:  No medication changes today.  *If you need a refill on your cardiac medications before your next appointment, please call your pharmacy*  Lab Work: None today.   Testing/Procedures: You had an EKG today. It showed sinus rhythm. This is a good result!  Your physician has requested that you have a lexiscan myoview. For further information please visit [email or web address]. Please follow instruction sheet, as given.  [place]  Your caregiver has ordered a Stress Test with nuclear imaging. The purpose of this test is to evaluate the blood supply to your heart muscle. This procedure is referred to as a "Non-Invasive Stress Test." This is because other than having an IV started in your vein, nothing is inserted or "invades" your body. Cardiac stress tests are done to find areas of poor blood flow to the heart by determining the extent of coronary artery disease (CAD). Some patients exercise on a treadmill, which naturally increases the blood flow to your heart, while others who are  unable to walk on a treadmill due to physical limitations have a pharmacologic/chemical stress agent called Lexiscan . This medicine will mimic walking on a treadmill by temporarily increasing your coronary blood flow.   Please note: these test may take anywhere between 2-4 hours to complete  PLEASE REPORT TO [facility] AT THE FIRST DESK WILL DIRECT YOU WHERE TO GO  Date of Procedure:_____________________________________  Arrival Time for Procedure:______________________________  Instructions regarding medication:   You may take all of your regular medications the morning of your test with a sip of water.  PLEASE NOTIFY THE OFFICE AT LEAST 58 HOURS IN ADVANCE IF YOU ARE UNABLE TO KEEP YOUR APPOINTMENT.  [phone] AND  PLEASE NOTIFY NUCLEAR MEDICINE AT [facility] AT LEAST 24 HOURS IN ADVANCE IF YOU ARE UNABLE TO KEEP YOUR APPOINTMENT. [phone]  How  to prepare for your Myoview test:  1. Do not eat or drink after midnight 2. No caffeine for 24 hours prior to test 3. No smoking 24 hours prior to test. 4. Your medication may be taken with water.  If your doctor stopped a medication because of this test, do not take that medication. 5. Ladies, please do not wear dresses.  Skirts or pants are appropriate. Please wear a short sleeve shirt. 6. No perfume, cologne or lotion. 7. Wear comfortable walking shoes. No heels!            Follow-Up: At [facility], you and your health needs are our priority.  As part of our continuing mission to provide you with exceptional heart care, we have created designated Provider Care Teams.  These Care Teams include your primary Cardiologist (physician) and Advanced Practice Providers (APPs -  Physician Assistants and Nurse Practitioners) who all work together to provide you with the care you need, when you need it.  Your next appointment:   1 month(s) or after your Lexiscan (stress test)  The format for your next appointment:   In Person  Provider:   You may see Kathlyn Sacramento, MD or one of the following Advanced Practice Providers on your designated Care Team:    Murray Hodgkins, NP  Christell Faith, PA-C  Marrianne Mood, PA-C  Other Instructions  NA

## 2019-10-20 DIAGNOSIS — G4733 Obstructive sleep apnea (adult) (pediatric): Secondary | ICD-10-CM | POA: Diagnosis not present

## 2019-10-23 ENCOUNTER — Encounter
Admission: RE | Admit: 2019-10-23 | Discharge: 2019-10-23 | Disposition: A | Discharge status: Home / Self Care | Payer: BC Managed Care – PPO | Source: Ambulatory Visit | Attending: Family | Admitting: Family

## 2019-10-23 ENCOUNTER — Telehealth: Payer: Self-pay

## 2019-10-23 ENCOUNTER — Other Ambulatory Visit: Payer: Self-pay

## 2019-10-23 DIAGNOSIS — R0600 Dyspnea, unspecified: Secondary | ICD-10-CM | POA: Insufficient documentation

## 2019-10-23 DIAGNOSIS — R0609 Other forms of dyspnea: Secondary | ICD-10-CM

## 2019-10-23 DIAGNOSIS — I10 Essential (primary) hypertension: Secondary | ICD-10-CM | POA: Insufficient documentation

## 2019-10-23 LAB — NM MYOCAR MULTI W/SPECT W/WALL MOTION / EF
Estimated workload: 1 METS
Exercise duration (sec): 57 s
LV dias vol: 63 mL (ref 46–106)
LV sys vol: 25 mL
Peak HR: 98 {beats}/min
Percent HR: 59 %
Rest HR: 57 {beats}/min
SDS: 3
SRS: 1
SSS: 0
TID: 1

## 2019-10-23 MED ORDER — TECHNETIUM TC 99M TETROFOSMIN IV KIT
10.4800 | PACK | Freq: Once | INTRAVENOUS | Status: AC | PRN
  Administered 2019-10-23: 10.48 via INTRAVENOUS

## 2019-10-23 MED ORDER — TECHNETIUM TC 99M TETROFOSMIN IV KIT
29.4300 | PACK | Freq: Once | INTRAVENOUS | Status: AC | PRN
  Administered 2019-10-23: 29.43 via INTRAVENOUS

## 2019-10-23 MED ORDER — REGADENOSON 0.4 MG/5ML IV SOLN
0.4000 mg | Freq: Once | INTRAVENOUS | Status: AC
  Administered 2019-10-23: 0.4 mg via INTRAVENOUS

## 2019-10-23 NOTE — Telephone Encounter (Signed)
-----   Message from Loel Dubonnet, NP sent at 10/23/2019  1:09 PM EST ----- Stress test with no evidence of ischemia. Normal heart pumping function. Good result.

## 2019-10-23 NOTE — Telephone Encounter (Signed)
Patient made aware of stress test results with verbalized understanding. 

## 2019-11-11 ENCOUNTER — Encounter: Payer: Self-pay | Admitting: Physician Assistant

## 2019-11-11 NOTE — Progress Notes (Signed)
Cardiology Office Note    Date:  11/19/2019   ID:  Regina Dunlap August 13, 1961, MRN [id number]  PCP:  Hubbard Hartshorn, FNP  Cardiologist:  Kathlyn Sacramento, MD  Electrophysiologist:  None   Chief Complaint: Follow up  History of Present Illness:   Regina Dunlap is a 59 y.o. female with history of HTN, HLD, hepatic steatosis, varicose veins, obesity, sleep apnea on CPAP, SOB, GERD who presents for follow up of recent MPI.   She was evaluated in 2015 for exertional dyspnea.  At that time, echo showed an EF of 60 to 65%, normal wall motion, normal LV diastolic function, normal RV systolic function, normal PASP, and no significant valvular abnormalities.  She underwent treadmill stress test which was normal.  Her symptoms were felt to be noncardiac.  She was most recently seen by our office in 09/2019 with continued dyspnea on exertion and fatigue which was unchanged over the past 12 months.  She reported having to stop and rest when climbing steps to where her daughter lives (on the 4th floor).  She noted she was able to regularly perform aerobic dance exercise for about 20 minutes and walking outdoors for 30-45 minutes 3 to 4 days per week without dyspnea. Recent chest x-ray performed by PCP at that time was reviewed which showed no acute cardiopulmonary process.  She was noted to be hypokalemic and have a low vitamin D level on recent labs through PCP's office.  In this setting, she underwent Lexiscan MPI on 10/23/2019 which showed no significant ischemia with an EF of 69%.  Overall, this was a low risk study.  She comes in doing well.  She has not had any further dyspnea though she has not visited her daughter since she was last seen and had to climb the 4 flights of stairs.  She does continue to exercise on a regular basis and denies any dyspnea with this.  No chest pain, palpitations, dizziness, presyncope, syncope.  She has noted some occasional wheezing and was prescribed an  inhaler for this though she has not had to use it.  No lower extremity swelling, abdominal distention, orthopnea, PND, or early satiety.  She was never a smoker.  Her mother and father smoked throughout her childhood though went outdoors.  Her husband previously smoked though quit approximately 10 to 15 years prior.  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  She is compliant with her CPAP though has not had follow-up with this for many years.  Blood pressure typically runs in the 120s over 70s to 80s.  Tolerating all medications without issues.   Labs independently reviewed: 09/2019 - TSH normal, Hgb 12.8, PLT 244, BUN 23, creatinine 1.02, potassium 3.0, albumin 4.1, AST/ALT normal, TC 158, TG 135, HDL 51, LDL 84, A1c 6.2  Past Medical History:  Diagnosis Date  . Esophageal reflux   . Fatty liver 11/04/2016  . Mastodynia   . Other and unspecified hyperlipidemia   . Overweight (BMI 25.0-29.9) 11/04/2016  . Pain in thoracic spine   . Prediabetes 03/28/2016  . Unspecified essential hypertension   . Unspecified sleep apnea     Past Surgical History:  Procedure Laterality Date  . BREAST BIOPSY Right 1999   neg  . BREAST CYST ASPIRATION Right    neg  . TUBAL LIGATION      Current Medications: Current Meds  Medication Sig  . albuterol (VENTOLIN HFA) 108 (  90 Base) MCG/ACT inhaler Inhale 2 puffs into the lungs every 6 (six) hours as needed for wheezing or shortness of breath.  . Ascorbic Acid (VITAMIN C) 100 MG tablet Take 500 mg by mouth daily.  Marland Kitchen aspirin 81 MG tablet Take 81 mg by mouth daily.  . chlorthalidone (HYGROTON) 25 MG tablet TAKE 1 TABLET(25 MG) BY MOUTH DAILY  . cholecalciferol (VITAMIN D3) 25 MCG (1000 UNIT) tablet Take 5,000 Units by mouth daily.  . famotidine (PEPCID) 20 MG tablet TAKE 1 TABLET BY MOUTH TWICE DAILY AS NEEDED FOR HEARTBURN OR INDIGESTION  . fluticasone (FLONASE) 50 MCG/ACT nasal spray Place 2 sprays  into both nostrils daily.  Javier Docker Oil 1000 MG CAPS Take 1 capsule daily by mouth.   . loratadine (CLARITIN) 10 MG tablet Take 1 tablet (10 mg total) by mouth daily as needed for allergies.  Marland Kitchen losartan (COZAAR) 100 MG tablet TAKE 1 TABLET(100 MG) BY MOUTH DAILY  . Magnesium 100 MG CAPS Take 1,000 mg by mouth.  . Multiple Minerals-Vitamins (CALCIUM-MAGNESIUM-ZINC-D3) TABS Take 2 tablets by mouth daily.  . potassium chloride (KLOR-CON) 10 MEQ tablet Take 1 tablet twice a week  . simvastatin (ZOCOR) 40 MG tablet Take 1 tablet (40 mg total) by mouth at bedtime.  . TURMERIC PO Take by mouth daily. Reported on 03/28/2016    Allergies:   Ciprofloxacin, Lisinopril, Migraine formula [aspirin-acetaminophen-caffeine], and Povidone iodine   Social History   Socioeconomic History  . Marital status: Married    Spouse name: Not on file  . Number of children: Not on file  . Years of education: Not on file  . Highest education level: Not on file  Occupational History  . Not on file  Tobacco Use  . Smoking status: Never Smoker  . Smokeless tobacco: Never Used  Substance and Sexual Activity  . Alcohol use: Yes    Comment: social  . Drug use: No  . Sexual activity: Yes  Other Topics Concern  . Not on file  Social History Narrative  . Not on file   Social Determinants of Health   Financial Resource Strain:   . Difficulty of Paying Living Expenses: Not on file  Food Insecurity:   . Worried About Charity fundraiser in the Last Year: Not on file  . Ran Out of Food in the Last Year: Not on file  Transportation Needs:   . Lack of Transportation (Medical): Not on file  . Lack of Transportation (Non-Medical): Not on file  Physical Activity:   . Days of Exercise per Week: Not on file  . Minutes of Exercise per Session: Not on file  Stress:   . Feeling of Stress : Not on file  Social Connections:   . Frequency of Communication with Friends and Family: Not on file  . Frequency of Social  Gatherings with Friends and Family: Not on file  . Attends Religious Services: Not on file  . Active Member of Clubs or Organizations: Not on file  . Attends Archivist Meetings: Not on file  . Marital Status: Not on file     Family History:  The patient's family history includes Anuerysm in her brother; Cancer in her sister and sister; Hypertension in her father, sister, and sister; Migraines in her mother; Stroke in her father. There is no history of Breast cancer.  ROS:   Review of Systems  Constitutional: Negative for chills, diaphoresis, fever, malaise/fatigue and weight loss.  HENT: Negative for congestion.  Eyes: Negative for discharge and redness.  Respiratory: Positive for shortness of breath. Negative for cough, sputum production and wheezing.   Cardiovascular: Negative for chest pain, palpitations, orthopnea, claudication, leg swelling and PND.  Gastrointestinal: Negative for abdominal pain, heartburn, nausea and vomiting.  Musculoskeletal: Negative for falls and myalgias.  Skin: Negative for rash.  Neurological: Negative for dizziness, tingling, tremors, sensory change, speech change, focal weakness, loss of consciousness and weakness.  Endo/Heme/Allergies: Does not bruise/bleed easily.  Psychiatric/Behavioral: Negative for substance abuse. The patient is not nervous/anxious.   All other systems reviewed and are negative.    EKGs/Labs/Other Studies Reviewed:    Studies reviewed were summarized above. The additional studies were reviewed today:  Lexiscan MPI 09/2019:  There was no ST segment deviation noted during stress.  No T wave inversion was noted during stress.  The study is normal.  This is a low risk study.  The left ventricular ejection fraction is normal (69%).  No evidence for ischemia __________  2D echo 05/2014: - Left ventricle: The cavity size was normal. Systolic function was  normal. The estimated ejection fraction was in the  range of 60%  to 65%. Wall motion was normal; there were no regional wall  motion abnormalities. Left ventricular diastolic function  parameters were normal.  - Left atrium: The atrium was normal in size.  - Right ventricle: Systolic function was normal.  - Pulmonary arteries: Systolic pressure was within the normal  range.   Impressions:  - Normal study.     EKG:  EKG is ordered today.  The EKG ordered today demonstrates NSR, 76 bpm, normal axis, low voltage QRS, no acute ST-T changes  Recent Labs: 12/07/2018: Magnesium 2.2 10/15/2019: ALT 18; BUN 23; Creat 1.02; Hemoglobin 12.8; Platelets 244; Potassium 3.0; Sodium 140; TSH 2.03  Recent Lipid Panel    Component Value Date/Time   CHOL 158 10/15/2019 0924   CHOL 181 10/13/2015 0911   TRIG 135 10/15/2019 0924   HDL 51 10/15/2019 0924   HDL 54 10/13/2015 0911   CHOLHDL 3.1 10/15/2019 0924   VLDL 38 (H) 04/24/2017 1528   LDLCALC 84 10/15/2019 0924    PHYSICAL EXAM:    VS:  BP 120/84 (BP Location: Left Arm, Patient Position: Sitting, Cuff Size: Normal)   Pulse 76   Ht 5\' 2"  (1.575 m)   Wt 180 lb 8 oz (81.9 kg)   SpO2 98%   BMI 33.01 kg/m   BMI: Body mass index is 33.01 kg/m.  Physical Exam  Constitutional: She is oriented to person, place, and time. She appears well-developed and well-nourished.  HENT:  Head: Normocephalic and atraumatic.  Eyes: Right eye exhibits no discharge. Left eye exhibits no discharge.  Neck: No JVD present.  Cardiovascular: Normal rate, regular rhythm, S1 normal, S2 normal and normal heart sounds. Exam reveals no distant heart sounds, no friction rub, no midsystolic click and no opening snap.  No murmur heard. Pulses:      Posterior tibial pulses are 2+ on the right side and 2+ on the left side.  Pulmonary/Chest: Effort normal and breath sounds normal. No respiratory distress. She has no decreased breath sounds. She has no wheezes. She has no rales. She exhibits no tenderness.    Abdominal: Soft. She exhibits no distension. There is no abdominal tenderness.  Musculoskeletal:        General: No edema.     Cervical back: Normal range of motion.  Neurological: She is alert and oriented to person, place,  and time.  Skin: Skin is warm and dry. No cyanosis. Nails show no clubbing.  Psychiatric: She has a normal mood and affect. Her speech is normal and behavior is normal. Judgment and thought content normal.    Wt Readings from Last 3 Encounters:  11/19/19 180 lb 8 oz (81.9 kg)  10/16/19 174 lb 4 oz (79 kg)  10/15/19 170 lb 14.4 oz (77.5 kg)     ASSESSMENT & PLAN:   1. Dyspnea on exertion: She notes an approximate 72-month history of dyspnea, only when ambulating up stairs.  Her current symptoms feel exactly like she did in 2015 when she was evaluated with symptoms felt to be noncardiac.  At that time, she was having to ambulate up 9 flights of stairs to her daughter's apartment.  Recent Lexiscan MPI without significant ischemia and with preserved LVSF.  This was overall a low risk study.  Check echo.  If this is unrevealing consider pulmonology referral given reported wheezing which will be deferred to PCP.  She continues to exercise on a regular basis without cardiac limitation.  Cannot exclude some degree of obesity and physical deconditioning playing a role in her presentation.  2. HTN: Blood pressure is well controlled.  Continue current therapy including chlorthalidone and losartan.  3. HLD: LDL of 84 from 09/2019 with normal liver function at that time.  Remains on simvastatin.  Followed by PCP.  4. Hypokalemia: In the setting of running out of potassium supplementation.  She is now back on this.  If needed, from a hypertensive perspective, could consider discontinuing KCl and placing patient on spironolactone in the future.  5. OSA: Compliant with CPAP.  Refer to Dr. Radford Pax for follow-up of CPAP as this has not been monitored in several years.  Disposition: F/u  with Dr. Fletcher Anon or an APP in 6 months.   Medication Adjustments/Labs and Tests Ordered: Current medicines are reviewed at length with the patient today.  Concerns regarding medicines are outlined above. Medication changes, Labs and Tests ordered today are summarized above and listed in the Patient Instructions accessible in Encounters.   Signed, Christell Faith, PA-C 11/19/2019 9:09 AM     [facility] [address] [place] [address] [place], [place] [address] [phone]

## 2019-11-19 ENCOUNTER — Encounter: Payer: Self-pay | Admitting: Physician Assistant

## 2019-11-19 ENCOUNTER — Ambulatory Visit (INDEPENDENT_AMBULATORY_CARE_PROVIDER_SITE_OTHER): Payer: BC Managed Care – PPO | Admitting: Physician Assistant

## 2019-11-19 ENCOUNTER — Other Ambulatory Visit: Payer: Self-pay

## 2019-11-19 VITALS — BP 120/84 | HR 76 | Ht 62.0 in | Wt 180.5 lb

## 2019-11-19 DIAGNOSIS — E876 Hypokalemia: Secondary | ICD-10-CM | POA: Diagnosis not present

## 2019-11-19 DIAGNOSIS — I10 Essential (primary) hypertension: Secondary | ICD-10-CM | POA: Diagnosis not present

## 2019-11-19 DIAGNOSIS — E782 Mixed hyperlipidemia: Secondary | ICD-10-CM

## 2019-11-19 DIAGNOSIS — R0600 Dyspnea, unspecified: Secondary | ICD-10-CM | POA: Diagnosis not present

## 2019-11-19 DIAGNOSIS — R0609 Other forms of dyspnea: Secondary | ICD-10-CM

## 2019-11-19 DIAGNOSIS — G4733 Obstructive sleep apnea (adult) (pediatric): Secondary | ICD-10-CM

## 2019-11-19 NOTE — Patient Instructions (Addendum)
Medication Instructions:  Your physician recommends that you continue on your current medications as directed. Please refer to the Current Medication list given to you today.  *If you need a refill on your cardiac medications before your next appointment, please call your pharmacy*  Lab Work: None ordered  If you have labs (blood work) drawn today and your tests are completely normal, you will receive your results only by: Marland Kitchen MyChart Message (if you have MyChart) OR . A paper copy in the mail If you have any lab test that is abnormal or we need to change your treatment, we will call you to review the results.  Testing/Procedures: 1- Echo  Please return to [facility] on ______________ at _______________ AM/PM for an Echocardiogram. Your physician has requested that you have an echocardiogram. Echocardiography is a painless test that uses sound waves to create images of your heart. It provides your doctor with information about the size and shape of your heart and how well your heart's chambers and valves are working. This procedure takes approximately one hour. There are no restrictions for this procedure. Please note; depending on visual quality an IV may need to be placed.    2- Ref to Cardiology Dr. Radford Pax in [place]  Follow-Up: At [facility], you and your health needs are our priority.  As part of our continuing mission to provide you with exceptional heart care, we have created designated Provider Care Teams.  These Care Teams include your primary Cardiologist (physician) and Advanced Practice Providers (APPs -  Physician Assistants and Nurse Practitioners) who all work together to provide you with the care you need, when you need it.  Your next appointment:   6 month(s)  The format for your next appointment:   In Person  Provider:    You may see Kathlyn Sacramento, MD or Christell Faith, PA-C.

## 2019-11-20 DIAGNOSIS — G4733 Obstructive sleep apnea (adult) (pediatric): Secondary | ICD-10-CM | POA: Diagnosis not present

## 2019-11-26 ENCOUNTER — Ambulatory Visit (INDEPENDENT_AMBULATORY_CARE_PROVIDER_SITE_OTHER): Payer: BC Managed Care – PPO

## 2019-11-26 DIAGNOSIS — Z23 Encounter for immunization: Secondary | ICD-10-CM | POA: Diagnosis not present

## 2019-11-27 ENCOUNTER — Ambulatory Visit: Payer: BC Managed Care – PPO

## 2019-11-28 ENCOUNTER — Telehealth: Payer: Self-pay | Admitting: *Deleted

## 2019-11-28 NOTE — Progress Notes (Signed)
Virtual Visit via TElephone  Note   This visit type was conducted due to national recommendations for restrictions regarding the COVID-19 Pandemic (e.g. social distancing) in an effort to limit this patient's exposure and mitigate transmission in our community.  Due to her co-morbid illnesses, this patient is at least at moderate risk for complications without adequate follow up.  This format is felt to be most appropriate for this patient at this time.  All issues noted in this document were discussed and addressed.  A limited physical exam was performed with this format.  Please refer to the patient's chart for her consent to telehealth for [facility].   Evaluation Performed:  New sleep referral  This visit type was conducted due to national recommendations for restrictions regarding the COVID-19 Pandemic (e.g. social distancing).  This format is felt to be most appropriate for this patient at this time.  All issues noted in this document were discussed and addressed.  No physical exam was performed (except for noted visual exam findings with Video Visits).  Please refer to the patient's chart (MyChart message for video visits and phone note for telephone visits) for the patient's consent to telehealth for [facility].  Date:  11/29/2019   ID:  Regina, Dunlap 1961-07-19, MRN [id number]  Patient Location:  Home  Provider location:   [place]  PCP:  None Cardiologist:  Kathlyn Sacramento, MD  Sleep medicine:  Fransico Him, MD Electrophysiologist:  None   Chief Complaint:  OSA  History of Present Illness:    Regina Dunlap is a 59 y.o. female who presents via audio/video conferencing for a telehealth visit today.    Regina Dunlap is a 59 y.o. female with history of HTN, HLD, hepatic steatosis, varicose veins, obesity, sleep apnea on CPAP, SOB, GERD.  She is referred today to establish care with a Sleep medicine MD.  She is compliant with her CPAP but has  not had followup in many years.  She is doing well with her CPAP device and thinks that she has gotten used to it.  She tolerates the nasal mask and feels the pressure is adequate.  She says that when she turns over in bed the mask moves and will leak sometimes.  Since going on CPAP she feels rested in the am and has no significant daytime sleepiness.  She denies any significant mouth but occasionally she will have a stuffy nose if she does not use her nasal steroid spray.   The patient does not have symptoms concerning for COVID-19 infection (fever, chills, cough, or new shortness of breath).   Prior CV studies:   The following studies were reviewed today:  PAP compliance download  Past Medical History:  Diagnosis Date  . Esophageal reflux   . Fatty liver 11/04/2016  . Mastodynia   . Other and unspecified hyperlipidemia   . Overweight (BMI 25.0-29.9) 11/04/2016  . Pain in thoracic spine   . Prediabetes 03/28/2016  . Unspecified essential hypertension   . Unspecified sleep apnea    Past Surgical History:  Procedure Laterality Date  . BREAST BIOPSY Right 1999   neg  . BREAST CYST ASPIRATION Right    neg  . TUBAL LIGATION       Current Meds  Medication Sig  . albuterol (VENTOLIN HFA) 108 (90 Base) MCG/ACT inhaler Inhale 2 puffs into the lungs every 6 (six) hours as needed for wheezing or shortness of breath.  . Ascorbic Acid (VITAMIN C) 100 MG  tablet Take 500 mg by mouth daily.  Marland Kitchen aspirin 81 MG tablet Take 81 mg by mouth daily.  . chlorthalidone (HYGROTON) 25 MG tablet TAKE 1 TABLET(25 MG) BY MOUTH DAILY  . cholecalciferol (VITAMIN D3) 25 MCG (1000 UNIT) tablet Take 5,000 Units by mouth daily.  . famotidine (PEPCID) 20 MG tablet TAKE 1 TABLET BY MOUTH TWICE DAILY AS NEEDED FOR HEARTBURN OR INDIGESTION  . fluticasone (FLONASE) 50 MCG/ACT nasal spray Place 2 sprays into both nostrils daily. (Patient taking differently: Place 2 sprays into both nostrils daily as needed. )  . Krill Oil  1000 MG CAPS Take 1 capsule daily by mouth.   . loratadine (CLARITIN) 10 MG tablet Take 1 tablet (10 mg total) by mouth daily as needed for allergies.  Marland Kitchen losartan (COZAAR) 100 MG tablet TAKE 1 TABLET(100 MG) BY MOUTH DAILY  . Magnesium 100 MG CAPS Take 1,000 mg by mouth.  . Multiple Minerals-Vitamins (CALCIUM-MAGNESIUM-ZINC-D3) TABS Take 2 tablets by mouth daily.  . potassium chloride (KLOR-CON) 10 MEQ tablet Take 1 tablet twice a week  . simvastatin (ZOCOR) 40 MG tablet Take 1 tablet (40 mg total) by mouth at bedtime.  . TURMERIC PO Take by mouth daily. Reported on 03/28/2016     Allergies:   Ciprofloxacin, Lisinopril, Migraine formula [aspirin-acetaminophen-caffeine], and Povidone iodine   Social History   Tobacco Use  . Smoking status: Never Smoker  . Smokeless tobacco: Never Used  Substance Use Topics  . Alcohol use: Yes    Comment: social  . Drug use: No     Family Hx: The patient's family history includes Anuerysm in her brother; Cancer in her sister and sister; Hypertension in her father, sister, and sister; Migraines in her mother; Stroke in her father. There is no history of Breast cancer.  ROS:   Please see the history of present illness.     All other systems reviewed and are negative.   Labs/Other Tests and Data Reviewed:    Recent Labs: 12/07/2018: Magnesium 2.2 10/15/2019: ALT 18; BUN 23; Creat 1.02; Hemoglobin 12.8; Platelets 244; Potassium 3.0; Sodium 140; TSH 2.03   Recent Lipid Panel Lab Results  Component Value Date/Time   CHOL 158 10/15/2019 09:24 AM   CHOL 181 10/13/2015 09:11 AM   TRIG 135 10/15/2019 09:24 AM   HDL 51 10/15/2019 09:24 AM   HDL 54 10/13/2015 09:11 AM   CHOLHDL 3.1 10/15/2019 09:24 AM   LDLCALC 84 10/15/2019 09:24 AM    Wt Readings from Last 3 Encounters:  11/29/19 172 lb (78 kg)  11/19/19 180 lb 8 oz (81.9 kg)  10/16/19 174 lb 4 oz (79 kg)     Objective:    Vital Signs:  Ht 5\' 2"  (1.575 m)   Wt 172 lb (78 kg)   BMI 31.46  kg/m     ASSESSMENT & PLAN:    1.  OSA -  The patient has been using and benefiting from PAP use and will continue to benefit from therapy. Her device is more than 57 years old so we will get her a new device.  She uses a DME called "[facility]" in [place].  I will order a ResMed CPAP on auto from 4 to 18cm H2O with heated humidity and get a download 2 weeks later.    2.  HTN -BP controlled  -continue chlorthalidone and losartan  3.  Obesity -I have encouraged her to get into a routine exercise program and cut back on carbs and portions.  COVID-19 Education: The signs and symptoms of COVID-19 were discussed with the patient and how to seek care for testing (follow up with PCP or arrange E-visit).  The importance of social distancing was discussed today.  Patient Risk:   After full review of this patient's clinical status, I feel that they are at least moderate risk at this time.  Time:   Today, I have spent 20 minutes on telemedicine discussing medical problems including OSA< HTN, Obesity and reviewing patient's chart including OV notes from [facility].  Medication Adjustments/Labs and Tests Ordered: Current medicines are reviewed at length with the patient today.  Concerns regarding medicines are outlined above.  Tests Ordered: No orders of the defined types were placed in this encounter.  Medication Changes: No orders of the defined types were placed in this encounter.   Disposition:  Follow up in 8 week(s) after she has gotten her new device  Signed, Fransico Him, MD  11/29/2019 10:25 AM    [place]

## 2019-11-28 NOTE — Telephone Encounter (Signed)

## 2019-11-29 ENCOUNTER — Other Ambulatory Visit: Payer: Self-pay | Admitting: Family Medicine

## 2019-11-29 ENCOUNTER — Encounter: Payer: Self-pay | Admitting: Cardiology

## 2019-11-29 ENCOUNTER — Other Ambulatory Visit: Payer: Self-pay

## 2019-11-29 ENCOUNTER — Telehealth (INDEPENDENT_AMBULATORY_CARE_PROVIDER_SITE_OTHER): Payer: BC Managed Care – PPO | Admitting: Cardiology

## 2019-11-29 ENCOUNTER — Telehealth: Payer: Self-pay | Admitting: *Deleted

## 2019-11-29 VITALS — Ht 62.0 in | Wt 172.0 lb

## 2019-11-29 DIAGNOSIS — I10 Essential (primary) hypertension: Secondary | ICD-10-CM | POA: Diagnosis not present

## 2019-11-29 DIAGNOSIS — G4733 Obstructive sleep apnea (adult) (pediatric): Secondary | ICD-10-CM

## 2019-11-29 DIAGNOSIS — E669 Obesity, unspecified: Secondary | ICD-10-CM | POA: Diagnosis not present

## 2019-11-29 DIAGNOSIS — Z6831 Body mass index (BMI) 31.0-31.9, adult: Secondary | ICD-10-CM | POA: Diagnosis not present

## 2019-11-29 NOTE — Telephone Encounter (Signed)
Order placed to [facility].

## 2019-11-29 NOTE — Telephone Encounter (Signed)
-----   Message from Sueanne Margarita, MD sent at 11/29/2019 10:25 AM EST ----- Please order  a ResMed CPAP on auto from 4 to 18cm H2O with heated humidity.  Please get a download from DME in 2 weeks.  She will need a followup with me 8 weeks after getting her new device.  Get a copy of her records at "[facility]" in [place].

## 2019-12-05 ENCOUNTER — Other Ambulatory Visit: Payer: Self-pay | Admitting: Family Medicine

## 2019-12-10 ENCOUNTER — Other Ambulatory Visit: Payer: Self-pay | Admitting: Family Medicine

## 2019-12-10 NOTE — Telephone Encounter (Signed)
Requested Prescriptions  Pending Prescriptions Disp Refills  . famotidine (PEPCID) 20 MG tablet [Pharmacy Med Name: FAMOTIDINE 20MG  TABLETS] 90 tablet 1    Sig: TAKE 1 TABLET BY MOUTH TWICE DAILY AS NEEDED FOR HEARTBURN OR INDIGESTION     Gastroenterology:  H2 Antagonists Passed - 12/10/2019  3:46 AM      Passed - Valid encounter within last 12 months    Recent Outpatient Visits          1 month ago Hypertension goal BP (blood pressure) < 140/90   [place], [place], FNP   4 months ago Hypertension goal BP (blood pressure) < 140/90   [place], [place], [place]   10 months ago Hypertension goal BP (blood pressure) < 140/90   [place], NP   1 year ago Bilateral calf pain   [place], NP   1 year ago Foot pain, left   [place], Satira Anis, MD      Future Appointments            In 4 months Uvaldo Rising, Astrid Divine, [facility], [place]   In 5 months Fletcher Anon, Mertie Clause, MD [facility], [place]

## 2019-12-18 DIAGNOSIS — G4733 Obstructive sleep apnea (adult) (pediatric): Secondary | ICD-10-CM | POA: Diagnosis not present

## 2019-12-20 ENCOUNTER — Other Ambulatory Visit: Payer: Self-pay | Admitting: Family Medicine

## 2019-12-20 ENCOUNTER — Other Ambulatory Visit: Payer: Self-pay

## 2019-12-20 ENCOUNTER — Ambulatory Visit (INDEPENDENT_AMBULATORY_CARE_PROVIDER_SITE_OTHER): Payer: BC Managed Care – PPO

## 2019-12-20 DIAGNOSIS — R0600 Dyspnea, unspecified: Secondary | ICD-10-CM | POA: Diagnosis not present

## 2019-12-20 DIAGNOSIS — R0609 Other forms of dyspnea: Secondary | ICD-10-CM

## 2019-12-23 ENCOUNTER — Telehealth: Payer: Self-pay

## 2019-12-23 NOTE — Telephone Encounter (Signed)
-----   Message from Rise Mu, PA-C sent at 12/23/2019  7:40 AM EDT ----- Echo showed normal pump function with a slightly stiffened heart without any significant valvular abnormalities.   Recommendations: -Optimal BP and HR control, both were well controlled at her last visit -Symptoms are noncardiac in etiology  -Follow up with PCP if dyspnea persists, they can consider referral to pulmonology

## 2019-12-23 NOTE — Telephone Encounter (Signed)
Spoke to patient. No further questions or orders at this time.   Advised pt to call for any further questions or concerns.  

## 2020-01-18 DIAGNOSIS — G4733 Obstructive sleep apnea (adult) (pediatric): Secondary | ICD-10-CM | POA: Diagnosis not present

## 2020-01-23 ENCOUNTER — Ambulatory Visit: Payer: BC Managed Care – PPO

## 2020-02-10 ENCOUNTER — Other Ambulatory Visit: Payer: Self-pay | Admitting: Emergency Medicine

## 2020-02-10 DIAGNOSIS — J302 Other seasonal allergic rhinitis: Secondary | ICD-10-CM

## 2020-02-11 MED ORDER — LORATADINE 10 MG PO TABS: 10.0000 mg | ORAL_TABLET | Freq: Every day | ORAL | 11 refills | Status: DC | PRN

## 2020-02-17 DIAGNOSIS — G4733 Obstructive sleep apnea (adult) (pediatric): Secondary | ICD-10-CM | POA: Diagnosis not present

## 2020-03-19 DIAGNOSIS — G4733 Obstructive sleep apnea (adult) (pediatric): Secondary | ICD-10-CM | POA: Diagnosis not present

## 2020-04-08 ENCOUNTER — Other Ambulatory Visit: Payer: Self-pay

## 2020-04-08 DIAGNOSIS — E782 Mixed hyperlipidemia: Secondary | ICD-10-CM

## 2020-04-08 MED ORDER — SIMVASTATIN 40 MG PO TABS: 40.0000 mg | ORAL_TABLET | Freq: Every day | ORAL | 0 refills | Status: DC

## 2020-04-13 ENCOUNTER — Ambulatory Visit: Payer: BC Managed Care – PPO | Admitting: Family Medicine

## 2020-04-18 DIAGNOSIS — G4733 Obstructive sleep apnea (adult) (pediatric): Secondary | ICD-10-CM | POA: Diagnosis not present

## 2020-04-24 ENCOUNTER — Ambulatory Visit: Payer: BC Managed Care – PPO | Admitting: Family Medicine

## 2020-05-01 ENCOUNTER — Ambulatory Visit (INDEPENDENT_AMBULATORY_CARE_PROVIDER_SITE_OTHER): Payer: BC Managed Care – PPO | Admitting: Family Medicine

## 2020-05-01 ENCOUNTER — Encounter: Payer: Self-pay | Admitting: Family Medicine

## 2020-05-01 ENCOUNTER — Other Ambulatory Visit: Payer: Self-pay

## 2020-05-01 VITALS — BP 120/72 | HR 81 | Temp 98.0°F | Resp 14 | Ht 62.0 in | Wt 175.9 lb

## 2020-05-01 DIAGNOSIS — Z5181 Encounter for therapeutic drug level monitoring: Secondary | ICD-10-CM

## 2020-05-01 DIAGNOSIS — K219 Gastro-esophageal reflux disease without esophagitis: Secondary | ICD-10-CM

## 2020-05-01 DIAGNOSIS — Z1231 Encounter for screening mammogram for malignant neoplasm of breast: Secondary | ICD-10-CM

## 2020-05-01 DIAGNOSIS — R7303 Prediabetes: Secondary | ICD-10-CM

## 2020-05-01 DIAGNOSIS — E782 Mixed hyperlipidemia: Secondary | ICD-10-CM

## 2020-05-01 DIAGNOSIS — K760 Fatty (change of) liver, not elsewhere classified: Secondary | ICD-10-CM | POA: Diagnosis not present

## 2020-05-01 DIAGNOSIS — E559 Vitamin D deficiency, unspecified: Secondary | ICD-10-CM | POA: Diagnosis not present

## 2020-05-01 DIAGNOSIS — E876 Hypokalemia: Secondary | ICD-10-CM

## 2020-05-01 DIAGNOSIS — E669 Obesity, unspecified: Secondary | ICD-10-CM

## 2020-05-01 DIAGNOSIS — I10 Essential (primary) hypertension: Secondary | ICD-10-CM

## 2020-05-01 DIAGNOSIS — Z6831 Body mass index (BMI) 31.0-31.9, adult: Secondary | ICD-10-CM

## 2020-05-01 DIAGNOSIS — E66811 Obesity, class 1: Secondary | ICD-10-CM

## 2020-05-01 MED ORDER — SIMVASTATIN 20 MG PO TABS: 20.0000 mg | ORAL_TABLET | Freq: Every day | ORAL | 3 refills | Status: DC

## 2020-05-01 NOTE — Patient Instructions (Signed)
Health Maintenance  Topic Date Due  . COVID-19 Vaccine (1) Never done  . PAP SMEAR-Modifier  04/24/2020  . INFLUENZA VACCINE  04/26/2020  . COLONOSCOPY  06/26/2020  . MAMMOGRAM  07/14/2020  . TETANUS/TDAP  11/04/2026  . Hepatitis C Screening  Completed  . HIV Screening  Completed

## 2020-05-01 NOTE — Progress Notes (Signed)
Name: Regina Dunlap   MRN: [id number]    DOB: July 26, 1961   Date:05/01/2020       Progress Note  Chief Complaint  Patient presents with  . Follow-up    6 months  . Hypertension  . Hyperlipidemia  . Gastroesophageal Reflux     Subjective:   Regina Dunlap is a 59 y.o. female, presents to clinic for routine follow-up as noted above  Prediabetes - advised previously to watch diet and carbs  Lab Results  Component Value Date   HGBA1C 6.2 (H) 10/15/2019   HGBA1C 6.0 (H) 02/21/2019   HGBA1C 6.0 (H) 06/22/2018   Hyperlipidemia: Currently treated with simvastain, pt reports good med compliance - on 40 mg, she asks about decreasing dose  Last Lipids: Lab Results  Component Value Date   CHOL 158 10/15/2019   HDL 51 10/15/2019   LDLCALC 84 10/15/2019   TRIG 135 10/15/2019   CHOLHDL 3.1 10/15/2019   - Denies: Chest pain, shortness of breath, myalgias, claudication  The 10-year ASCVD risk score Mikey Bussing DC Jr., et al., 2013) is: 2.5%   Values used to calculate the score:     Age: 32 years     Sex: Female     Is Non-Hispanic African American: No     Diabetic: No     Tobacco smoker: No     Systolic Blood Pressure: 326 mmHg     Is BP treated: Yes     HDL Cholesterol: 51 mg/dL     Total Cholesterol: 158 mg/dL  No family hx of MI or stroke  Hypertension:  Currently managed on chlorthalidone 25 mg and losartan 100 mg daily Pt reports good med compliance and denies any SE.   Blood pressure today is well controlled. BP Readings from Last 3 Encounters:  05/21/20 122/80  05/01/20 120/72  11/19/19 120/84   Pt denies CP, SOB, exertional sx, LE edema, palpitation, Ha's, visual disturbances, lightheadedness, hypotension, syncope.  She has history of GERD which is currently controlled with Pepcid she denies any worsening symptoms denies any history of GI bleed, melena, hematochezia, dysphagia  AR -on Claritin and Flonase-feel that symptoms are fairly well  controlled  History of vitamin D deficiency she would like her labs rechecked She also has history of hypokalemia and B12 deficiency -on a potassium supplement and over-the-counter B12 supplement   Current Outpatient Medications:  .  albuterol (VENTOLIN HFA) 108 (90 Base) MCG/ACT inhaler, INHALE 2 PUFFS INTO THE LUNGS EVERY 6 HOURS AS NEEDED FOR WHEEZING OR SHORTNESS OF BREATH, Disp: 18 g, Rfl: 2 .  Ascorbic Acid (VITAMIN C) 100 MG tablet, Take 500 mg by mouth daily., Disp: , Rfl:  .  aspirin 81 MG tablet, Take 81 mg by mouth daily., Disp: , Rfl:  .  chlorthalidone (HYGROTON) 25 MG tablet, TAKE 1 TABLET(25 MG) BY MOUTH DAILY, Disp: 90 tablet, Rfl: 1 .  cholecalciferol (VITAMIN D3) 25 MCG (1000 UNIT) tablet, Take 5,000 Units by mouth daily., Disp: , Rfl:  .  famotidine (PEPCID) 20 MG tablet, TAKE 1 TABLET BY MOUTH TWICE DAILY AS NEEDED FOR HEARTBURN OR INDIGESTION, Disp: 180 tablet, Rfl: 1 .  fluticasone (FLONASE) 50 MCG/ACT nasal spray, Place 2 sprays into both nostrils daily. (Patient taking differently: Place 2 sprays into both nostrils daily as needed. ), Disp: 16 g, Rfl: 6 .  Krill Oil 1000 MG CAPS, Take 1 capsule daily by mouth. , Disp: , Rfl:  .  loratadine (CLARITIN) 10 MG tablet, Take  1 tablet (10 mg total) by mouth daily as needed for allergies., Disp: 30 tablet, Rfl: 11 .  losartan (COZAAR) 100 MG tablet, TAKE 1 TABLET(100 MG) BY MOUTH DAILY, Disp: 90 tablet, Rfl: 1 .  Magnesium 100 MG CAPS, Take 1,000 mg by mouth., Disp: , Rfl:  .  Multiple Minerals-Vitamins (CALCIUM-MAGNESIUM-ZINC-D3) TABS, Take 2 tablets by mouth daily., Disp: , Rfl:  .  potassium chloride (KLOR-CON) 10 MEQ tablet, Take 1 tablet twice a week, Disp: 24 tablet, Rfl: 1 .  simvastatin (ZOCOR) 40 MG tablet, Take 1 tablet (40 mg total) by mouth at bedtime., Disp: 30 tablet, Rfl: 0 .  TURMERIC PO, Take by mouth daily. Reported on 03/28/2016, Disp: , Rfl:   Patient Active Problem List   Diagnosis Date Noted  . Hot flashes  07/15/2019  . Varicose veins of leg with pain, left 04/23/2019  . Hypokalemia 11/23/2017  . Postmenopausal bleeding 10/17/2017  . Varicose veins of both lower extremities without ulcer or inflammation 04/27/2017  . Sleep apnea 04/24/2017  . Preventative health care 04/24/2017  . Obesity (BMI 30.0-34.9) 11/04/2016  . Fatty liver 11/04/2016  . History of concussion 06/07/2016  . MCI (mild cognitive impairment) 06/07/2016  . Medication monitoring encounter 03/28/2016  . Prediabetes 03/28/2016  . Memory impairment 03/28/2016  . Decreased sense of smell 03/28/2016  . Hypercholesterolemia with hypertriglyceridemia 10/13/2015  . Encounter for screening mammogram for malignant neoplasm of breast 10/13/2015  . GERD without esophagitis 10/13/2015  . Screening for STD (sexually transmitted disease) 10/13/2015  . Allergic rhinitis 09/16/2015  . Lipoma of arm 09/16/2015  . Family history of colon cancer 09/16/2015  . Shortness of breath 05/27/2014  . Hypertension goal BP (blood pressure) < 140/90     Past Surgical History:  Procedure Laterality Date  . BREAST BIOPSY Right 1999   neg  . BREAST CYST ASPIRATION Right    neg  . TUBAL LIGATION      Family History  Problem Relation Age of Onset  . Migraines Mother   . Stroke Father   . Hypertension Father   . Hypertension Sister   . Anuerysm Brother   . Hypertension Sister   . Cancer Sister        cervical  . Cancer Sister        colon  . Breast cancer Neg Hx     Social History   Tobacco Use  . Smoking status: Never Smoker  . Smokeless tobacco: Never Used  Vaping Use  . Vaping Use: Never used  Substance Use Topics  . Alcohol use: Yes    Comment: social  . Drug use: No     Allergies  Allergen Reactions  . Ciprofloxacin   . Lisinopril   . Migraine Formula [Aspirin-Acetaminophen-Caffeine]     Midrina; broke out in rash   . Povidone Iodine Rash    Health Maintenance  Topic Date Due  . COVID-19 Vaccine (1) Never done   . PAP SMEAR-Modifier  04/24/2020  . INFLUENZA VACCINE  04/26/2020  . COLONOSCOPY  06/26/2020  . MAMMOGRAM  07/14/2020  . TETANUS/TDAP  11/04/2026  . Hepatitis C Screening  Completed  . HIV Screening  Completed    Chart Review Today: I personally reviewed active problem list, medication list, allergies, family history, social history, health maintenance, notes from last encounter, lab results, imaging with the patient/caregiver today.   Review of Systems  10 Systems reviewed and are negative for acute change except as noted in the HPI.  Objective:  Vitals:   05/01/20 1139  BP: 120/72  Pulse: 81  Resp: 14  Temp: 98 F (36.7 C)  SpO2: 97%  Weight: 175 lb 14.4 oz (79.8 kg)  Height: 5\' 2"  (1.575 m)    Body mass index is 32.17 kg/m.  Physical Exam Vitals and nursing note reviewed.  Constitutional:      General: She is not in acute distress.    Appearance: Normal appearance. She is well-developed. She is obese. She is not ill-appearing, toxic-appearing or diaphoretic.     Interventions: Face mask in place.  HENT:     Head: Normocephalic and atraumatic.     Right Ear: External ear normal.     Left Ear: External ear normal.  Eyes:     General: Lids are normal. No scleral icterus.       Right eye: No discharge.        Left eye: No discharge.     Conjunctiva/sclera: Conjunctivae normal.  Neck:     Trachea: Phonation normal. No tracheal deviation.  Cardiovascular:     Rate and Rhythm: Normal rate and regular rhythm.     Pulses: Normal pulses.          Radial pulses are 2+ on the right side and 2+ on the left side.       Posterior tibial pulses are 2+ on the right side and 2+ on the left side.     Heart sounds: Normal heart sounds. No murmur heard.  No friction rub. No gallop.   Pulmonary:     Effort: Pulmonary effort is normal. No respiratory distress.     Breath sounds: Normal breath sounds. No stridor. No wheezing, rhonchi or rales.  Chest:     Chest wall: No  tenderness.  Abdominal:     General: Bowel sounds are normal. There is no distension.     Palpations: Abdomen is soft.  Musculoskeletal:     Right lower leg: No edema.     Left lower leg: No edema.  Skin:    General: Skin is warm and dry.     Coloration: Skin is not jaundiced or pale.     Findings: No rash.  Neurological:     Mental Status: She is alert.     Motor: No abnormal muscle tone.     Gait: Gait normal.  Psychiatric:        Mood and Affect: Mood normal.        Speech: Speech normal.        Behavior: Behavior normal.         Assessment & Plan:     ICD-10-CM   1. Hypertension goal BP (blood pressure) < 140/90  F62 COMPLETE METABOLIC PANEL WITH GFR   Stable, well controlled with chlorthalidone and losartan, no side effects or concerns, encouraged DASH efforts, continue meds, check CMP  2. Hypercholesterolemia with hypertriglyceridemia  Z30.8 COMPLETE METABOLIC PANEL WITH GFR    Lipid panel    simvastatin (ZOCOR) 20 MG tablet   Patient has been compliant with simvastatin at 40 mg dose she would like to see if she can decrease the dose, checking labs today  3. GERD without esophagitis  K21.9    Stable, well controlled with Pepcid  4. Fatty liver  M57.8 COMPLETE METABOLIC PANEL WITH GFR    Lipid panel   History of fatty liver disease, monitor LFTs, encouraged diet lifestyle efforts for obesity, hypertension, hyperlipidemia  5. Prediabetes  I69.62 COMPLETE METABOLIC PANEL WITH GFR  Hemoglobin A1c   She has been working on avoiding carbs and sweets, would like to recheck A1c today  6. Vitamin D deficiency  E55.9 VITAMIN D 25 Hydroxy (Vit-D Deficiency, Fractures)   Patient notes deficiency, request it be rechecked today  7. Hypokalemia  T14.3 COMPLETE METABOLIC PANEL WITH GFR   History of low potassium, on prescription potassium supplement recheck chemistry and monitor  8. Class 1 obesity with body mass index (BMI) of 31.0 to 31.9 in adult, unspecified obesity type,  unspecified whether serious comorbidity present  E66.9    Z68.31    Encouraged continued diet lifestyle and exercise efforts, encouraged calorie deficit with avoiding simple sugars and carbs increasing fruits veggies, lean meats  9. Encounter for medication monitoring  Z51.81 CBC with Differential/Platelet    COMPLETE METABOLIC PANEL WITH GFR    Lipid panel    Hemoglobin A1c    VITAMIN D 25 Hydroxy (Vit-D Deficiency, Fractures)  10. Encounter for screening mammogram for malignant neoplasm of breast  Z12.31 MM 3D SCREEN BREAST BILATERAL    Return for Annual Physical - PAP and breast exam in next 2 months.   Delsa Grana, PA-C 05/01/20 11:34 AM

## 2020-05-02 LAB — CBC WITH DIFFERENTIAL/PLATELET
Absolute Monocytes: 321 cells/uL (ref 200–950)
Basophils Absolute: 50 cells/uL (ref 0–200)
Basophils Relative: 0.8 %
Eosinophils Absolute: 233 cells/uL (ref 15–500)
Eosinophils Relative: 3.7 %
HCT: 38.6 % (ref 35.0–45.0)
Hemoglobin: 13.1 g/dL (ref 11.7–15.5)
Lymphs Abs: 2545 cells/uL (ref 850–3900)
MCH: 29 pg (ref 27.0–33.0)
MCHC: 33.9 g/dL (ref 32.0–36.0)
MCV: 85.6 fL (ref 80.0–100.0)
MPV: 10.9 fL (ref 7.5–12.5)
Monocytes Relative: 5.1 %
Neutro Abs: 3150 cells/uL (ref 1500–7800)
Neutrophils Relative %: 50 %
Platelets: 232 10*3/uL (ref 140–400)
RBC: 4.51 10*6/uL (ref 3.80–5.10)
RDW: 13.4 % (ref 11.0–15.0)
Total Lymphocyte: 40.4 %
WBC: 6.3 10*3/uL (ref 3.8–10.8)

## 2020-05-02 LAB — COMPLETE METABOLIC PANEL WITH GFR
AG Ratio: 1.6 (calc) (ref 1.0–2.5)
ALT: 30 U/L — ABNORMAL HIGH (ref 6–29)
AST: 30 U/L (ref 10–35)
Albumin: 4.2 g/dL (ref 3.6–5.1)
Alkaline phosphatase (APISO): 96 U/L (ref 37–153)
BUN: 16 mg/dL (ref 7–25)
CO2: 27 mmol/L (ref 20–32)
Calcium: 9.1 mg/dL (ref 8.6–10.4)
Chloride: 101 mmol/L (ref 98–110)
Creat: 0.83 mg/dL (ref 0.50–1.05)
GFR, Est African American: 91 mL/min/{1.73_m2} (ref >=60)
GFR, Est Non African American: 78 mL/min/{1.73_m2} (ref >=60)
Globulin: 2.6 g/dL (calc) (ref 1.9–3.7)
Glucose, Bld: 96 mg/dL (ref 65–139)
Potassium: 3.5 mmol/L (ref 3.5–5.3)
Sodium: 139 mmol/L (ref 135–146)
Total Bilirubin: 0.4 mg/dL (ref 0.2–1.2)
Total Protein: 6.8 g/dL (ref 6.1–8.1)

## 2020-05-02 LAB — LIPID PANEL
Cholesterol: 148 mg/dL (ref <200)
HDL: 47 mg/dL — ABNORMAL LOW (ref >=50)
LDL Cholesterol (Calc): 58 mg/dL (calc)
Non-HDL Cholesterol (Calc): 101 mg/dL (calc) (ref <130)
Total CHOL/HDL Ratio: 3.1 (calc) (ref <5.0)
Triglycerides: 360 mg/dL — ABNORMAL HIGH (ref <150)

## 2020-05-02 LAB — HEMOGLOBIN A1C
Hgb A1c MFr Bld: 6.2 % of total Hgb — ABNORMAL HIGH (ref <5.7)
Mean Plasma Glucose: 131 (calc)
eAG (mmol/L): 7.3 (calc)

## 2020-05-02 LAB — VITAMIN D 25 HYDROXY (VIT D DEFICIENCY, FRACTURES): Vit D, 25-Hydroxy: 18 ng/mL — ABNORMAL LOW (ref 30–100)

## 2020-05-19 DIAGNOSIS — G4733 Obstructive sleep apnea (adult) (pediatric): Secondary | ICD-10-CM | POA: Diagnosis not present

## 2020-05-21 ENCOUNTER — Ambulatory Visit (INDEPENDENT_AMBULATORY_CARE_PROVIDER_SITE_OTHER): Payer: BC Managed Care – PPO | Admitting: Cardiovascular Disease

## 2020-05-21 ENCOUNTER — Other Ambulatory Visit: Payer: Self-pay

## 2020-05-21 ENCOUNTER — Encounter: Payer: Self-pay | Admitting: Cardiovascular Disease

## 2020-05-21 VITALS — BP 122/80 | HR 68 | Ht 62.0 in | Wt 174.6 lb

## 2020-05-21 DIAGNOSIS — R0600 Dyspnea, unspecified: Secondary | ICD-10-CM

## 2020-05-21 DIAGNOSIS — E785 Hyperlipidemia, unspecified: Secondary | ICD-10-CM | POA: Diagnosis not present

## 2020-05-21 DIAGNOSIS — I10 Essential (primary) hypertension: Secondary | ICD-10-CM | POA: Diagnosis not present

## 2020-05-21 DIAGNOSIS — R0609 Other forms of dyspnea: Secondary | ICD-10-CM

## 2020-05-21 NOTE — Patient Instructions (Signed)
Medication Instructions:  Your physician recommends that you continue on your current medications as directed. Please refer to the Current Medication list given to you today.  *If you need a refill on your cardiac medications before your next appointment, please call your pharmacy*   Lab Work: None ordered If you have labs (blood work) drawn today and your tests are completely normal, you will receive your results only by: Marland Kitchen MyChart Message (if you have MyChart) OR . A paper copy in the mail If you have any lab test that is abnormal or we need to change your treatment, we will call you to review the results.   Testing/Procedures: None ordered   Follow-Up: At [facility], you and your health needs are our priority.  As part of our continuing mission to provide you with exceptional heart care, we have created designated Provider Care Teams.  These Care Teams include your primary Cardiologist (physician) and Advanced Practice Providers (APPs -  Physician Assistants and Nurse Practitioners) who all work together to provide you with the care you need, when you need it.  We recommend signing up for the patient portal called "MyChart".  Sign up information is provided on this After Visit Summary.  MyChart is used to connect with patients for Virtual Visits (Telemedicine).  Patients are able to view lab/test results, encounter notes, upcoming appointments, etc.  Non-urgent messages can be sent to your provider as well.   To learn more about what you can do with MyChart, go to [email or web address].    Your next appointment:   As needed   The format for your next appointment:   In Person  Provider:    You may see Kathlyn [place], MD or one of the following Advanced Practice Providers on your designated Care Team:    Murray Hodgkins, NP  Christell Faith, PA-C  Marrianne Mood, PA-C    Other Instructions N/A

## 2020-05-21 NOTE — Progress Notes (Signed)
Cardiology Office Note   Date:  05/21/2020   ID:  Dunlap, Regina [birth year], MRN [id number]  PCP:  Delsa Grana, PA-C  Cardiologist:   Kathlyn Sacramento, MD   Chief Complaint  Patient presents with  . Follow-up    6 Month follow up. Medications verbally reviewed with patient.       History of Present Illness: Regina Dunlap is a 59 y.o. female who is she here today for a follow-up visit regarding exertional dyspnea.  She has known history of essential hypertension, hyperlipidemia, hepatic steatosis, obesity, sleep apnea on CPAP and GERD.  Previous echocardiogram in 2015 showed normal ejection fraction with no significant valvular abnormalities and no evidence of pulmonary hypertension.  Treadmill stress test was normal.  She was seen earlier this year with worsening fatigue and exertional dyspnea.  She underwent a Lexiscan Myoview which showed no evidence of ischemia with normal ejection fraction. Repeat echocardiogram in March of this year showed normal LV systolic function, grade 1 diastolic dysfunction and no significant valvular abnormalities. She reports improvement in shortness of breath.  No chest pain.  She does complain of cramps in her legs at night but not with exertion.   Past Medical History:  Diagnosis Date  . Esophageal reflux   . Fatty liver 11/04/2016  . Mastodynia   . Other and unspecified hyperlipidemia   . Overweight (BMI 25.0-29.9) 11/04/2016  . Pain in thoracic spine   . Prediabetes 03/28/2016  . Unspecified essential hypertension   . Unspecified sleep apnea     Past Surgical History:  Procedure Laterality Date  . BREAST BIOPSY Right 1999   neg  . BREAST CYST ASPIRATION Right    neg  . TUBAL LIGATION       Current Outpatient Medications  Medication Sig Dispense Refill  . Ascorbic Acid (VITAMIN C) 100 MG tablet Take 500 mg by mouth daily.    . Ascorbic Acid (VITAMIN C) 100 MG tablet Take 100 mg by mouth daily.    Marland Kitchen aspirin 81 MG  tablet Take 81 mg by mouth daily.    . chlorthalidone (HYGROTON) 25 MG tablet TAKE 1 TABLET(25 MG) BY MOUTH DAILY 90 tablet 1  . cholecalciferol (VITAMIN D3) 25 MCG (1000 UNIT) tablet Take 5,000 Units by mouth daily.    . famotidine (PEPCID) 20 MG tablet TAKE 1 TABLET BY MOUTH TWICE DAILY AS NEEDED FOR HEARTBURN OR INDIGESTION 180 tablet 1  . fluticasone (FLONASE) 50 MCG/ACT nasal spray Place 2 sprays into both nostrils daily. (Patient taking differently: Place 2 sprays into both nostrils daily as needed. ) 16 g 6  . Krill Oil 1000 MG CAPS Take 1 capsule daily by mouth.     . loratadine (CLARITIN) 10 MG tablet Take 1 tablet (10 mg total) by mouth daily as needed for allergies. 30 tablet 11  . losartan (COZAAR) 100 MG tablet TAKE 1 TABLET(100 MG) BY MOUTH DAILY 90 tablet 1  . Magnesium 100 MG CAPS Take 1,000 mg by mouth.    . potassium chloride (KLOR-CON) 10 MEQ tablet Take 1 tablet twice a week 24 tablet 1  . simvastatin (ZOCOR) 20 MG tablet Take 1 tablet (20 mg total) by mouth at bedtime. 90 tablet 3  . TURMERIC PO Take by mouth daily. Reported on 03/28/2016    . vitamin B-12 (CYANOCOBALAMIN) 100 MCG tablet Take 100 mcg by mouth daily.     No current facility-administered medications for this visit.    Allergies:  Ciprofloxacin, Lisinopril, Migraine formula [aspirin-acetaminophen-caffeine], and Povidone iodine    Social History:  The patient  reports that she has never smoked. She has never used smokeless tobacco. She reports current alcohol use. She reports that she does not use drugs.   Family History:  The patient's family history includes Anuerysm in her brother; Cancer in her sister and sister; Hypertension in her father, sister, and sister; Migraines in her mother; Stroke in her father.    ROS:  Please see the history of present illness.   Otherwise, review of systems are positive for none.   All other systems are reviewed and negative.    PHYSICAL EXAM: VS:  BP 122/80 (BP  Location: Left Arm, Patient Position: Sitting, Cuff Size: Normal)   Pulse 68   Ht 5\' 2"  (1.575 m)   Wt 174 lb 9.6 oz (79.2 kg)   SpO2 95%   BMI 31.93 kg/m  , BMI Body mass index is 31.93 kg/m. GEN: Well nourished, well developed, in no acute distress  HEENT: normal  Neck: no JVD, carotid bruits, or masses Cardiac: RRR; no murmurs, rubs, or gallops,no edema  Respiratory:  clear to auscultation bilaterally, normal work of breathing GI: soft, nontender, nondistended, + BS MS: no deformity or atrophy  Skin: warm and dry, no rash Neuro:  Strength and sensation are intact Psych: euthymic mood, full affect Distal pulses are palpable.  EKG:  EKG is ordered today. The ekg ordered today demonstrates normal sinus rhythm with no significant ST or T wave changes.  Sinus arrhythmia.   Recent Labs: 10/15/2019: TSH 2.03 05/01/2020: ALT 30; BUN 16; Creat 0.83; Hemoglobin 13.1; Platelets 232; Potassium 3.5; Sodium 139    Lipid Panel    Component Value Date/Time   CHOL 148 05/01/2020 1144   CHOL 181 10/13/2015 0911   TRIG 360 (H) 05/01/2020 1144   HDL 47 (L) 05/01/2020 1144   HDL 54 10/13/2015 0911   CHOLHDL 3.1 05/01/2020 1144   VLDL 38 (H) 04/24/2017 1528   LDLCALC 58 05/01/2020 1144      Wt Readings from Last 3 Encounters:  05/21/20 174 lb 9.6 oz (79.2 kg)  05/01/20 175 lb 14.4 oz (79.8 kg)  11/29/19 172 lb (78 kg)      No flowsheet data found.    ASSESSMENT AND PLAN:  1.  Exertional dyspnea: Likely noncardiac with negative work-up including Lexiscan Myoview and echocardiogram.    2. Leg cramps: Mostly at night and not with activities.  Dorsalis pedis is strong and palpable bilaterally.  Doubt PAD.  3. Essential hypertension: Blood pressure is controlled on current medications.  4. Hyperlipidemia: Currently on simvastatin with most recent LDL of 68.    Disposition:   FU with me as needed.   Signed,  Kathlyn Sacramento, MD  05/21/2020 10:22 AM    [place]

## 2020-06-03 ENCOUNTER — Other Ambulatory Visit: Payer: BC Managed Care – PPO

## 2020-06-03 ENCOUNTER — Other Ambulatory Visit: Payer: Self-pay

## 2020-06-03 DIAGNOSIS — Z20822 Contact with and (suspected) exposure to covid-19: Secondary | ICD-10-CM

## 2020-06-05 LAB — NOVEL CORONAVIRUS, NAA: SARS-CoV-2, NAA: NOT DETECTED

## 2020-06-05 LAB — SARS-COV-2, NAA 2 DAY TAT

## 2020-06-16 ENCOUNTER — Other Ambulatory Visit: Payer: Self-pay

## 2020-06-16 MED ORDER — CHLORTHALIDONE 25 MG PO TABS: ORAL_TABLET | ORAL | 3 refills | Status: DC

## 2020-06-18 NOTE — Progress Notes (Signed)
Patient: Regina Dunlap, Female    DOB: Jan 07, 1961, 59 y.o.   MRN: [id number] Delsa Grana, PA-C Visit Date: 06/19/2020  Today's Provider: Delsa Grana, PA-C   Chief Complaint  Patient presents with   Annual Exam   Subjective:   Annual physical exam:  Regina Dunlap is a 59 y.o. female who presents today for complete physical exam:  Exercise/Activity:  Hiking and walking Diet/nutrition:   Doing more veggies and working on diet Sleep: good  OSA CPAP -  Per Dr. Radford Pax asked about it in March and never heard   USPSTF grade A and B recommendations - reviewed and addressed today  Depression:  Phq 9 completed today by patient, was reviewed by me with patient in the room PHQ score is neg, pt feels good PHQ 2/9 Scores 06/19/2020 05/01/2020 10/15/2019 07/15/2019  PHQ - 2 Score 0 0 0 0  PHQ- 9 Score - 0 0 0   Depression screen [facility] 2/9 06/19/2020 05/01/2020 10/15/2019 07/15/2019 02/06/2019  Decreased Interest 0 0 0 0 0  Down, Depressed, Hopeless 0 0 0 0 0  PHQ - 2 Score 0 0 0 0 0  Altered sleeping - 0 0 0 0  Tired, decreased energy - 0 0 0 0  Change in appetite - 0 0 0 0  Feeling bad or failure about yourself  - 0 0 0 0  Trouble concentrating - 0 0 0 0  Moving slowly or fidgety/restless - 0 0 0 0  Suicidal thoughts - 0 0 0 0  PHQ-9 Score - 0 0 0 0  Difficult doing work/chores - Not difficult at all Not difficult at all Not difficult at all Not difficult at all  Some recent data might be hidden    Alcohol screening:   [facility] Visit from 06/19/2020 in [facility]  AUDIT-C Score 1      Immunizations and Health Maintenance: Health Maintenance  Topic Date Due   PAP SMEAR-Modifier  04/24/2020   COLONOSCOPY  06/26/2020   MAMMOGRAM  07/14/2020   TETANUS/TDAP  11/04/2026   INFLUENZA VACCINE  Completed   COVID-19 Vaccine  Completed   Hepatitis C Screening  Completed   HIV Screening  Completed     Hep C Screening: done 12/16/16  STD  testing and prevention (HIV/chl/gon/syphilis):  Done 10/13/15 see above, no additional testing desired by pt today  Intimate partner violence:  Safe, denies   Sexual History/Pain during Intercourse:   Married no compliant  Menstrual History/LMP/Abnormal Bleeding: no AUB No LMP recorded. Patient is postmenopausal. Years ago menopause  Incontinence Symptoms: none, denies  Breast cancer:  Last Mammogram: ordered last visit see HM list above BRCA gene screening: none  Cervical cancer screening: ordered last done 04/24/2017 - due today  Pt no family hx of cancers - breast, ovarian, uterine  Sister with colon CA      Osteoporosis:  N/A Discussion on osteoporosis per age, including high calcium and vitamin D supplementation, weight bearing exercises Pt is taking supplementing with daily calcium/Vit D. 500?  Skin cancer:  Hx of skin CA -  NO Discussed atypical lesions   Colorectal cancer:   Colonoscopy is last done 06/27/2015 and needed to be repeated in 5 years: ordered Discussed concerning signs and sx of CRC, pt denies melena, hematochezia or change in bowels  Lung cancer:   Low Dose CT Chest recommended if Age 35-80 years, 30 pack-year currently smoking OR have quit w/in 15years. Patient does not qualify.  Social History   Tobacco Use   Smoking status: Never Smoker   Smokeless tobacco: Never Used  Vaping Use   Vaping Use: Never used  Substance Use Topics   Alcohol use: Yes    Comment: social   Drug use: No       [facility]/19/2020 [facility]  AUDIT-C Score 1      Family History  Problem Relation Age of Onset   Migraines Mother    Stroke Father    Hypertension Father    Hypertension Sister    Anuerysm Brother    Hypertension Sister    Cancer Sister        cervical   Cancer Sister        colon   Breast cancer Neg Hx      Blood pressure/Hypertension: BP Readings from Last 3 Encounters:  06/19/20 118/78    05/21/20 122/80  05/01/20 120/72    Weight/Obesity: Wt Readings from Last 3 Encounters:  06/19/20 177 lb 12.8 oz (80.6 kg)  05/21/20 174 lb 9.6 oz (79.2 kg)  05/01/20 175 lb 14.4 oz (79.8 kg)   BMI Readings from Last 3 Encounters:  06/19/20 32.52 kg/m  05/21/20 31.93 kg/m  05/01/20 32.17 kg/m     Lipids:  Lab Results  Component Value Date   CHOL 148 05/01/2020   CHOL 158 10/15/2019   CHOL 150 02/21/2019   Lab Results  Component Value Date   HDL 47 (L) 05/01/2020   HDL 51 10/15/2019   HDL 49 (L) 02/21/2019   Lab Results  Component Value Date   LDLCALC 58 05/01/2020   LDLCALC 84 10/15/2019   LDLCALC 76 02/21/2019   Lab Results  Component Value Date   TRIG 360 (H) 05/01/2020   TRIG 135 10/15/2019   TRIG 151 (H) 02/21/2019   Lab Results  Component Value Date   CHOLHDL 3.1 05/01/2020   CHOLHDL 3.1 10/15/2019   CHOLHDL 3.1 02/21/2019   No results found for: LDLDIRECT Based on the results of lipid panel his/her cardiovascular risk factor ( using [place] )  in the next 10 years is: The 10-year ASCVD risk score Mikey Bussing DC Brooke Bonito., et al., 2013) is: 2.4%   Values used to calculate the score:     Age: 24 years     Sex: Female     Is Non-Hispanic African American: No     Diabetic: No     Tobacco smoker: No     Systolic Blood Pressure: 867 mmHg     Is BP treated: Yes     HDL Cholesterol: 47 mg/dL     Total Cholesterol: 148 mg/dL Glucose:  Glucose, Bld  Date Value Ref Range Status  05/01/2020 96 65 - 139 mg/dL Final    Comment:    .        Non-fasting reference interval .   10/15/2019 109 (H) 65 - 99 mg/dL Final    Comment:    .            Fasting reference interval . For someone without known diabetes, a glucose value between 100 and 125 mg/dL is consistent with prediabetes and should be confirmed with a follow-up test. .   02/21/2019 113 (H) 65 - 99 mg/dL Final    Comment:    For someone without known diabetes, a glucose value between 100 and  125 mg/dL is consistent with prediabetes and should be confirmed with a follow-up test. . Marland Kitchen  Fasting reference interval .    Hypertension: BP Readings from Last 3 Encounters:  06/19/20 118/78  05/21/20 122/80  05/01/20 120/72   Obesity: Wt Readings from Last 3 Encounters:  06/19/20 177 lb 12.8 oz (80.6 kg)  05/21/20 174 lb 9.6 oz (79.2 kg)  05/01/20 175 lb 14.4 oz (79.8 kg)   BMI Readings from Last 3 Encounters:  06/19/20 32.52 kg/m  05/21/20 31.93 kg/m  05/01/20 32.17 kg/m      Advanced Care Planning:  A voluntary discussion about advance care planning including the explanation and discussion of advance directives.   Discussed health care proxy and Living will, and the patient was able to identify a health care proxy as her husband Elta [place].   Patient does have a living will at present time.   Social History      She        Social History   Socioeconomic History   Marital status: Married    Spouse name: Not on file   Number of children: Not on file   Years of education: Not on file   Highest education level: Not on file  Occupational History   Not on file  Tobacco Use   Smoking status: Never Smoker   Smokeless tobacco: Never Used  Vaping Use   Vaping Use: Never used  Substance and Sexual Activity   Alcohol use: Yes    Comment: social   Drug use: No   Sexual activity: Yes  Other Topics Concern   Not on file  Social History Narrative   Not on file   Social Determinants of Health   Financial Resource Strain: Low Risk    Difficulty of Paying Living Expenses: Not hard at all  Food Insecurity: No Food Insecurity   Worried About Charity fundraiser in the Last Year: Never true   [place] in the Last Year: Never true  Transportation Needs: No Transportation Needs   Lack of Transportation (Medical): No   Lack of Transportation (Non-Medical): No  Physical Activity: Insufficiently Active   Days of Exercise per Week: 3  days   Minutes of Exercise per Session: 30 min  Stress: No Stress Concern Present   Feeling of Stress : Only a little  Social Connections: Moderately Isolated   Frequency of Communication with Friends and Family: More than three times a week   Frequency of Social Gatherings with Friends and Family: Never   Attends Religious Services: Never   Marine scientist or Organizations: No   Attends Music therapist: Never   Marital Status: Married    Family History        Family History  Problem Relation Age of Onset   Migraines Mother    Stroke Father    Hypertension Father    Hypertension Sister    Anuerysm Brother    Hypertension Sister    Cancer Sister        cervical   Cancer Sister        colon   Breast cancer Neg Hx     Patient Active Problem List   Diagnosis Date Noted   Vitamin D deficiency 05/01/2020   Hot flashes 07/15/2019   Varicose veins of leg with pain, left 04/23/2019   Hypokalemia 11/23/2017   Postmenopausal bleeding 10/17/2017   Varicose veins of both lower extremities without ulcer or inflammation 04/27/2017   Sleep apnea 04/24/2017   Preventative health care 04/24/2017   Obesity (BMI 30.0-34.9) 11/04/2016  Fatty liver 11/04/2016   History of concussion 06/07/2016   MCI (mild cognitive impairment) 06/07/2016   Medication monitoring encounter 03/28/2016   Prediabetes 03/28/2016   Memory impairment 03/28/2016   Decreased sense of smell 03/28/2016   Hypercholesterolemia with hypertriglyceridemia 10/13/2015   Encounter for screening mammogram for malignant neoplasm of breast 10/13/2015   GERD without esophagitis 10/13/2015   Screening for STD (sexually transmitted disease) 10/13/2015   Allergic rhinitis 09/16/2015   Lipoma of arm 09/16/2015   Family history of colon cancer 09/16/2015   Shortness of breath 05/27/2014   Hypertension goal BP (blood pressure) < 140/90     Past Surgical History:    Procedure Laterality Date   BREAST BIOPSY Right 1999   neg   BREAST CYST ASPIRATION Right    neg   TUBAL LIGATION       Current Outpatient Medications:    Ascorbic Acid (VITAMIN C) 100 MG tablet, Take 500 mg by mouth daily., Disp: , Rfl:    Ascorbic Acid (VITAMIN C) 100 MG tablet, Take 100 mg by mouth daily., Disp: , Rfl:    aspirin 81 MG tablet, Take 81 mg by mouth daily., Disp: , Rfl:    chlorthalidone (HYGROTON) 25 MG tablet, TAKE 1 TABLET(25 MG) BY MOUTH DAILY, Disp: 90 tablet, Rfl: 3   cholecalciferol (VITAMIN D3) 25 MCG (1000 UNIT) tablet, Take 5,000 Units by mouth daily., Disp: , Rfl:    famotidine (PEPCID) 20 MG tablet, TAKE 1 TABLET BY MOUTH TWICE DAILY AS NEEDED FOR HEARTBURN OR INDIGESTION, Disp: 180 tablet, Rfl: 1   fluticasone (FLONASE) 50 MCG/ACT nasal spray, Place 2 sprays into both nostrils daily. (Patient taking differently: Place 2 sprays into both nostrils daily as needed. ), Disp: 16 g, Rfl: 6   Krill Oil 1000 MG CAPS, Take 1 capsule daily by mouth. , Disp: , Rfl:    loratadine (CLARITIN) 10 MG tablet, Take 1 tablet (10 mg total) by mouth daily as needed for allergies., Disp: 30 tablet, Rfl: 11   losartan (COZAAR) 100 MG tablet, Take 1 tablet (100 mg total) by mouth daily., Disp: 90 tablet, Rfl: 3   Magnesium 100 MG CAPS, Take 1,000 mg by mouth., Disp: , Rfl:    potassium chloride (KLOR-CON) 10 MEQ tablet, Take 1 tablet twice a week, Disp: 24 tablet, Rfl: 1   simvastatin (ZOCOR) 20 MG tablet, Take 1 tablet (20 mg total) by mouth at bedtime., Disp: 90 tablet, Rfl: 3   TURMERIC PO, Take by mouth daily. Reported on 03/28/2016, Disp: , Rfl:    vitamin B-12 (CYANOCOBALAMIN) 100 MCG tablet, Take 100 mcg by mouth daily., Disp: , Rfl:   Allergies  Allergen Reactions   Ciprofloxacin    Lisinopril    Migraine Formula [Aspirin-Acetaminophen-Caffeine]     Midrina; broke out in rash    Povidone Iodine Rash    Patient Care Team: Delsa Grana, PA-C as  PCP - General (Family Medicine) Wellington Hampshire, MD as PCP - Cardiology (Cardiology)  Review of Systems  10 Systems reviewed and are negative for acute change except as noted in the HPI.  I personally reviewed active problem list, medication list, allergies, family history, social history, health maintenance, notes from last encounter, lab results, imaging with the patient/caregiver today.         Objective:   Vitals:  Vitals:   06/19/20 1353  BP: 118/78  Pulse: 74  Resp: 16  Temp: 97.9 F (36.6 C)  TempSrc: Oral  SpO2: 99%  Weight: 177 lb 12.8 oz (80.6 kg)  Height: _0  (1.575 m)    Body mass index is 32.52 kg/m.  Physical Exam Vitals and nursing note reviewed. Exam conducted with a chaperone present.  Constitutional:      General: She is not in acute distress.    Appearance: Normal appearance. She is well-developed. She is not toxic-appearing or diaphoretic.  HENT:     Head: Normocephalic and atraumatic.     Right Ear: External ear normal.     Left Ear: External ear normal.     Nose: Nose normal.     Mouth/Throat:     Pharynx: Uvula midline.  Eyes:     General: Lids are normal.     Conjunctiva/sclera: Conjunctivae normal.     Pupils: Pupils are equal, round, and reactive to light.  Neck:     Thyroid: No thyroid mass, thyromegaly or thyroid tenderness.     Trachea: Phonation normal. No tracheal deviation.  Cardiovascular:     Rate and Rhythm: Normal rate and regular rhythm.     Pulses: Normal pulses.          Radial pulses are 2+ on the right side and 2+ on the left side.       Posterior tibial pulses are 2+ on the right side and 2+ on the left side.     Heart sounds: Normal heart sounds. No murmur heard.  No friction rub. No gallop.   Pulmonary:     Effort: Pulmonary effort is normal. No respiratory distress.     Breath sounds: Normal breath sounds. No stridor. No wheezing, rhonchi or rales.  Chest:     Chest wall: No tenderness.     Breasts: Breasts  are symmetrical.        Right: Normal. No swelling, bleeding, inverted nipple, mass, nipple discharge, skin change or tenderness.        Left: Normal. No swelling, bleeding, inverted nipple, mass, nipple discharge, skin change or tenderness.  Abdominal:     General: Bowel sounds are normal. There is no distension.     Palpations: Abdomen is soft.     Tenderness: There is no abdominal tenderness. There is no guarding or rebound.  Genitourinary:    Vagina: No signs of injury. Prolapsed vaginal walls present. No vaginal discharge, erythema, tenderness, bleeding or lesions.     Cervix: Normal.     Uterus: Normal.      Adnexa: Right adnexa normal and left adnexa normal.     Comments: PAP completed with broom Musculoskeletal:        General: No deformity. Normal range of motion.     Cervical back: Normal range of motion and neck supple.  Lymphadenopathy:     Cervical: No cervical adenopathy.     Upper Body:     Right upper body: No supraclavicular, axillary or pectoral adenopathy.     Left upper body: No supraclavicular, axillary or pectoral adenopathy.  Skin:    General: Skin is warm and dry.     Capillary Refill: Capillary refill takes less than 2 seconds.     Coloration: Skin is not pale.     Findings: No rash.  Neurological:     Mental Status: She is alert and oriented to person, place, and time.     Motor: No abnormal muscle tone.     Gait: Gait normal.  Psychiatric:        Speech: Speech normal.        Behavior:  Behavior normal.       Fall Risk: Fall Risk  06/19/2020 05/01/2020 10/15/2019 07/15/2019 02/06/2019  Falls in the past year? 0 0 0 0 1  Number falls in past yr: 0 0 0 0 0  Injury with Fall? 0 0 0 0 0  Follow up Falls evaluation completed - Falls evaluation completed Falls evaluation completed -    Functional Status Survey: Is the patient deaf or have difficulty hearing?: No Does the patient have difficulty seeing, even when wearing glasses/contacts?: No Does the  patient have difficulty concentrating, remembering, or making decisions?: No Does the patient have difficulty walking or climbing stairs?: No Does the patient have difficulty dressing or bathing?: No Does the patient have difficulty doing errands alone such as visiting a doctor's office or shopping?: No   Assessment & Plan:    CPE completed today   USPSTF grade A and B recommendations reviewed with patient; age-appropriate recommendations, preventive care, screening tests, etc discussed and encouraged; healthy living encouraged; see AVS for patient education given to patient   Discussed importance of 150 minutes of physical activity weekly, AHA exercise recommendations given to pt in AVS/handout   Discussed importance of healthy diet:  eating lean meats and proteins, avoiding trans fats and saturated fats, avoid simple sugars and excessive carbs in diet, eat 6 servings of fruit/vegetables daily and drink plenty of water and avoid sweet beverages.     Recommended pt to do annual eye exam and routine dental exams/cleanings   Depression, alcohol, fall screening completed as documented above and per flowsheets   Reviewed Health Maintenance: Health Maintenance  Topic Date Due   PAP SMEAR-Modifier  04/24/2020   COLONOSCOPY  06/26/2020   MAMMOGRAM  07/14/2020   TETANUS/TDAP  11/04/2026   INFLUENZA VACCINE  Completed   COVID-19 Vaccine  Completed   Hepatitis C Screening  Completed   HIV Screening  Completed     Immunizations: Immunization History  Administered Date(s) Administered   Influenza, Seasonal, Injecte, Preservative Fre 07/05/2014   Influenza,inj,Quad PF,6+ Mos 07/12/2016, 05/28/2017, 06/18/2018, 06/24/2019, 06/19/2020   Influenza,inj,quad, With Preservative 07/12/2016   PFIZER SARS-COV-2 Vaccination 12/11/2019, 01/15/2020   Tdap 11/04/2016   Zoster Recombinat (Shingrix) 09/25/2019, 11/26/2019         ICD-10-CM   1. Annual physical exam  Z00.00 CBC  with Differential/Platelet    COMPLETE METABOLIC PANEL WITH GFR    Lipid panel    Hemoglobin A1c  2. Screening for colon cancer  Z12.11 Ambulatory referral to Gastroenterology  3. Screening for cervical cancer  Z12.4 Cytology - PAP  4. Need for influenza vaccination  Z23 Flu Vaccine QUAD 6+ mos PF IM (Fluarix Quad PF)  5. Hypertension goal BP (blood pressure) < 140/90  I10 losartan (COZAAR) 100 MG tablet   med refill  6. Family history of colon cancer  Z80.0    sister in early 66's, pt started early screening, is overdue for 5 year f/up, moved to the area and needs to est locally   Pt will get her labs done in the next 2-3 months since just recently checked  Delsa Grana, PA-C 06/19/20 5:31 PM  [place]

## 2020-06-19 ENCOUNTER — Encounter: Payer: Self-pay | Admitting: Family Medicine

## 2020-06-19 ENCOUNTER — Ambulatory Visit (INDEPENDENT_AMBULATORY_CARE_PROVIDER_SITE_OTHER): Payer: BC Managed Care – PPO | Admitting: Family Medicine

## 2020-06-19 ENCOUNTER — Other Ambulatory Visit (HOSPITAL_COMMUNITY)
Admission: RE | Admit: 2020-06-19 | Discharge: 2020-06-19 | Disposition: A | Discharge status: Home / Self Care | Payer: BC Managed Care – PPO | Source: Ambulatory Visit | Attending: Family Medicine | Admitting: Family Medicine

## 2020-06-19 ENCOUNTER — Other Ambulatory Visit: Payer: Self-pay

## 2020-06-19 VITALS — BP 118/78 | HR 74 | Temp 97.9°F | Resp 16 | Ht 62.0 in | Wt 177.8 lb

## 2020-06-19 DIAGNOSIS — Z0000 Encounter for general adult medical examination without abnormal findings: Secondary | ICD-10-CM

## 2020-06-19 DIAGNOSIS — Z23 Encounter for immunization: Secondary | ICD-10-CM

## 2020-06-19 DIAGNOSIS — Z124 Encounter for screening for malignant neoplasm of cervix: Secondary | ICD-10-CM | POA: Diagnosis not present

## 2020-06-19 DIAGNOSIS — I10 Essential (primary) hypertension: Secondary | ICD-10-CM

## 2020-06-19 DIAGNOSIS — Z1211 Encounter for screening for malignant neoplasm of colon: Secondary | ICD-10-CM

## 2020-06-19 DIAGNOSIS — G4733 Obstructive sleep apnea (adult) (pediatric): Secondary | ICD-10-CM | POA: Diagnosis not present

## 2020-06-19 DIAGNOSIS — Z800 Family history of malignant neoplasm of digestive organs: Secondary | ICD-10-CM

## 2020-06-19 MED ORDER — LOSARTAN POTASSIUM 100 MG PO TABS: 100.0000 mg | ORAL_TABLET | Freq: Every day | ORAL | 3 refills | Status: DC

## 2020-06-19 NOTE — Patient Instructions (Signed)
Preventive Care 40-57 Years Old, Female Preventive care refers to visits with your health care provider and lifestyle choices that can promote health and wellness. This includes:  A yearly physical exam. This may also be called an annual well check.  Regular dental visits and eye exams.  Immunizations.  Screening for certain conditions.  Healthy lifestyle choices, such as eating a healthy diet, getting regular exercise, not using drugs or products that contain nicotine and tobacco, and limiting alcohol use. What can I expect for my preventive care visit? Physical exam Your health care provider will check your:  Height and weight. This may be used to calculate body mass index (BMI), which tells if you are at a healthy weight.  Heart rate and blood pressure.  Skin for abnormal spots. Counseling Your health care provider may ask you questions about your:  Alcohol, tobacco, and drug use.  Emotional well-being.  Home and relationship well-being.  Sexual activity.  Eating habits.  Work and work environment.  Method of birth control.  Menstrual cycle.  Pregnancy history. What immunizations do I need?  Influenza (flu) vaccine  This is recommended every year. Tetanus, diphtheria, and pertussis (Tdap) vaccine  You may need a Td booster every 10 years. Varicella (chickenpox) vaccine  You may need this if you have not been vaccinated. Zoster (shingles) vaccine  You may need this after age 60. Measles, mumps, and rubella (MMR) vaccine  You may need at least one dose of MMR if you were born in 1957 or later. You may also need a second dose. Pneumococcal conjugate (PCV13) vaccine  You may need this if you have certain conditions and were not previously vaccinated. Pneumococcal polysaccharide (PPSV23) vaccine  You may need one or two doses if you smoke cigarettes or if you have certain conditions. Meningococcal conjugate (MenACWY) vaccine  You may need this if you  have certain conditions. Hepatitis A vaccine  You may need this if you have certain conditions or if you travel or work in places where you may be exposed to hepatitis A. Hepatitis B vaccine  You may need this if you have certain conditions or if you travel or work in places where you may be exposed to hepatitis B. Haemophilus influenzae type b (Hib) vaccine  You may need this if you have certain conditions. Human papillomavirus (HPV) vaccine  If recommended by your health care provider, you may need three doses over 6 months. You may receive vaccines as individual doses or as more than one vaccine together in one shot (combination vaccines). Talk with your health care provider about the risks and benefits of combination vaccines. What tests do I need? Blood tests  Lipid and cholesterol levels. These may be checked every 5 years, or more frequently if you are over 50 years old.  Hepatitis C test.  Hepatitis B test. Screening  Lung cancer screening. You may have this screening every year starting at age 55 if you have a 30-pack-year history of smoking and currently smoke or have quit within the past 15 years.  Colorectal cancer screening. All adults should have this screening starting at age 50 and continuing until age 75. Your health care provider may recommend screening at age 45 if you are at increased risk. You will have tests every 1-10 years, depending on your results and the type of screening test.  Diabetes screening. This is done by checking your blood sugar (glucose) after you have not eaten for a while (fasting). You may have this   done every 1-3 years.  Mammogram. This may be done every 1-2 years. Talk with your health care provider about when you should start having regular mammograms. This may depend on whether you have a family history of breast cancer.  BRCA-related cancer screening. This may be done if you have a family history of breast, ovarian, tubal, or peritoneal  cancers.  Pelvic exam and Pap test. This may be done every 3 years starting at age 56. Starting at age 58, this may be done every 5 years if you have a Pap test in combination with an HPV test. Other tests  Sexually transmitted disease (STD) testing.  Bone density scan. This is done to screen for osteoporosis. You may have this scan if you are at high risk for osteoporosis. Follow these instructions at home: Eating and drinking  Eat a diet that includes fresh fruits and vegetables, whole grains, lean protein, and low-fat dairy.  Take vitamin and mineral supplements as recommended by your health care provider.  Do not drink alcohol if: ? Your health care provider tells you not to drink. ? You are pregnant, may be pregnant, or are planning to become pregnant.  If you drink alcohol: ? Limit how much you have to 0-1 drink a day. ? Be aware of how much alcohol is in your drink. In the [place] one drink equals one 12 oz bottle of beer (355 mL), one 5 oz glass of wine (148 mL), or one 1 oz glass of hard liquor (44 mL). Lifestyle  Take daily care of your teeth and gums.  Stay active. Exercise for at least 30 minutes on 5 or more days each week.  Do not use any products that contain nicotine or tobacco, such as cigarettes, e-cigarettes, and chewing tobacco. If you need help quitting, ask your health care provider.  If you are sexually active, practice safe sex. Use a condom or other form of birth control (contraception) in order to prevent pregnancy and STIs (sexually transmitted infections).  If told by your health care provider, take low-dose aspirin daily starting at age 40. What's next?  Visit your health care provider once a year for a well check visit.  Ask your health care provider how often you should have your eyes and teeth checked.  Stay up to date on all vaccines. This information is not intended to replace advice given to you by your health care provider. Make sure you  discuss any questions you have with your health care provider. Document Revised: 05/24/2018 Document Reviewed: 05/24/2018 Elsevier Patient Education  2020 [place].   Piriformis Syndrome Rehab Ask your health care provider which exercises are safe for you. Do exercises exactly as told by your health care provider and adjust them as directed. It is normal to feel mild stretching, pulling, tightness, or discomfort as you do these exercises. Stop right away if you feel sudden pain or your pain gets worse. Do not begin these exercises until told by your health care provider. Stretching and range-of-motion exercises These exercises warm up your muscles and joints and improve the movement and flexibility of your hip and pelvis. The exercises also help to relieve pain, numbness, and tingling. Hip rotation This is an exercise in which you lie on your back and stretch the muscles that rotate your hip (hip rotators) to stretch your buttocks. 1. Lie on your back on a firm surface. 2. Pull your left / right knee toward your same shoulder with your left / right hand  until your knee is pointing toward the ceiling. Hold your left / right ankle with your other hand. 3. Keeping your knee steady, gently pull your left / right ankle toward your other shoulder until you feel a stretch in your buttocks. 4. Hold this position for __________ seconds. Repeat __________ times. Complete this exercise __________ times a day. Hip extensor This is an exercise in which you lie on your back and pull your knee to your chest. 1. Lie on your back on a firm surface. Both of your legs should be straight. 2. Pull your left / right knee to your chest. Hold your leg in this position by holding onto the back of your thigh or the front of your knee. 3. Hold this position for __________ seconds. 4. Slowly return to the starting position. Repeat __________ times. Complete this exercise __________ times a day. Strengthening  exercises These exercises build strength and endurance in your hip and thigh muscles. Endurance is the ability to use your muscles for a long time, even after they get tired. Straight leg raises, side-lying This exercise strengthens the muscles that rotate the leg at the hip and move it away from your body (hip abductors). 1. Lie on your side with your left / right leg in the top position. Lie so your head, shoulder, knee, and hip line up. Bend your bottom knee to help you balance. 2. Lift your top leg 4-6 inches (10-15 cm) while keeping your toes pointed straight ahead. 3. Hold this position for __________ seconds. 4. Slowly lower your leg to the starting position. 5. Let your muscles relax completely after each repetition. Repeat __________ times. Complete this exercise __________ times a day. Hip abduction and rotation This is sometimes called quadruped (on hands and knees) exercises. 1. Get on your hands and knees on a firm, lightly padded surface. Your hands should be directly below your shoulders, and your knees should be directly below your hips. 2. Lift your left / right knee out to the side. Keep your knee bent. Do not twist your body. 3. Hold this position for __________ seconds. 4. Slowly lower your leg. Repeat __________ times. Complete this exercise __________ times a day. Straight leg raises, face-down This exercise stretches the muscles that move your hips away from the front of the pelvis (hip extensors). 1. Lie on your abdomen on a bed or a firm surface with a pillow under your hips. 2. Squeeze your buttocks muscles and lift your left / right leg about 4-6 inches (10-15 cm) off the bed. Do not let your back arch. 3. Hold this position for __________ seconds. 4. Slowly lower your leg to the starting position. 5. Let your muscles relax completely after each repetition. Repeat __________ times. Complete this exercise __________ times a day. This information is not intended to  replace advice given to you by your health care provider. Make sure you discuss any questions you have with your health care provider. Document Revised: 01/03/2019 Document Reviewed: 07/05/2018 Elsevier Patient Education  [place].

## 2020-06-22 ENCOUNTER — Telehealth: Payer: Self-pay | Admitting: Cardiology

## 2020-06-22 NOTE — Telephone Encounter (Signed)
Patient states at her last appointment with Dr. Radford Pax they talked about getting her a new machine, but she states she has not heard anything about it.

## 2020-06-22 NOTE — Telephone Encounter (Signed)
Reached out to [facility] spoke to [employer] to inquire about patients cpap machine order sent 11/29/19 and found out the order was entered wrong in their system for just supplies. Delana Meyer will work on sending another CMN on 06/23/20 to be signed so we can get the patients unit coming to her. Patient has been notified.

## 2020-06-23 LAB — CYTOLOGY - PAP
Comment: NEGATIVE
Diagnosis: NEGATIVE
High risk HPV: NEGATIVE

## 2020-06-23 NOTE — Telephone Encounter (Signed)
CMN received and signed by ([facility] -Dr Johnsie Cancel) and faxed back to [place] att: [place].

## 2020-06-29 NOTE — Telephone Encounter (Signed)
Regina Dunlap CONFIRMED cmn RECO

## 2020-07-07 ENCOUNTER — Telehealth (INDEPENDENT_AMBULATORY_CARE_PROVIDER_SITE_OTHER): Payer: Self-pay | Admitting: Gastroenterology

## 2020-07-07 DIAGNOSIS — Z1211 Encounter for screening for malignant neoplasm of colon: Secondary | ICD-10-CM

## 2020-07-07 MED ORDER — PEG 3350-KCL-NA BICARB-NACL 420 G PO SOLR: 4000.0000 mL | Freq: Once | ORAL | 0 refills | Status: AC

## 2020-07-07 NOTE — Progress Notes (Signed)
Gastroenterology Pre-Procedure Review  Request Date: Friday 08/07/20 Requesting Physician: Dr. Allen Norris  PATIENT REVIEW QUESTIONS: The patient responded to the following health history questions as indicated:    1. Are you having any GI issues? no 2. Do you have a personal history of Polyps? no 3. Do you have a family history of Colon Cancer or Polyps? yes (sister had stage 4 colon cancer, and Celiac's disesase) 4. Diabetes Mellitus? no 5. Joint replacements in the past 12 months?no 6. Major health problems in the past 3 months?no 7. Any artificial heart valves, MVP, or defibrillator?no    MEDICATIONS & ALLERGIES:    Patient reports the following regarding taking any anticoagulation/antiplatelet therapy:   Plavix, Coumadin, Eliquis, Xarelto, Lovenox, Pradaxa, Brilinta, or Effient? no Aspirin? yes (81 mg daily)  Patient confirms/reports the following medications:  Current Outpatient Medications  Medication Sig Dispense Refill   Ascorbic Acid (VITAMIN C) 100 MG tablet Take 500 mg by mouth daily.     Ascorbic Acid (VITAMIN C) 100 MG tablet Take 100 mg by mouth daily.     aspirin 81 MG tablet Take 81 mg by mouth daily.     chlorthalidone (HYGROTON) 25 MG tablet TAKE 1 TABLET(25 MG) BY MOUTH DAILY 90 tablet 3   cholecalciferol (VITAMIN D3) 25 MCG (1000 UNIT) tablet Take 5,000 Units by mouth daily.     famotidine (PEPCID) 20 MG tablet TAKE 1 TABLET BY MOUTH TWICE DAILY AS NEEDED FOR HEARTBURN OR INDIGESTION 180 tablet 1   fluticasone (FLONASE) 50 MCG/ACT nasal spray Place 2 sprays into both nostrils daily. (Patient taking differently: Place 2 sprays into both nostrils daily as needed. ) 16 g 6   Krill Oil 1000 MG CAPS Take 1 capsule daily by mouth.      loratadine (CLARITIN) 10 MG tablet Take 1 tablet (10 mg total) by mouth daily as needed for allergies. 30 tablet 11   losartan (COZAAR) 100 MG tablet Take 1 tablet (100 mg total) by mouth daily. 90 tablet 3   Magnesium 100 MG CAPS  Take 1,000 mg by mouth.     simvastatin (ZOCOR) 20 MG tablet Take 1 tablet (20 mg total) by mouth at bedtime. 90 tablet 3   TURMERIC PO Take by mouth daily. Reported on 03/28/2016     vitamin B-12 (CYANOCOBALAMIN) 100 MCG tablet Take 100 mcg by mouth daily.     polyethylene glycol-electrolytes (NULYTELY) 420 g solution Take 4,000 mLs by mouth once for 1 dose. 4000 mL 0   potassium chloride (KLOR-CON) 10 MEQ tablet Take 1 tablet twice a week (Patient not taking: Reported on 07/07/2020) 24 tablet 1   No current facility-administered medications for this visit.    Patient confirms/reports the following allergies:  Allergies  Allergen Reactions   Ciprofloxacin    Lisinopril    Migraine Formula [Aspirin-Acetaminophen-Caffeine]     Midrina; broke out in rash    Povidone Iodine Rash    No orders of the defined types were placed in this encounter.   AUTHORIZATION INFORMATION Primary Insurance: 1D#: Group #:  Secondary Insurance: 1D#: Group #:  SCHEDULE INFORMATION: Date: Friday 08/07/20 Time: Location:[facility]

## 2020-07-08 DIAGNOSIS — G4733 Obstructive sleep apnea (adult) (pediatric): Secondary | ICD-10-CM | POA: Diagnosis not present

## 2020-07-15 ENCOUNTER — Other Ambulatory Visit: Payer: Self-pay

## 2020-07-15 ENCOUNTER — Ambulatory Visit
Admission: RE | Admit: 2020-07-15 | Discharge: 2020-07-15 | Disposition: A | Discharge status: Home / Self Care | Payer: BC Managed Care – PPO | Source: Ambulatory Visit | Attending: Family Medicine | Admitting: Family Medicine

## 2020-07-15 DIAGNOSIS — Z1231 Encounter for screening mammogram for malignant neoplasm of breast: Secondary | ICD-10-CM | POA: Insufficient documentation

## 2020-08-03 ENCOUNTER — Other Ambulatory Visit: Payer: Self-pay

## 2020-08-03 ENCOUNTER — Encounter: Payer: Self-pay | Admitting: Gastroenterology

## 2020-08-05 ENCOUNTER — Other Ambulatory Visit: Payer: Self-pay

## 2020-08-05 ENCOUNTER — Other Ambulatory Visit
Admission: RE | Admit: 2020-08-05 | Discharge: 2020-08-05 | Disposition: A | Discharge status: Home / Self Care | Payer: BC Managed Care – PPO | Source: Ambulatory Visit | Attending: Gastroenterology | Admitting: Gastroenterology

## 2020-08-05 DIAGNOSIS — Z7982 Long term (current) use of aspirin: Secondary | ICD-10-CM | POA: Diagnosis not present

## 2020-08-05 DIAGNOSIS — Z01812 Encounter for preprocedural laboratory examination: Secondary | ICD-10-CM | POA: Insufficient documentation

## 2020-08-05 DIAGNOSIS — Z881 Allergy status to other antibiotic agents status: Secondary | ICD-10-CM | POA: Diagnosis not present

## 2020-08-05 DIAGNOSIS — Z20822 Contact with and (suspected) exposure to covid-19: Secondary | ICD-10-CM | POA: Insufficient documentation

## 2020-08-05 DIAGNOSIS — Z1211 Encounter for screening for malignant neoplasm of colon: Secondary | ICD-10-CM | POA: Diagnosis not present

## 2020-08-05 DIAGNOSIS — K648 Other hemorrhoids: Secondary | ICD-10-CM | POA: Diagnosis not present

## 2020-08-05 DIAGNOSIS — Z800 Family history of malignant neoplasm of digestive organs: Secondary | ICD-10-CM | POA: Diagnosis not present

## 2020-08-05 DIAGNOSIS — Z0000 Encounter for general adult medical examination without abnormal findings: Secondary | ICD-10-CM | POA: Diagnosis not present

## 2020-08-05 DIAGNOSIS — Z888 Allergy status to other drugs, medicaments and biological substances status: Secondary | ICD-10-CM | POA: Diagnosis not present

## 2020-08-06 LAB — COMPLETE METABOLIC PANEL WITH GFR
AG Ratio: 1.5 (calc) (ref 1.0–2.5)
ALT: 17 U/L (ref 6–29)
AST: 18 U/L (ref 10–35)
Albumin: 4.1 g/dL (ref 3.6–5.1)
Alkaline phosphatase (APISO): 88 U/L (ref 37–153)
BUN: 20 mg/dL (ref 7–25)
CO2: 30 mmol/L (ref 20–32)
Calcium: 9.6 mg/dL (ref 8.6–10.4)
Chloride: 102 mmol/L (ref 98–110)
Creat: 0.89 mg/dL (ref 0.50–1.05)
GFR, Est African American: 83 mL/min/{1.73_m2} (ref >=60)
GFR, Est Non African American: 72 mL/min/{1.73_m2} (ref >=60)
Globulin: 2.7 g/dL (calc) (ref 1.9–3.7)
Glucose, Bld: 102 mg/dL — ABNORMAL HIGH (ref 65–99)
Potassium: 3.2 mmol/L — ABNORMAL LOW (ref 3.5–5.3)
Sodium: 140 mmol/L (ref 135–146)
Total Bilirubin: 0.6 mg/dL (ref 0.2–1.2)
Total Protein: 6.8 g/dL (ref 6.1–8.1)

## 2020-08-06 LAB — CBC WITH DIFFERENTIAL/PLATELET
Absolute Monocytes: 248 cells/uL (ref 200–950)
Basophils Absolute: 38 cells/uL (ref 0–200)
Basophils Relative: 0.7 %
Eosinophils Absolute: 211 cells/uL (ref 15–500)
Eosinophils Relative: 3.9 %
HCT: 41 % (ref 35.0–45.0)
Hemoglobin: 13.7 g/dL (ref 11.7–15.5)
Lymphs Abs: 1863 cells/uL (ref 850–3900)
MCH: 29 pg (ref 27.0–33.0)
MCHC: 33.4 g/dL (ref 32.0–36.0)
MCV: 86.9 fL (ref 80.0–100.0)
MPV: 10.7 fL (ref 7.5–12.5)
Monocytes Relative: 4.6 %
Neutro Abs: 3040 cells/uL (ref 1500–7800)
Neutrophils Relative %: 56.3 %
Platelets: 249 10*3/uL (ref 140–400)
RBC: 4.72 10*6/uL (ref 3.80–5.10)
RDW: 12.8 % (ref 11.0–15.0)
Total Lymphocyte: 34.5 %
WBC: 5.4 10*3/uL (ref 3.8–10.8)

## 2020-08-06 LAB — LIPID PANEL
Cholesterol: 174 mg/dL (ref <200)
HDL: 51 mg/dL (ref >=50)
LDL Cholesterol (Calc): 93 mg/dL (calc)
Non-HDL Cholesterol (Calc): 123 mg/dL (calc) (ref <130)
Total CHOL/HDL Ratio: 3.4 (calc) (ref <5.0)
Triglycerides: 198 mg/dL — ABNORMAL HIGH (ref <150)

## 2020-08-06 LAB — HEMOGLOBIN A1C
Hgb A1c MFr Bld: 6.2 % of total Hgb — ABNORMAL HIGH (ref <5.7)
Mean Plasma Glucose: 131 (calc)
eAG (mmol/L): 7.3 (calc)

## 2020-08-06 LAB — SARS CORONAVIRUS 2 (TAT 6-24 HRS): SARS Coronavirus 2: NEGATIVE

## 2020-08-07 ENCOUNTER — Ambulatory Visit: Payer: BC Managed Care – PPO | Admitting: Anesthesiology

## 2020-08-07 ENCOUNTER — Other Ambulatory Visit: Payer: Self-pay

## 2020-08-07 ENCOUNTER — Encounter: Payer: Self-pay | Admitting: Gastroenterology

## 2020-08-07 ENCOUNTER — Ambulatory Visit
Admission: RE | Admit: 2020-08-07 | Discharge: 2020-08-07 | Disposition: A | Discharge status: Home / Self Care | Payer: BC Managed Care – PPO | Attending: Gastroenterology | Admitting: Gastroenterology

## 2020-08-07 ENCOUNTER — Encounter
Admission: RE | Disposition: A | Discharge status: Home / Self Care | Payer: Self-pay | Source: Home / Self Care | Attending: Gastroenterology

## 2020-08-07 DIAGNOSIS — Z7982 Long term (current) use of aspirin: Secondary | ICD-10-CM | POA: Insufficient documentation

## 2020-08-07 DIAGNOSIS — Z20822 Contact with and (suspected) exposure to covid-19: Secondary | ICD-10-CM | POA: Insufficient documentation

## 2020-08-07 DIAGNOSIS — Z881 Allergy status to other antibiotic agents status: Secondary | ICD-10-CM | POA: Diagnosis not present

## 2020-08-07 DIAGNOSIS — K648 Other hemorrhoids: Secondary | ICD-10-CM | POA: Insufficient documentation

## 2020-08-07 DIAGNOSIS — Z888 Allergy status to other drugs, medicaments and biological substances status: Secondary | ICD-10-CM | POA: Insufficient documentation

## 2020-08-07 DIAGNOSIS — Z1211 Encounter for screening for malignant neoplasm of colon: Secondary | ICD-10-CM | POA: Insufficient documentation

## 2020-08-07 DIAGNOSIS — Z800 Family history of malignant neoplasm of digestive organs: Secondary | ICD-10-CM | POA: Insufficient documentation

## 2020-08-07 HISTORY — PX: COLONOSCOPY WITH PROPOFOL: SHX5780

## 2020-08-07 SURGERY — COLONOSCOPY WITH PROPOFOL
Anesthesia: General | Site: Rectum

## 2020-08-07 MED ORDER — LIDOCAINE HCL (CARDIAC) PF 100 MG/5ML IV SOSY
PREFILLED_SYRINGE | INTRAVENOUS | Status: DC | PRN
  Administered 2020-08-07: 50 mg via INTRAVENOUS

## 2020-08-07 MED ORDER — PROPOFOL 10 MG/ML IV BOLUS
INTRAVENOUS | Status: DC | PRN
  Administered 2020-08-07: 20 mg via INTRAVENOUS
  Administered 2020-08-07: 30 mg via INTRAVENOUS
  Administered 2020-08-07: 70 mg via INTRAVENOUS
  Administered 2020-08-07 (×2): 30 mg via INTRAVENOUS
  Administered 2020-08-07: 20 mg via INTRAVENOUS

## 2020-08-07 MED ORDER — SODIUM CHLORIDE 0.9 % IV SOLN: INTRAVENOUS | Status: DC

## 2020-08-07 MED ORDER — STERILE WATER FOR IRRIGATION IR SOLN: Status: DC | PRN

## 2020-08-07 MED ORDER — LACTATED RINGERS IV SOLN: INTRAVENOUS | Status: DC | PRN

## 2020-08-07 SURGICAL SUPPLY — 6 items
GOWN CVR UNV OPN BCK APRN NK (MISCELLANEOUS) ×2 IMPLANT
GOWN ISOL THUMB LOOP REG UNIV (MISCELLANEOUS) ×6
KIT PRC NS LF DISP ENDO (KITS) ×1 IMPLANT
KIT PROCEDURE OLYMPUS (KITS) ×3
MANIFOLD NEPTUNE II (INSTRUMENTS) ×3 IMPLANT
WATER STERILE IRR 250ML POUR (IV SOLUTION) ×3 IMPLANT

## 2020-08-07 NOTE — Transfer of Care (Addendum)
Immediate Anesthesia Transfer of Care Note  Patient: Regina Dunlap  Procedure(s) Performed: COLONOSCOPY WITH PROPOFOL (N/A Rectum)  Patient Location: PACU  Anesthesia Type: General  Level of Consciousness: awake, alert  and patient cooperative  Airway and Oxygen Therapy: Patient Spontanous Breathing and Patient connected to supplemental oxygen  Post-op Assessment: Post-op Vital signs reviewed, Patient's Cardiovascular Status Stable, Respiratory Function Stable, Patent Airway and No signs of Nausea or vomiting  Post-op Vital Signs: Reviewed and stable  Complications: No complications documented.

## 2020-08-07 NOTE — Op Note (Signed)
[facility] Gastroenterology Patient Name: Regina Dunlap Procedure Date: 08/07/2020 10:53 AM MRN: [id number] Account #: [id number] Date of Birth: Jul 27, 1961 Admit Type: Outpatient Age: 59 Room: [facility] OR ROOM 01 Gender: Female Note Status: Finalized Procedure:             Colonoscopy Indications:           Screening for colorectal malignant neoplasm, Screening                         in patient at increased risk: Family history of                         1st-degree relative with colorectal cancer before age                         61 years Providers:             Lucilla Lame MD, MD Referring MD:          Mertie Clause. Fletcher Anon, MD (Referring MD) Medicines:             Propofol per Anesthesia Complications:         No immediate complications. Procedure:             Pre-Anesthesia Assessment:                        - Prior to the procedure, a History and Physical was                         performed, and patient medications and allergies were                         reviewed. The patient's tolerance of previous                         anesthesia was also reviewed. The risks and benefits                         of the procedure and the sedation options and risks                         were discussed with the patient. All questions were                         answered, and informed consent was obtained. Prior                         Anticoagulants: The patient has taken no previous                         anticoagulant or antiplatelet agents. ASA Grade                         Assessment: II - A patient with mild systemic disease.                         After reviewing the risks and benefits, the patient  was deemed in satisfactory condition to undergo the                         procedure.                        After obtaining informed consent, the colonoscope was                         passed under direct vision. Throughout the procedure,                          the patient's blood pressure, pulse, and oxygen                         saturations were monitored continuously. The was                         introduced through the anus and advanced to the the                         cecum, identified by appendiceal orifice and ileocecal                         valve. The colonoscopy was performed without                         difficulty. The patient tolerated the procedure well.                         The quality of the bowel preparation was excellent. Findings:      The perianal and digital rectal examinations were normal.      Non-bleeding internal hemorrhoids were found during retroflexion. The       hemorrhoids were Grade I (internal hemorrhoids that do not prolapse). Impression:            - Non-bleeding internal hemorrhoids.                        - No specimens collected. Recommendation:        - Discharge patient to home.                        - Resume previous diet.                        - Continue present medications.                        - Repeat colonoscopy in 5 years for surveillance. Procedure Code(s):     --- Professional ---                        [id number], Colonoscopy, flexible; diagnostic, including                         collection of specimen(s) by brushing or washing, when                         performed (separate procedure) Diagnosis Code(s):     ---  Professional ---                        Z12.11, Encounter for screening for malignant neoplasm                         of colon CPT copyright 2019 American Medical Association. All rights reserved. The codes documented in this report are preliminary and upon coder review may  be revised to meet current compliance requirements. Lucilla Lame MD, MD 08/07/2020 11:16:47 AM This report has been signed electronically. Number of Addenda: 0 Note Initiated On: 08/07/2020 10:53 AM Scope Withdrawal Time: 0 hours 8 minutes 1 second  Total Procedure Duration: 0 hours  10 minutes 22 seconds  Estimated Blood Loss:  Estimated blood loss: none.      [facility]

## 2020-08-07 NOTE — Discharge Instructions (Signed)
General Anesthesia, Adult, Care After This sheet gives you information about how to care for yourself after your procedure. Your health care provider may also give you more specific instructions. If you have problems or questions, contact your health care provider. What can I expect after the procedure? After the procedure, the following side effects are common:  Pain or discomfort at the IV site.  Nausea.  Vomiting.  Sore throat.  Trouble concentrating.  Feeling cold or chills.  Weak or tired.  Sleepiness and fatigue.  Soreness and body aches. These side effects can affect parts of the body that were not involved in surgery. Follow these instructions at home:  For at least 24 hours after the procedure:  Have a responsible adult stay with you. It is important to have someone help care for you until you are awake and alert.  Rest as needed.  Do not: ? Participate in activities in which you could fall or become injured. ? Drive. ? Use heavy machinery. ? Drink alcohol. ? Take sleeping pills or medicines that cause drowsiness. ? Make important decisions or sign legal documents. ? Take care of children on your own. Eating and drinking  Follow any instructions from your health care provider about eating or drinking restrictions.  When you feel hungry, start by eating small amounts of foods that are soft and easy to digest (bland), such as toast. Gradually return to your regular diet.  Drink enough fluid to keep your urine pale yellow.  If you vomit, rehydrate by drinking water, juice, or clear broth. General instructions  If you have sleep apnea, surgery and certain medicines can increase your risk for breathing problems. Follow instructions from your health care provider about wearing your sleep device: ? Anytime you are sleeping, including during daytime naps. ? While taking prescription pain medicines, sleeping medicines, or medicines that make you drowsy.  Return to  your normal activities as told by your health care provider. Ask your health care provider what activities are safe for you.  Take over-the-counter and prescription medicines only as told by your health care provider.  If you smoke, do not smoke without supervision.  Keep all follow-up visits as told by your health care provider. This is important. Contact a health care provider if:  You have nausea or vomiting that does not get better with medicine.  You cannot eat or drink without vomiting.  You have pain that does not get better with medicine.  You are unable to pass urine.  You develop a skin rash.  You have a fever.  You have redness around your IV site that gets worse. Get help right away if:  You have difficulty breathing.  You have chest pain.  You have blood in your urine or stool, or you vomit blood. Summary  After the procedure, it is common to have a sore throat or nausea. It is also common to feel tired.  Have a responsible adult stay with you for the first 24 hours after general anesthesia. It is important to have someone help care for you until you are awake and alert.  When you feel hungry, start by eating small amounts of foods that are soft and easy to digest (bland), such as toast. Gradually return to your regular diet.  Drink enough fluid to keep your urine pale yellow.  Return to your normal activities as told by your health care provider. Ask your health care provider what activities are safe for you. This information is not   intended to replace advice given to you by your health care provider. Make sure you discuss any questions you have with your health care provider. Document Revised: 09/15/2017 Document Reviewed: 04/28/2017 Elsevier Patient Education  2020 [employer]  

## 2020-08-07 NOTE — Anesthesia Preprocedure Evaluation (Signed)
Anesthesia Evaluation  Patient identified by MRN, date of birth, ID band  History of Anesthesia Complications Negative for: history of anesthetic complications  Airway Mallampati: I  TM Distance: >3 FB Neck ROM: Full    Dental   Pulmonary sleep apnea ,    Pulmonary exam normal        Cardiovascular Exercise Tolerance: Good hypertension, Normal cardiovascular exam     Neuro/Psych    GI/Hepatic GERD  ,  Endo/Other    Renal/GU      Musculoskeletal   Abdominal   Peds  Hematology   Anesthesia Other Findings   Reproductive/Obstetrics                             Anesthesia Physical Anesthesia Plan  ASA: II  Anesthesia Plan: General   Post-op Pain Management:    Induction: Intravenous  PONV Risk Score and Plan:   Airway Management Planned: Natural Airway  Additional Equipment:   Intra-op Plan:   Post-operative Plan:   Informed Consent: I have reviewed the patients History and Physical, chart, labs and discussed the procedure including the risks, benefits and alternatives for the proposed anesthesia with the patient or authorized representative who has indicated his/her understanding and acceptance.       Plan Discussed with: CRNA, Anesthesiologist and Surgeon  Anesthesia Plan Comments:         Anesthesia Quick Evaluation

## 2020-08-07 NOTE — Anesthesia Procedure Notes (Signed)
Performed by: Kaylin Marcon, CRNA Pre-anesthesia Checklist: Patient identified, Emergency Drugs available, Suction available, Timeout performed and Patient being monitored Patient Re-evaluated:Patient Re-evaluated prior to induction Oxygen Delivery Method: Nasal cannula Placement Confirmation: positive ETCO2       

## 2020-08-07 NOTE — Anesthesia Postprocedure Evaluation (Signed)
Anesthesia Post Note  Patient: Regina Dunlap  Procedure(s) Performed: COLONOSCOPY WITH PROPOFOL (N/A Rectum)     Patient location during evaluation: PACU Anesthesia Type: General Level of consciousness: awake and alert Pain management: pain level controlled Vital Signs Assessment: post-procedure vital signs reviewed and stable Respiratory status: spontaneous breathing, nonlabored ventilation, respiratory function stable and patient connected to nasal cannula oxygen Cardiovascular status: stable and blood pressure returned to baseline Postop Assessment: no apparent nausea or vomiting Anesthetic complications: no   No complications documented.  Wanda Plump Maximiliano Cromartie

## 2020-08-07 NOTE — H&P (Signed)
Lucilla Lame, MD [address], [place] [address] Phone:[phone] Fax : [phone]  Primary Care Physician:  Delsa Grana, PA-C Primary Gastroenterologist:  Dr. Allen Norris  Pre-Procedure History & Physical: HPI:  Regina Dunlap is a 59 y.o. female is here for an colonoscopy.   Past Medical History:  Diagnosis Date  . Esophageal reflux   . Fatty liver 11/04/2016  . Mastodynia   . Other and unspecified hyperlipidemia   . Overweight (BMI 25.0-29.9) 11/04/2016  . Pain in thoracic spine   . Prediabetes 03/28/2016  . Unspecified essential hypertension   . Unspecified sleep apnea    CPAP    Past Surgical History:  Procedure Laterality Date  . BREAST BIOPSY Right 1999   neg  . BREAST CYST ASPIRATION Right    neg  . TUBAL LIGATION      Prior to Admission medications   Medication Sig Start Date End Date Taking? Authorizing Provider  Ascorbic Acid (VITAMIN C) 100 MG tablet Take 500 mg by mouth daily.   Yes [provider]  aspirin 81 MG tablet Take 81 mg by mouth daily.   Yes [provider]  chlorthalidone (HYGROTON) 25 MG tablet TAKE 1 TABLET(25 MG) BY MOUTH DAILY 06/16/20  Yes Delsa Grana, PA-C  cholecalciferol (VITAMIN D3) 25 MCG (1000 UNIT) tablet Take 5,000 Units by mouth daily.   Yes [provider]  famotidine (PEPCID) 20 MG tablet TAKE 1 TABLET BY MOUTH TWICE DAILY AS NEEDED FOR HEARTBURN OR INDIGESTION 12/10/19  Yes Hubbard Hartshorn, FNP  fluticasone (FLONASE) 50 MCG/ACT nasal spray Place 2 sprays into both nostrils daily. Patient taking differently: Place 2 sprays into both nostrils daily as needed.  09/01/19  Yes Hubbard Hartshorn, FNP  Krill Oil 1000 MG CAPS Take 1 capsule daily by mouth.    Yes [provider]  loratadine (CLARITIN) 10 MG tablet Take 1 tablet (10 mg total) by mouth daily as needed for allergies. 02/11/20  Yes Delsa Grana, PA-C  losartan (COZAAR) 100 MG tablet Take 1 tablet (100 mg total) by mouth  daily. 06/19/20  Yes Delsa Grana, PA-C  Magnesium 100 MG CAPS Take 1,000 mg by mouth.   Yes [provider]  potassium chloride (KLOR-CON) 10 MEQ tablet Take 1 tablet twice a week 10/15/19  Yes Hubbard Hartshorn, FNP  simvastatin (ZOCOR) 20 MG tablet Take 1 tablet (20 mg total) by mouth at bedtime. 05/01/20  Yes Delsa Grana, PA-C  TURMERIC PO Take by mouth daily. Reported on 03/28/2016   Yes [provider]  vitamin B-12 (CYANOCOBALAMIN) 100 MCG tablet Take 100 mcg by mouth daily.   Yes [provider]    Allergies as of 07/07/2020 - Review Complete 07/07/2020  Allergen Reaction Noted  . Ciprofloxacin  05/19/2014  . Lisinopril  05/19/2014  . Migraine formula [aspirin-acetaminophen-caffeine]  05/19/2014  . Povidone iodine Rash     Family History  Problem Relation Age of Onset  . Migraines Mother   . Stroke Father   . Hypertension Father   . Hypertension Sister   . Anuerysm Brother   . Hypertension Sister   . Cancer Sister        cervical  . Cancer Sister        colon  . Breast cancer Neg Hx     Social History   Socioeconomic History  . Marital status: Married    Spouse name: Not on file  . Number of children: Not on file  .  Years of education: Not on file  . Highest education level: Not on file  Occupational History  . Not on file  Tobacco Use  . Smoking status: Never Smoker  . Smokeless tobacco: Never Used  Vaping Use  . Vaping Use: Never used  Substance and Sexual Activity  . Alcohol use: Yes    Comment: social  . Drug use: No  . Sexual activity: Yes  Other Topics Concern  . Not on file  Social History Narrative  . Not on file   Social Determinants of Health   Financial Resource Strain: Low Risk   . Difficulty of Paying Living Expenses: Not hard at all  Food Insecurity: No Food Insecurity  . Worried About Charity fundraiser in the Last Year: Never true  . Ran Out of Food in the Last Year: Never true  Transportation Needs: No  Transportation Needs  . Lack of Transportation (Medical): No  . Lack of Transportation (Non-Medical): No  Physical Activity: Insufficiently Active  . Days of Exercise per Week: 3 days  . Minutes of Exercise per Session: 30 min  Stress: No Stress Concern Present  . Feeling of Stress : Only a little  Social Connections: Moderately Isolated  . Frequency of Communication with Friends and Family: More than three times a week  . Frequency of Social Gatherings with Friends and Family: Never  . Attends Religious Services: Never  . Active Member of Clubs or Organizations: No  . Attends Archivist Meetings: Never  . Marital Status: Married  Human resources officer Violence: Not At Risk  . Fear of Current or Ex-Partner: No  . Emotionally Abused: No  . Physically Abused: No  . Sexually Abused: No    Review of Systems: See HPI, otherwise negative ROS  Physical Exam: BP (!) 141/82   Pulse 66   Temp 97.8 F (36.6 C) (Temporal)   Ht 5\' 2"  (1.575 m)   Wt 78 kg   SpO2 100%   BMI 31.46 kg/m  General:   Alert,  pleasant and cooperative in NAD Head:  Normocephalic and atraumatic. Neck:  Supple; no masses or thyromegaly. Lungs:  Clear throughout to auscultation.    Heart:  Regular rate and rhythm. Abdomen:  Soft, nontender and nondistended. Normal bowel sounds, without guarding, and without rebound.   Neurologic:  Alert and  oriented x4;  grossly normal neurologically.  Impression/Plan: Regina Dunlap is here for an colonoscopy to be performed for screening with a sister with colon cancer  Risks, benefits, limitations, and alternatives regarding  colonoscopy have been reviewed with the patient.  Questions have been answered.  All parties agreeable.   Lucilla Lame, MD  08/07/2020, 10:25 AM

## 2020-08-08 DIAGNOSIS — G4733 Obstructive sleep apnea (adult) (pediatric): Secondary | ICD-10-CM | POA: Diagnosis not present

## 2020-08-10 ENCOUNTER — Other Ambulatory Visit: Payer: Self-pay | Admitting: Family Medicine

## 2020-08-10 DIAGNOSIS — E876 Hypokalemia: Secondary | ICD-10-CM

## 2020-08-10 DIAGNOSIS — I10 Essential (primary) hypertension: Secondary | ICD-10-CM

## 2020-08-10 MED ORDER — POTASSIUM CHLORIDE ER 10 MEQ PO TBCR: EXTENDED_RELEASE_TABLET | ORAL | 1 refills | Status: DC

## 2020-08-25 ENCOUNTER — Ambulatory Visit
Admission: RE | Admit: 2020-08-25 | Discharge: 2020-08-25 | Disposition: A | Discharge status: Home / Self Care | Payer: BC Managed Care – PPO | Source: Ambulatory Visit | Attending: Family Medicine | Admitting: Family Medicine

## 2020-08-25 ENCOUNTER — Ambulatory Visit
Admission: RE | Admit: 2020-08-25 | Discharge: 2020-08-25 | Disposition: A | Discharge status: Home / Self Care | Payer: BC Managed Care – PPO | Attending: Family Medicine | Admitting: Family Medicine

## 2020-08-25 ENCOUNTER — Encounter: Payer: Self-pay | Admitting: Family Medicine

## 2020-08-25 ENCOUNTER — Other Ambulatory Visit: Payer: Self-pay

## 2020-08-25 ENCOUNTER — Ambulatory Visit (INDEPENDENT_AMBULATORY_CARE_PROVIDER_SITE_OTHER): Payer: BC Managed Care – PPO | Admitting: Family Medicine

## 2020-08-25 VITALS — BP 110/72 | HR 69 | Temp 98.2°F | Resp 16 | Ht 62.0 in | Wt 173.0 lb

## 2020-08-25 DIAGNOSIS — M5431 Sciatica, right side: Secondary | ICD-10-CM

## 2020-08-25 DIAGNOSIS — M79604 Pain in right leg: Secondary | ICD-10-CM | POA: Diagnosis not present

## 2020-08-25 DIAGNOSIS — M25551 Pain in right hip: Secondary | ICD-10-CM

## 2020-08-25 DIAGNOSIS — E876 Hypokalemia: Secondary | ICD-10-CM | POA: Diagnosis not present

## 2020-08-25 DIAGNOSIS — I10 Essential (primary) hypertension: Secondary | ICD-10-CM

## 2020-08-25 DIAGNOSIS — M545 Low back pain, unspecified: Secondary | ICD-10-CM | POA: Diagnosis not present

## 2020-08-25 NOTE — Progress Notes (Signed)
Patient ID: Regina Dunlap, female    DOB: 04/19/1961, 59 y.o.   MRN: [id number]  PCP: Delsa Grana, PA-C  Chief Complaint  Patient presents with  . Follow-up    potassium low    Subjective:   Regina Dunlap is a 59 y.o. female, presents to clinic with CC of the following:  HPI     Review labs - potassium low  She did take the potassium supplement and level be redone today, she has no symptoms or concerns  Working on diet, cutting back on carbs and doing well junk food  Did colonoscopy with Dr. Allen Norris - sister with colon ca  Recurrent right hip/back pain radiates down right leg, she tried doing piriformis stretches which she was given a handout on after her last visit which was her physical and the symptoms were briefly mentioned at the end of the visit.  She has had this off and on for several months does seem to go down the outside and back of her right hip sometimes radiates all the way down to her foot, worse with sitting sometimes worse with walking as well, comes and goes, she denies any numbness, weakness, urinary retention or incontinence No past x-rays or injuries    Patient Active Problem List   Diagnosis Date Noted  . Vitamin D deficiency 05/01/2020  . Hot flashes 07/15/2019  . Varicose veins of leg with pain, left 04/23/2019  . Hypokalemia 11/23/2017  . Postmenopausal bleeding 10/17/2017  . Varicose veins of both lower extremities without ulcer or inflammation 04/27/2017  . Sleep apnea 04/24/2017  . Encounter for screening colonoscopy 04/24/2017  . Obesity (BMI 30.0-34.9) 11/04/2016  . Fatty liver 11/04/2016  . History of concussion 06/07/2016  . MCI (mild cognitive impairment) 06/07/2016  . Medication monitoring encounter 03/28/2016  . Prediabetes 03/28/2016  . Memory impairment 03/28/2016  . Decreased sense of smell 03/28/2016  . Hypercholesterolemia with hypertriglyceridemia 10/13/2015  . Encounter for screening mammogram for malignant  neoplasm of breast 10/13/2015  . GERD without esophagitis 10/13/2015  . Screening for STD (sexually transmitted disease) 10/13/2015  . Allergic rhinitis 09/16/2015  . Lipoma of arm 09/16/2015  . Family history of colon cancer 09/16/2015  . Shortness of breath 05/27/2014  . Hypertension goal BP (blood pressure) < 140/90       Current Outpatient Medications:  .  Ascorbic Acid (VITAMIN C) 100 MG tablet, Take 500 mg by mouth daily., Disp: , Rfl:  .  aspirin 81 MG tablet, Take 81 mg by mouth daily., Disp: , Rfl:  .  chlorthalidone (HYGROTON) 25 MG tablet, TAKE 1 TABLET(25 MG) BY MOUTH DAILY, Disp: 90 tablet, Rfl: 3 .  cholecalciferol (VITAMIN D3) 25 MCG (1000 UNIT) tablet, Take 5,000 Units by mouth daily., Disp: , Rfl:  .  famotidine (PEPCID) 20 MG tablet, TAKE 1 TABLET BY MOUTH TWICE DAILY AS NEEDED FOR HEARTBURN OR INDIGESTION, Disp: 180 tablet, Rfl: 1 .  Flaxseed Oil OIL, by Does not apply route., Disp: , Rfl:  .  fluticasone (FLONASE) 50 MCG/ACT nasal spray, Place 2 sprays into both nostrils daily. (Patient taking differently: Place 2 sprays into both nostrils daily as needed. ), Disp: 16 g, Rfl: 6 .  Krill Oil 1000 MG CAPS, Take 1 capsule daily by mouth. , Disp: , Rfl:  .  loratadine (CLARITIN) 10 MG tablet, Take 1 tablet (10 mg total) by mouth daily as needed for allergies., Disp: 30 tablet, Rfl: 11 .  losartan (COZAAR) 100 MG  tablet, Take 1 tablet (100 mg total) by mouth daily., Disp: 90 tablet, Rfl: 3 .  Magnesium 100 MG CAPS, Take 1,000 mg by mouth., Disp: , Rfl:  .  Multiple Vitamins-Minerals (HAIR/SKIN/NAILS/BIOTIN) TABS, Take by mouth., Disp: , Rfl:  .  potassium chloride (KLOR-CON) 10 MEQ tablet, Take 1 tablet BID x 3 d then take 1 tablet daily, Disp: 96 tablet, Rfl: 1 .  simvastatin (ZOCOR) 20 MG tablet, Take 1 tablet (20 mg total) by mouth at bedtime., Disp: 90 tablet, Rfl: 3 .  TURMERIC PO, Take by mouth daily. Reported on 03/28/2016, Disp: , Rfl:  .  vitamin B-12  (CYANOCOBALAMIN) 100 MCG tablet, Take 100 mcg by mouth daily., Disp: , Rfl:    Allergies  Allergen Reactions  . Ciprofloxacin Diarrhea and Nausea And Vomiting  . Lisinopril Cough  . Migraine Formula [Aspirin-Acetaminophen-Caffeine] Rash    Midrina; broke out in rash   . Povidone Iodine Rash     Social History   Tobacco Use  . Smoking status: Never Smoker  . Smokeless tobacco: Never Used  Vaping Use  . Vaping Use: Never used  Substance Use Topics  . Alcohol use: Yes    Comment: social  . Drug use: No      Chart Review Today: I personally reviewed active problem list, medication list, allergies, family history, social history, health maintenance, notes from last encounter, lab results, imaging with the patient/caregiver today.   Review of Systems  Constitutional: Negative.   HENT: Negative.   Eyes: Negative.   Respiratory: Negative.   Cardiovascular: Negative.   Gastrointestinal: Negative.   Endocrine: Negative.   Genitourinary: Negative.   Musculoskeletal: Negative.   Skin: Negative.   Allergic/Immunologic: Negative.   Neurological: Negative.   Hematological: Negative.   Psychiatric/Behavioral: Negative.   All other systems reviewed and are negative.      Objective:   Vitals:   08/25/20 0925  BP: 110/72  Pulse: 69  Resp: 16  Temp: 98.2 F (36.8 C)  TempSrc: Oral  SpO2: 99%  Weight: 173 lb (78.5 kg)  Height: 5\' 2"  (1.575 m)    Body mass index is 31.64 kg/m.  Physical Exam Vitals and nursing note reviewed.  Constitutional:      General: She is not in acute distress.    Appearance: Normal appearance. She is well-developed. She is obese. She is not ill-appearing, toxic-appearing or diaphoretic.     Interventions: Face mask in place.  HENT:     Head: Normocephalic and atraumatic.     Right Ear: External ear normal.     Left Ear: External ear normal.  Eyes:     General: Lids are normal. No scleral icterus.       Right eye: No discharge.         Left eye: No discharge.     Conjunctiva/sclera: Conjunctivae normal.  Neck:     Trachea: Phonation normal. No tracheal deviation.  Cardiovascular:     Rate and Rhythm: Normal rate and regular rhythm.     Pulses: Normal pulses.          Radial pulses are 2+ on the right side and 2+ on the left side.       Posterior tibial pulses are 2+ on the right side and 2+ on the left side.     Heart sounds: Normal heart sounds. No murmur heard.  No friction rub. No gallop.   Pulmonary:     Effort: Pulmonary effort is normal. No respiratory distress.  Breath sounds: Normal breath sounds. No stridor. No wheezing, rhonchi or rales.  Chest:     Chest wall: No tenderness.  Abdominal:     General: Bowel sounds are normal. There is no distension.     Palpations: Abdomen is soft.  Musculoskeletal:     Cervical back: Normal.     Thoracic back: Normal. No spasms, tenderness or bony tenderness. Normal range of motion.     Lumbar back: No swelling, deformity, spasms, tenderness or bony tenderness. Normal range of motion. Positive right straight leg raise test. Negative left straight leg raise test.     Right lower leg: No edema.     Left lower leg: No edema.     Comments: Grossly normal sensation bilaterally to lower extremities 5/5 Strength to bilateral lower extremities with dorsiflexion and plantarflexion Right hip normal range of motion, no crepitus palpated, no bony tenderness  Skin:    General: Skin is warm and dry.     Coloration: Skin is not jaundiced or pale.     Findings: No rash.  Neurological:     Mental Status: She is alert.     Motor: No abnormal muscle tone.     Gait: Gait normal.  Psychiatric:        Mood and Affect: Mood normal.        Speech: Speech normal.        Behavior: Behavior normal.      Results for orders placed or performed during the hospital encounter of 08/05/20  SARS CORONAVIRUS 2 (TAT 6-24 HRS) Nasopharyngeal Nasopharyngeal Swab   Specimen: Nasopharyngeal Swab    Result Value Ref Range   SARS Coronavirus 2 NEGATIVE NEGATIVE       Assessment & Plan:   1. Hypertension goal BP (blood pressure) < 140/90 Blood pressure well controlled and at goal today with low potassium I did discuss with her possibly changing her hypertension management to avoid possible side effects could discontinue chlorthalidone but continue losartan-patient does not want to make any changes right now, CMP done today, previously ordered  2. Hypokalemia Patient supplemented potassium and labs redone today  3. Right sided sciatica Some mild lumbar radiculopathy on the right, no neuro deficits or red flags, she did not have any improvement with piriformis stretches, strongly encourage patient to do and start physical therapy for management but she declined at this time.  She does want imaging, I explained to her imaging would mostly be screening x-rays and may show degenerative changes or other arthritis but would not show any nerve impingement or stenosis and if she needs his imaging in the future she would need to have started physical therapy and other management for usually more than 6 to 12 weeks in order to get insurance to approve more advanced imaging modalities Strongly encourage PT encouraged her to let me know when she is ready to do this and I will put in the referral - DG Hip Unilat W OR W/O Pelvis 2-3 Views Right; Future - DG Lumbar Spine Complete; Future  4. Right hip pain Hip exam is grossly normal -suspect the symptom of hip pain is due to sciatica - DG Hip Unilat W OR W/O Pelvis 2-3 Views Right; Future  5. Right leg pain Suspect due to sciatica - DG Hip Unilat W OR W/O Pelvis 2-3 Views Right; Future - DG Lumbar Spine Complete; Future  Encourage the patient to do PT, can manage her symptoms with Tylenol or ibuprofen if symptoms become severe explained how  we sometimes do a steroid burst to manage the inflammation, but it does not seem that she needs it at this  time  She has a follow-up appointment scheduled in March    Delsa Grana, PA-C 08/25/20 9:44 AM

## 2020-08-25 NOTE — Patient Instructions (Addendum)
Potassium Content of Foods  Potassium is a mineral found in many foods and drinks. It affects how the heart works, and helps keep fluids and minerals balanced in the body. The amount of potassium you need each day depends on your age and any medical conditions you may have. Talk to your health care provider or dietitian about how much potassium you need. The following lists of foods provide the general serving size for foods and the approximate amount of potassium in each serving, listed in milligrams (mg). Actual values may vary depending on the product and how it is processed. High in potassium The following foods and beverages have 200 mg or more of potassium per serving:  Apricots (raw) - 2 have 200 mg of potassium.  Apricots (dry) - 5 have 200 mg of potassium.  Artichoke - 1 medium has 345 mg of potassium.  Avocado -  fruit has 245 mg of potassium.  Banana - 1 medium fruit has 425 mg of potassium.  Noel or baked beans (canned) -  cup has 280 mg of potassium.  White beans (canned) -  cup has 595 mg potassium.  Beef roast - 3 oz has 320 mg of potassium.  Ground beef - 3 oz has 270 mg of potassium.  Beets (raw or cooked) -  cup has 260 mg of potassium.  Bran muffin - 2 oz has 300 mg of potassium.  Broccoli (cooked) -  cup has 230 mg of potassium.  Brussels sprouts -  cup has 250 mg of potassium.  Cantaloupe -  cup has 215 mg of potassium.  Cereal, 100% bran -  cup has 200-400 mg of potassium.  Cheeseburger -1 single fast food burger has 225-400 mg of potassium.  Chicken - 3 oz has 220 mg of potassium.  Clams (canned) - 3 oz has 535 mg of potassium.  Crab - 3 oz has 225 mg of potassium.  Dates - 5 have 270 mg of potassium.  Dried beans and peas -  cup has 300-475 mg of potassium.  Figs (dried) - 2 have 260 mg of potassium.  Fish (halibut, tuna, cod, snapper) - 3 oz has 480 mg of potassium.  Fish (salmon, haddock, swordfish, perch) - 3 oz has 300 mg of  potassium.  Fish (tuna, canned) - 3 oz has 200 mg of potassium.  [place] fries (fast food) - 3 oz has 470 mg of potassium.  Granola with fruit and nuts -  cup has 200 mg of potassium.  Grapefruit juice -  cup has 200 mg of potassium.  Honeydew melon -  cup has 200 mg of potassium.  Kale (raw) - 1 cup has 300 mg of potassium.  Kiwi - 1 medium fruit has 240 mg of potassium.  Kohlrabi, rutabaga, parsnips -  cup has 280 mg of potassium.  Lentils -  cup has 365 mg of potassium.  Mango - 1 each has 325 mg of potassium.  Milk (nonfat, low-fat, whole, buttermilk) - 1 cup has 350-380 mg of potassium.  Milk (chocolate) - 1 cup has 420 mg of potassium  Molasses - 1 Tbsp has 295 mg of potassium.  Mushrooms -  cup has 280 mg of potassium.  Nectarine - 1 each has 275 mg of potassium.  Nuts (almonds, peanuts, hazelnuts, [place], cashew, mixed) - 1 oz has 200 mg of potassium.  Nuts (pistachios) - 1 oz has 295 mg of potassium.  Orange - 1 fruit has 240 mg of potassium.  Orange juice -  cup has 235 mg of potassium.  Papaya -  medium fruit has 390 mg of potassium.  Peanut butter (chunky) - 2 Tbsp has 240 mg of potassium.  Peanut butter (smooth) - 2 Tbsp has 210 mg of potassium.  Pear - 1 medium (200 mg) of potassium.  Pomegranate - 1 whole fruit has 400 mg of potassium.  Pomegranate juice -  cup has 215 mg of potassium.  Pork - 3 oz has 350 mg of potassium.  Potato chips (salted) - 1 oz has 465 mg of potassium.  Potato (baked with skin) - 1 medium has 925 mg of potassium.  Potato (boiled) -  cup has 255 mg of potassium.  Potato (Mashed) -  cup has 330 mg of potassium.  Prune juice -  cup has 370 mg of potassium.  Prunes - 5 have 305 mg of potassium.  Pudding (chocolate) -  cup has 230 mg of potassium.  Pumpkin (canned) -  cup has 250 mg of potassium.  Raisins (seedless) -  cup has 270 mg of potassium.  Seeds (sunflower or pumpkin) - 1 oz has 240 mg of  potassium.  Soy milk - 1 cup has 300 mg of potassium.  Spinach (cooked) - 1/2 cup has 420 mg of potassium.  Spinach (canned) -  cup has 370 mg of potassium.  Sweet potato (baked with skin) - 1 medium has 450 mg of potassium.  Swiss chard -  cup has 480 mg of potassium.  Tomato or vegetable juice -  cup has 275 mg of potassium.  Tomato (sauce or puree) -  cup has 400-550 mg of potassium.  Tomato (raw) - 1 medium has 290 mg of potassium.  Tomato (canned) -  cup has 200-300 mg of potassium.  [place] - 3 oz has 250 mg of potassium.  Wheat germ - 1 oz has 250 mg of potassium.  Winter squash -  cup has 250 mg of potassium.  Yogurt (plain or fruited) - 6 oz has 260-435 mg of potassium.  Zucchini -  cup has 220 mg of potassium. Moderate in potassium The following foods and beverages have 50-200 mg of potassium per serving:  Apple - 1 fruit has 150 mg of potassium  Apple juice -  cup has 150 mg of potassium  Applesauce -  cup has 90 mg of potassium  Apricot nectar -  cup has 140 mg of potassium  Asparagus (small spears) -  cup has 155 mg of potassium  Asparagus (large spears) - 6 have 155 mg of potassium  Bagel (cinnamon raisin) - 1 four-inch bagel has 130 mg of potassium  Bagel (egg or plain) - 1 four- inch bagel has 70 mg of potassium  Beans (green) -  cup has 90 mg of potassium  Beans (yellow) -  cup has 190 mg of potassium  Beer, regular - 12 oz has 100 mg of potassium  Beets (canned) -  cup has 125 mg of potassium  Blackberries -  cup has 115 mg of potassium  Blueberries -  cup has 60 mg of potassium  Bread (whole wheat) - 1 slice has 70 mg of potassium  Broccoli (raw) -  cup has 145 mg of potassium  Cabbage -  cup has 150 mg of potassium  Carrots (cooked or raw) -  cup has 180 mg of potassium  Cauliflower (raw) -  cup has 150 mg of potassium  Celery (raw) -  cup has 155 mg of potassium  Cereal, bran  flakes -  cup has 120-150 mg  of potassium  Cheese (cottage) -  cup has 110 mg of potassium  Cherries - 10 have 150 mg of potassium  Chocolate - 1 oz bar has 165 mg of potassium  Coffee (brewed) - 6 oz has 90 mg of potassium  Corn -  cup or 1 ear has 195 mg of potassium  Cucumbers -  cup has 80 mg of potassium  Egg - 1 large egg has 60 mg of potassium  Eggplant -  cup has 60 mg of potassium  Endive (raw) -  cup has 80 mg of potassium  English muffin - 1 has 65 mg of potassium  Fish (ocean perch) - 3 oz has 192 mg of potassium  Frankfurter, beef or pork - 1 has 75 mg of potassium  Fruit cocktail -  cup has 115 mg of potassium  Grape juice -  cup has 170 mg of potassium  Grapefruit -  fruit has 175 mg of potassium  Grapes -  cup has 155 mg of potassium  Greens: kale, turnip, collard -  cup has 110-150 mg of potassium  Ice cream or frozen yogurt (chocolate) -  cup has 175 mg of potassium  Ice cream or frozen yogurt (vanilla) -  cup has 120-150 mg of potassium  Lemons, limes - 1 each has 80 mg of potassium  Lettuce - 1 cup has 100 mg of potassium  Mixed vegetables -  cup has 150 mg of potassium  Mushrooms, raw -  cup has 110 mg of potassium  Nuts (walnuts, pecans, or macadamia) - 1 oz has 125 mg of potassium  Oatmeal -  cup has 80 mg of potassium  Okra -  cup has 110 mg of potassium  Onions -  cup has 120 mg of potassium  Peach - 1 has 185 mg of potassium  Peaches (canned) -  cup has 120 mg of potassium  Pears (canned) -  cup has 120 mg of potassium  Peas, green (frozen) -  cup has 90 mg of potassium  Peppers (Green) -  cup has 130 mg of potassium  Peppers (Red) -  cup has 160 mg of potassium  Pineapple juice -  cup has 165 mg of potassium  Pineapple (fresh or canned) -  cup has 100 mg of potassium  Plums - 1 has 105 mg of potassium  Pudding, vanilla -  cup has 150 mg of potassium  Raspberries -  cup has 90 mg of potassium  Rhubarb -  cup has  115 mg of potassium  Rice, wild -  cup has 80 mg of potassium  Shrimp - 3 oz has 155 mg of potassium  Spinach (raw) - 1 cup has 170 mg of potassium  Strawberries -  cup has 125 mg of potassium  Summer squash -  cup has 175-200 mg of potassium  Swiss chard (raw) - 1 cup has 135 mg of potassium  Tangerines - 1 fruit has 140 mg of potassium  Tea, brewed - 6 oz has 65 mg of potassium  Turnips -  cup has 140 mg of potassium  Watermelon -  cup has 85 mg of potassium  Wine (Red, table) - 5 oz has 180 mg of potassium  Wine (White, table) - 5 oz 100 mg of potassium Low in potassium The following foods and beverages have less than 50 mg of potassium per serving.  Bread (white) - 1 slice has 30 mg of potassium  Carbonated beverages - 12 oz has less than 5 mg of potassium  Cheese - 1 oz has 20-30 mg of potassium  Cranberries -  cup has 45 mg of potassium  Cranberry juice cocktail -  cup has 20 mg of potassium  Fats and oils - 1 Tbsp has less than 5 mg of potassium  Hummus - 1 Tbsp has 32 mg of potassium  Nectar (papaya, mango, or pear) -  cup has 35 mg of potassium  Rice (white or brown) -  cup has 50 mg of potassium  Spaghetti or macaroni (cooked) -  cup has 30 mg of potassium  Tortilla, flour or corn - 1 has 50 mg of potassium  Waffle - 1 four-inch waffle has 50 mg of potassium  Water chestnuts -  cup has 40 mg of potassium Summary  Potassium is a mineral found in many foods and drinks. It affects how the heart works, and helps keep fluids and minerals balanced in the body.  The amount of potassium you need each day depends on your age and any existing medical conditions you may have. Your health care provider or dietitian may recommend an amount of potassium that you should have each day. This information is not intended to replace advice given to you by your health care provider. Make sure you discuss any questions you have with your health care  provider. Document Revised: 08/25/2017 Document Reviewed: 12/07/2016 Elsevier Patient Education  [place].  Sciatica  Sciatica is pain, weakness, tingling, or loss of feeling (numbness) along the sciatic nerve. The sciatic nerve starts in the lower back and goes down the back of each leg. Sciatica usually goes away on its own or with treatment. Sometimes, sciatica may come back (recur). What are the causes? This condition happens when the sciatic nerve is pinched or has pressure put on it. This may be the result of:  A disk in between the bones of the spine bulging out too far (herniated disk).  Changes in the spinal disks that occur with aging.  A condition that affects a muscle in the butt.  Extra bone growth near the sciatic nerve.  A break (fracture) of the area between your hip bones (pelvis).  Pregnancy.  Tumor. This is rare. What increases the risk? You are more likely to develop this condition if you:  Play sports that put pressure or stress on the spine.  Have poor strength and ease of movement (flexibility).  Have had a back injury in the past.  Have had back surgery.  Sit for long periods of time.  Do activities that involve bending or lifting over and over again.  Are very overweight (obese). What are the signs or symptoms? Symptoms can vary from mild to very bad. They may include:  Any of these problems in the lower back, leg, hip, or butt: ? Mild tingling, loss of feeling, or dull aches. ? Burning sensations. ? Sharp pains.  Loss of feeling in the back of the calf or the sole of the foot.  Leg weakness.  Very bad back pain that makes it hard to move. These symptoms may get worse when you cough, sneeze, or laugh. They may also get worse when you sit or stand for long periods of time. How is this treated? This condition often gets better without any treatment. However, treatment may include:  Changing or cutting back on physical activity  when you have pain.  Doing exercises and stretching.  Putting ice or  heat on the affected area.  Medicines that help: ? To relieve pain and swelling. ? To relax your muscles.  Shots (injections) of medicines that help to relieve pain, irritation, and swelling.  Surgery. Follow these instructions at home: Medicines  Take over-the-counter and prescription medicines only as told by your doctor.  Ask your doctor if the medicine prescribed to you: ? Requires you to avoid driving or using heavy machinery. ? Can cause trouble pooping (constipation). You may need to take these steps to prevent or treat trouble pooping:  Drink enough fluids to keep your pee (urine) pale yellow.  Take over-the-counter or prescription medicines.  Eat foods that are high in fiber. These include beans, whole grains, and fresh fruits and vegetables.  Limit foods that are high in fat and sugar. These include fried or sweet foods. Managing pain      If told, put ice on the affected area. ? Put ice in a plastic bag. ? Place a towel between your skin and the bag. ? Leave the ice on for 20 minutes, 2-3 times a day.  If told, put heat on the affected area. Use the heat source that your doctor tells you to use, such as a moist heat pack or a heating pad. ? Place a towel between your skin and the heat source. ? Leave the heat on for 20-30 minutes. ? Remove the heat if your skin turns bright red. This is very important if you are unable to feel pain, heat, or cold. You may have a greater risk of getting burned. Activity   Return to your normal activities as told by your doctor. Ask your doctor what activities are safe for you.  Avoid activities that make your symptoms worse.  Take short rests during the day. ? When you rest for a long time, do some physical activity or stretching between periods of rest. ? Avoid sitting for a long time without moving. Get up and move around at least one time each  hour.  Exercise and stretch regularly, as told by your doctor.  Do not lift anything that is heavier than 10 lb (4.5 kg) while you have symptoms of sciatica. ? Avoid lifting heavy things even when you do not have symptoms. ? Avoid lifting heavy things over and over.  When you lift objects, always lift in a way that is safe for your body. To do this, you should: ? Bend your knees. ? Keep the object close to your body. ? Avoid twisting. General instructions  Stay at a healthy weight.  Wear comfortable shoes that support your feet. Avoid wearing high heels.  Avoid sleeping on a mattress that is too soft or too hard. You might have less pain if you sleep on a mattress that is firm enough to support your back.  Keep all follow-up visits as told by your doctor. This is important. Contact a doctor if:  You have pain that: ? Wakes you up when you are sleeping. ? Gets worse when you lie down. ? Is worse than the pain you have had in the past. ? Lasts longer than 4 weeks.  You lose weight without trying. Get help right away if:  You cannot control when you pee (urinate) or poop (have a bowel movement).  You have weakness in any of these areas and it gets worse: ? Lower back. ? The area between your hip bones. ? Butt. ? Legs.  You have redness or swelling of your back.  You  have a burning feeling when you pee. Summary  Sciatica is pain, weakness, tingling, or loss of feeling (numbness) along the sciatic nerve.  This condition happens when the sciatic nerve is pinched or has pressure put on it.  Sciatica can cause pain, tingling, or loss of feeling (numbness) in the lower back, legs, hips, and butt.  Treatment often includes rest, exercise, medicines, and putting ice or heat on the affected area. This information is not intended to replace advice given to you by your health care provider. Make sure you discuss any questions you have with your health care provider. Document  Revised: 10/01/2018 Document Reviewed: 10/01/2018 Elsevier Patient Education  [place].

## 2020-08-26 LAB — BASIC METABOLIC PANEL WITH GFR
BUN: 16 mg/dL (ref 7–25)
CO2: 26 mmol/L (ref 20–32)
Calcium: 9 mg/dL (ref 8.6–10.4)
Chloride: 103 mmol/L (ref 98–110)
Creat: 1.05 mg/dL (ref 0.50–1.05)
GFR, Est African American: 68 mL/min/{1.73_m2} (ref >=60)
GFR, Est Non African American: 59 mL/min/{1.73_m2} — ABNORMAL LOW (ref >=60)
Glucose, Bld: 97 mg/dL (ref 65–99)
Potassium: 3.3 mmol/L — ABNORMAL LOW (ref 3.5–5.3)
Sodium: 141 mmol/L (ref 135–146)

## 2020-08-27 ENCOUNTER — Telehealth: Payer: Self-pay | Admitting: *Deleted

## 2020-08-27 NOTE — Telephone Encounter (Signed)
Spoke with pt and appt scheduled for 12.9.2021

## 2020-08-27 NOTE — Telephone Encounter (Signed)
Reviewed lab results with the patient. Instead of continuing Potassium she prefers follow-up appointment to discuss alternatives. No availability thru 09/10/20. Please call patient with appointment. Okay to leave message on cell#.

## 2020-09-03 ENCOUNTER — Ambulatory Visit (INDEPENDENT_AMBULATORY_CARE_PROVIDER_SITE_OTHER): Payer: BC Managed Care – PPO | Admitting: Family Medicine

## 2020-09-03 ENCOUNTER — Encounter: Payer: Self-pay | Admitting: Family Medicine

## 2020-09-03 ENCOUNTER — Other Ambulatory Visit: Payer: Self-pay

## 2020-09-03 VITALS — BP 124/76 | HR 61 | Temp 97.9°F | Resp 16 | Ht 62.0 in | Wt 174.5 lb

## 2020-09-03 DIAGNOSIS — E876 Hypokalemia: Secondary | ICD-10-CM

## 2020-09-03 DIAGNOSIS — I10 Essential (primary) hypertension: Secondary | ICD-10-CM

## 2020-09-03 DIAGNOSIS — N179 Acute kidney failure, unspecified: Secondary | ICD-10-CM | POA: Diagnosis not present

## 2020-09-03 NOTE — Progress Notes (Signed)
Name: Regina Dunlap   MRN: [id number]    DOB: 11-15-1960   Date:09/03/2020       Progress Note  Chief Complaint  Patient presents with  . Follow-up    Discuss Potassium     Subjective:   Regina Dunlap is a 59 y.o. female, presents to clinic for f/up on renal function and hypokalemia  Pt potassium has been a little low, 3.2 and then increased to 3.3 with oral potassium replacement She has been on chlorthalidone for several years and only recently was hypokalemic.  She is also on losartan 100 mg  Her kidney function with recent labs decreased below 60 which is abnormal for her.  She does state that she was changing her diet and had done prep for colonoscopy so she feels that she may have been dehydrated.  She has been taking potassium 10 mEq daily and she is working on changing her diet to incorporate more potassium rich foods.  She does not want to repeat labs today and would like to wait a few months to see how her diet changes affect her blood work.  She has well-controlled hypertension, BP at goal today, good med compliance, no concerns She is hesitant to change her chlorthalidone medication so wants to wait   Hypertension: Currently managed on chlorthalidone 25 mg daily and losartan 100 mg daily Pt reports good med compliance and denies any SE.   Blood pressure today is well controlled. BP Readings from Last 3 Encounters:  09/03/20 124/76  08/25/20 110/72  08/07/20 115/78   Pt denies CP, SOB, exertional sx, LE edema, palpitation, Ha's, visual disturbances, lightheadedness, hypotension, syncope. Patient denies ever experiencing any lower extremity edema, she used to be on higher dose of chlorthalidone it was decreased in 2017 due to low blood pressure.  Lab Results  Component Value Date   CREATININE 1.05 08/25/2020   Lab Results  Component Value Date   BUN 16 08/25/2020    Lab Results  Component Value Date   GFRNONAA 59 (L) 08/25/2020   GFRNONAA 72  08/05/2020   GFRNONAA 78 05/01/2020      Current Outpatient Medications:  .  Ascorbic Acid (VITAMIN C) 100 MG tablet, Take 500 mg by mouth daily., Disp: , Rfl:  .  aspirin 81 MG tablet, Take 81 mg by mouth daily., Disp: , Rfl:  .  chlorthalidone (HYGROTON) 25 MG tablet, TAKE 1 TABLET(25 MG) BY MOUTH DAILY, Disp: 90 tablet, Rfl: 3 .  cholecalciferol (VITAMIN D3) 25 MCG (1000 UNIT) tablet, Take 5,000 Units by mouth daily., Disp: , Rfl:  .  famotidine (PEPCID) 20 MG tablet, TAKE 1 TABLET BY MOUTH TWICE DAILY AS NEEDED FOR HEARTBURN OR INDIGESTION, Disp: 180 tablet, Rfl: 1 .  Flaxseed Oil OIL, by Does not apply route., Disp: , Rfl:  .  fluticasone (FLONASE) 50 MCG/ACT nasal spray, Place 2 sprays into both nostrils daily. (Patient taking differently: Place 2 sprays into both nostrils daily as needed.), Disp: 16 g, Rfl: 6 .  Krill Oil 1000 MG CAPS, Take 1 capsule daily by mouth. , Disp: , Rfl:  .  loratadine (CLARITIN) 10 MG tablet, Take 1 tablet (10 mg total) by mouth daily as needed for allergies., Disp: 30 tablet, Rfl: 11 .  losartan (COZAAR) 100 MG tablet, Take 1 tablet (100 mg total) by mouth daily., Disp: 90 tablet, Rfl: 3 .  Magnesium 100 MG CAPS, Take 1,000 mg by mouth., Disp: , Rfl:  .  Multiple Vitamins-Minerals (HAIR/SKIN/NAILS/BIOTIN)  TABS, Take by mouth., Disp: , Rfl:  .  potassium chloride (KLOR-CON) 10 MEQ tablet, Take 1 tablet BID x 3 d then take 1 tablet daily, Disp: 96 tablet, Rfl: 1 .  simvastatin (ZOCOR) 20 MG tablet, Take 1 tablet (20 mg total) by mouth at bedtime., Disp: 90 tablet, Rfl: 3 .  TURMERIC PO, Take by mouth daily. Reported on 03/28/2016, Disp: , Rfl:  .  vitamin B-12 (CYANOCOBALAMIN) 100 MCG tablet, Take 100 mcg by mouth daily., Disp: , Rfl:   Patient Active Problem List   Diagnosis Date Noted  . Vitamin D deficiency 05/01/2020  . Hot flashes 07/15/2019  . Varicose veins of leg with pain, left 04/23/2019  . Hypokalemia 11/23/2017  . Postmenopausal bleeding  10/17/2017  . Varicose veins of both lower extremities without ulcer or inflammation 04/27/2017  . Sleep apnea 04/24/2017  . Encounter for screening colonoscopy 04/24/2017  . Obesity (BMI 30.0-34.9) 11/04/2016  . Fatty liver 11/04/2016  . History of concussion 06/07/2016  . MCI (mild cognitive impairment) 06/07/2016  . Medication monitoring encounter 03/28/2016  . Prediabetes 03/28/2016  . Memory impairment 03/28/2016  . Decreased sense of smell 03/28/2016  . Hypercholesterolemia with hypertriglyceridemia 10/13/2015  . Encounter for screening mammogram for malignant neoplasm of breast 10/13/2015  . GERD without esophagitis 10/13/2015  . Screening for STD (sexually transmitted disease) 10/13/2015  . Allergic rhinitis 09/16/2015  . Lipoma of arm 09/16/2015  . Family history of colon cancer 09/16/2015  . Shortness of breath 05/27/2014  . Hypertension goal BP (blood pressure) < 140/90     Past Surgical History:  Procedure Laterality Date  . BREAST BIOPSY Right 1999   neg  . BREAST CYST ASPIRATION Right    neg  . COLONOSCOPY WITH PROPOFOL N/A 08/07/2020   Procedure: COLONOSCOPY WITH PROPOFOL;  Surgeon: Lucilla Lame, MD;  Location: [facility];  Service: Endoscopy;  Laterality: N/A;  Sleep apnea priority 4  . TUBAL LIGATION      Family History  Problem Relation Age of Onset  . Migraines Mother   . Stroke Father   . Hypertension Father   . Hypertension Sister   . Anuerysm Brother   . Hypertension Sister   . Cancer Sister        cervical  . Cancer Sister        colon  . Breast cancer Neg Hx     Social History   Tobacco Use  . Smoking status: Never Smoker  . Smokeless tobacco: Never Used  Vaping Use  . Vaping Use: Never used  Substance Use Topics  . Alcohol use: Yes    Comment: social  . Drug use: No     Allergies  Allergen Reactions  . Ciprofloxacin Diarrhea and Nausea And Vomiting  . Lisinopril Cough  . Migraine Formula  [Aspirin-Acetaminophen-Caffeine] Rash    Midrina; broke out in rash   . Povidone Iodine Rash    Health Maintenance  Topic Date Due  . COVID-19 Vaccine (3 - Pfizer risk 4-dose series) 02/12/2020  . MAMMOGRAM  07/15/2021  . PAP SMEAR-Modifier  06/20/2023  . COLONOSCOPY  08/07/2025  . TETANUS/TDAP  11/04/2026  . INFLUENZA VACCINE  Completed  . Hepatitis C Screening  Completed  . HIV Screening  Completed    Chart Review Today: I personally reviewed active problem list, medication list, allergies, family history, social history, health maintenance, notes from last encounter, lab results, imaging with the patient/caregiver today.   Review of Systems  10 Systems reviewed  and are negative for acute change except as noted in the HPI.  Objective:   Vitals:   09/03/20 1402  BP: 124/76  Pulse: 61  Resp: 16  Temp: 97.9 F (36.6 C)  TempSrc: Oral  SpO2: 98%  Weight: 174 lb 8 oz (79.2 kg)  Height: 5\' 2"  (1.575 m)    Body mass index is 31.92 kg/m.  Physical Exam Vitals and nursing note reviewed.  Constitutional:      General: She is not in acute distress.    Appearance: She is obese. She is not ill-appearing, toxic-appearing or diaphoretic.  HENT:     Head: Normocephalic and atraumatic.  Cardiovascular:     Rate and Rhythm: Normal rate and regular rhythm.     Pulses: Normal pulses.     Heart sounds: Normal heart sounds.  Pulmonary:     Effort: Pulmonary effort is normal.     Breath sounds: Normal breath sounds.  Musculoskeletal:     Right lower leg: No edema.     Left lower leg: No edema.  Skin:    General: Skin is warm and dry.  Neurological:     Mental Status: She is alert.  Psychiatric:        Mood and Affect: Mood normal.        Behavior: Behavior normal.         Assessment & Plan:     ICD-10-CM   1. Hypokalemia  H47.4 COMPLETE METABOLIC PANEL WITH GFR   continue 10 mEq daily potassium supplement and add potassium rich foods to diet, wanted to recheck  labs today, pt wishes to wait a few months  2. AKI (acute kidney injury) ([place])  Q59.5 COMPLETE METABOLIC PANEL WITH GFR   Slight decline in renal function, patient states that around that time she was doing prep for colonoscopy likely due to dehydration/hypovolemia  3. Hypertension goal BP (blood pressure) < 140/90  I10    Stable, well-controlled, blood pressure at goal today patient wishes to continue chlorthalidone 25 mg and losartan 100 mg   Did discuss decreasing her chlorthalidone medication or changing to a different blood pressure medication such as amlodipine and losartan, patient would not like to change anything at this time and wait and see If we cannot improve her potassium may need her to consult with the nephrologist.  She has f/up in March She would like to defer labs to then - asked to complete prior to [place], PA-C 09/03/20 2:32 PM

## 2020-09-04 ENCOUNTER — Ambulatory Visit: Payer: BC Managed Care – PPO | Admitting: Family Medicine

## 2020-09-07 DIAGNOSIS — G4733 Obstructive sleep apnea (adult) (pediatric): Secondary | ICD-10-CM | POA: Diagnosis not present

## 2020-10-08 DIAGNOSIS — G4733 Obstructive sleep apnea (adult) (pediatric): Secondary | ICD-10-CM | POA: Diagnosis not present

## 2020-10-26 ENCOUNTER — Ambulatory Visit (INDEPENDENT_AMBULATORY_CARE_PROVIDER_SITE_OTHER): Payer: BC Managed Care – PPO | Admitting: Internal Medicine

## 2020-10-26 VITALS — BP 113/61 | HR 85 | Temp 98.8°F | Resp 16 | Ht 62.0 in | Wt 177.0 lb

## 2020-10-26 DIAGNOSIS — G4733 Obstructive sleep apnea (adult) (pediatric): Secondary | ICD-10-CM

## 2020-10-26 DIAGNOSIS — E669 Obesity, unspecified: Secondary | ICD-10-CM | POA: Diagnosis not present

## 2020-10-26 DIAGNOSIS — I10 Essential (primary) hypertension: Secondary | ICD-10-CM

## 2020-10-26 DIAGNOSIS — K219 Gastro-esophageal reflux disease without esophagitis: Secondary | ICD-10-CM

## 2020-10-26 DIAGNOSIS — Z7189 Other specified counseling: Secondary | ICD-10-CM

## 2020-10-26 NOTE — Patient Instructions (Signed)

## 2020-10-26 NOTE — Progress Notes (Unsigned)
[facility], [place] [address]  Pulmonary Sleep Medicine   Office Visit Note  Patient Name: Regina Dunlap DOB: [birth year] MRN [id number]    Chief Complaint: Obstructive Sleep Apnea visit  Brief History:  Regina Dunlap is seen today for initial consultation. The patient has a 7 year history of sleep apnea. She recently received a replacement machine.  Patient is using PAP nightly.  The patient feels more rested after sleeping with PAP.  The patient reports benefiting from PAP use. Reported sleepiness is  improved and the Epworth Sleepiness Score is 0 out of 24. The patient does not take naps. The patient complains of the following: some dryness  The compliance download shows excellent compliance with an average use time of 8.2 hours. The AHI is 0.3  The patient does not complain of limb movements disrupting sleep.  ROS  General: (-) fever, (-) chills, (-) night sweat Nose and Sinuses: (-) nasal stuffiness or itchiness, (-) postnasal drip, (-) nosebleeds, (-) sinus trouble. Mouth and Throat: (-) sore throat, (-) hoarseness. Neck: (-) swollen glands, (-) enlarged thyroid, (-) neck pain. Respiratory: - cough, - shortness of breath, - wheezing. Neurologic: - numbness, - tingling. Psychiatric: - anxiety, - depression   Current Medication: Outpatient Encounter Medications as of 10/26/2020  Medication Sig  . Ascorbic Acid (VITAMIN C) 100 MG tablet Take 500 mg by mouth daily.  Marland Kitchen aspirin 81 MG tablet Take 81 mg by mouth daily.  . chlorthalidone (HYGROTON) 25 MG tablet TAKE 1 TABLET(25 MG) BY MOUTH DAILY  . cholecalciferol (VITAMIN D3) 25 MCG (1000 UNIT) tablet Take 5,000 Units by mouth daily.  . famotidine (PEPCID) 20 MG tablet TAKE 1 TABLET BY MOUTH TWICE DAILY AS NEEDED FOR HEARTBURN OR INDIGESTION  . Flaxseed Oil OIL by Does not apply route.  . fluticasone (FLONASE) 50 MCG/ACT nasal spray Place 2 sprays into both nostrils daily. (Patient taking  differently: Place 2 sprays into both nostrils daily as needed.)  . Krill Oil 1000 MG CAPS Take 1 capsule daily by mouth.   . loratadine (CLARITIN) 10 MG tablet Take 1 tablet (10 mg total) by mouth daily as needed for allergies.  Marland Kitchen losartan (COZAAR) 100 MG tablet Take 1 tablet (100 mg total) by mouth daily.  . Magnesium 100 MG CAPS Take 1,000 mg by mouth.  . Multiple Vitamins-Minerals (HAIR/SKIN/NAILS/BIOTIN) TABS Take by mouth.  . potassium chloride (KLOR-CON) 10 MEQ tablet Take 1 tablet BID x 3 d then take 1 tablet daily  . simvastatin (ZOCOR) 20 MG tablet Take 1 tablet (20 mg total) by mouth at bedtime.  . TURMERIC PO Take by mouth daily. Reported on 03/28/2016  . vitamin B-12 (CYANOCOBALAMIN) 100 MCG tablet Take 100 mcg by mouth daily.   No facility-administered encounter medications on file as of 10/26/2020.    Surgical History: Past Surgical History:  Procedure Laterality Date  . BREAST BIOPSY Right 1999   neg  . BREAST CYST ASPIRATION Right    neg  . COLONOSCOPY WITH PROPOFOL N/A 08/07/2020   Procedure: COLONOSCOPY WITH PROPOFOL;  Surgeon: Lucilla Lame, MD;  Location: [facility];  Service: Endoscopy;  Laterality: N/A;  Sleep apnea priority 4  . TUBAL LIGATION      Medical History: Past Medical History:  Diagnosis Date  . Esophageal reflux   . Fatty liver 11/04/2016  . Mastodynia   . Other and unspecified hyperlipidemia   . Overweight (BMI 25.0-29.9) 11/04/2016  . Pain in thoracic spine   .  Prediabetes 03/28/2016  . Unspecified essential hypertension   . Unspecified sleep apnea    CPAP    Family History: Non contributory to the present illness  Social History: Social History   Socioeconomic History  . Marital status: Married    Spouse name: Not on file  . Number of children: Not on file  . Years of education: Not on file  . Highest education level: Not on file  Occupational History  . Not on file  Tobacco Use  . Smoking status: Never Smoker  . Smokeless  tobacco: Never Used  Vaping Use  . Vaping Use: Never used  Substance and Sexual Activity  . Alcohol use: Yes    Comment: social  . Drug use: No  . Sexual activity: Yes  Other Topics Concern  . Not on file  Social History Narrative  . Not on file   Social Determinants of Health   Financial Resource Strain: Low Risk   . Difficulty of Paying Living Expenses: Not hard at all  Food Insecurity: No Food Insecurity  . Worried About Charity fundraiser in the Last Year: Never true  . Ran Out of Food in the Last Year: Never true  Transportation Needs: No Transportation Needs  . Lack of Transportation (Medical): No  . Lack of Transportation (Non-Medical): No  Physical Activity: Insufficiently Active  . Days of Exercise per Week: 3 days  . Minutes of Exercise per Session: 30 min  Stress: No Stress Concern Present  . Feeling of Stress : Only a little  Social Connections: Moderately Isolated  . Frequency of Communication with Friends and Family: More than three times a week  . Frequency of Social Gatherings with Friends and Family: Never  . Attends Religious Services: Never  . Active Member of Clubs or Organizations: No  . Attends Archivist Meetings: Never  . Marital Status: Married  Human resources officer Violence: Not At Risk  . Fear of Current or Ex-Partner: No  . Emotionally Abused: No  . Physically Abused: No  . Sexually Abused: No    Vital Signs: Blood pressure 113/61, pulse 85, temperature 98.8 F (37.1 C), temperature source Temporal, resp. rate 16, height 5\' 2"  (1.575 m), weight 177 lb (80.3 kg), SpO2 98 %.  Examination: General Appearance: The patient is well-developed, well-nourished, and in no distress. Neck Circumference:  Skin: Gross inspection of skin unremarkable. Head: normocephalic, no gross deformities. Eyes: no gross deformities noted. ENT: ears appear grossly normal Neurologic: Alert and oriented. No involuntary movements.    EPWORTH SLEEPINESS  SCALE:  Scale:  (0)= no chance of dozing; (1)= slight chance of dozing; (2)= moderate chance of dozing; (3)= high chance of dozing  Chance  Situtation    Sitting and reading: 0    Watching TV: 0    Sitting Inactive in public: 0    As a passenger in car: 0      Lying down to rest: 0    Sitting and talking: 0    Sitting quielty after lunch: 0    In a car, stopped in traffic: 0   TOTAL SCORE:   0 out of 24    SLEEP STUDIES:  1. PSG 04/08/14 AHI 18 SpO3min 83%   CPAP COMPLIANCE DATA:  Date Range: 07/25/20 - 10/22/20  Average Daily Use: 7:47 hours  Median Use: 8:25  Compliance for > 4 Hours: 93%  AHI: 0.3 respiratory events per hour  Days Used: 86/90  Mask Leak: 4.4  95th Percentile  Pressure: 10.8 cmH2O         LABS: Recent Results (from the past 2160 hour(s))  CBC with Differential/Platelet     Status: None   Collection Time: 08/05/20  9:41 AM  Result Value Ref Range   WBC 5.4 3.8 - 10.8 Thousand/uL   RBC 4.72 3.80 - 5.10 Million/uL   Hemoglobin 13.7 11.7 - 15.5 g/dL   HCT 41.0 35.0 - 45.0 %   MCV 86.9 80.0 - 100.0 fL   MCH 29.0 27.0 - 33.0 pg   MCHC 33.4 32.0 - 36.0 g/dL   RDW 12.8 11.0 - 15.0 %   Platelets 249 140 - 400 Thousand/uL   MPV 10.7 7.5 - 12.5 fL   Neutro Abs 3,040 1,500 - 7,800 cells/uL   Lymphs Abs 1,863 850 - 3,900 cells/uL   Absolute Monocytes 248 200 - 950 cells/uL   Eosinophils Absolute 211 15 - 500 cells/uL   Basophils Absolute 38 0 - 200 cells/uL   Neutrophils Relative % 56.3 %   Total Lymphocyte 34.5 %   Monocytes Relative 4.6 %   Eosinophils Relative 3.9 %   Basophils Relative 0.7 %  COMPLETE METABOLIC PANEL WITH GFR     Status: Abnormal   Collection Time: 08/05/20  9:41 AM  Result Value Ref Range   Glucose, Bld 102 (H) 65 - 99 mg/dL    Comment: .            Fasting reference interval . For someone without known diabetes, a glucose value between 100 and 125 mg/dL is consistent with prediabetes and should be  confirmed with a follow-up test. .    BUN 20 7 - 25 mg/dL   Creat 0.89 0.50 - 1.05 mg/dL    Comment: For patients >28 years of age, the reference limit for Creatinine is approximately 13% higher for people identified as African-American. .    GFR, Est Non African American 72 > OR = 60 mL/min/1.79m2   GFR, Est African American 83 > OR = 60 mL/min/1.62m2   BUN/Creatinine Ratio NOT APPLICABLE 6 - 22 (calc)   Sodium 140 135 - 146 mmol/L   Potassium 3.2 (L) 3.5 - 5.3 mmol/L   Chloride 102 98 - 110 mmol/L   CO2 30 20 - 32 mmol/L   Calcium 9.6 8.6 - 10.4 mg/dL   Total Protein 6.8 6.1 - 8.1 g/dL   Albumin 4.1 3.6 - 5.1 g/dL   Globulin 2.7 1.9 - 3.7 g/dL (calc)   AG Ratio 1.5 1.0 - 2.5 (calc)   Total Bilirubin 0.6 0.2 - 1.2 mg/dL   Alkaline phosphatase (APISO) 88 37 - 153 U/L   AST 18 10 - 35 U/L   ALT 17 6 - 29 U/L  Lipid panel     Status: Abnormal   Collection Time: 08/05/20  9:41 AM  Result Value Ref Range   Cholesterol 174 <200 mg/dL   HDL 51 > OR = 50 mg/dL   Triglycerides 198 (H) <150 mg/dL   LDL Cholesterol (Calc) 93 mg/dL (calc)    Comment: Reference range: <100 . Desirable range <100 mg/dL for primary prevention;   <70 mg/dL for patients with CHD or diabetic patients  with > or = 2 CHD risk factors. Marland Kitchen LDL-C is now calculated using the Martin-Hopkins  calculation, which is a validated novel method providing  better accuracy than the Friedewald equation in the  estimation of LDL-C.  Cresenciano Genre et al. Annamaria Helling. 6948;546(27): 2061-2068  (http://education.[employer].com/faq/FAQ164)    Total  CHOL/HDL Ratio 3.4 <5.0 (calc)   Non-HDL Cholesterol (Calc) 123 <130 mg/dL (calc)    Comment: For patients with diabetes plus 1 major ASCVD risk  factor, treating to a non-HDL-C goal of <100 mg/dL  (LDL-C of <70 mg/dL) is considered a therapeutic  option.   Hemoglobin A1c     Status: Abnormal   Collection Time: 08/05/20  9:41 AM  Result Value Ref Range   Hgb A1c MFr Bld 6.2 (H)  <5.7 % of total Hgb    Comment: For someone without known diabetes, a hemoglobin  A1c value between 5.7% and 6.4% is consistent with prediabetes and should be confirmed with a  follow-up test. . For someone with known diabetes, a value <7% indicates that their diabetes is well controlled. A1c targets should be individualized based on duration of diabetes, age, comorbid conditions, and other considerations. . This assay result is consistent with an increased risk of diabetes. . Currently, no consensus exists regarding use of hemoglobin A1c for diagnosis of diabetes for children. .    Mean Plasma Glucose 131 (calc)   eAG (mmol/L) 7.3 (calc)  SARS CORONAVIRUS 2 (TAT 6-24 HRS) Nasopharyngeal Nasopharyngeal Swab     Status: None   Collection Time: 08/05/20  1:51 PM   Specimen: Nasopharyngeal Swab  Result Value Ref Range   SARS Coronavirus 2 NEGATIVE NEGATIVE    Comment: (NOTE) SARS-CoV-2 target nucleic acids are NOT DETECTED.  The SARS-CoV-2 RNA is generally detectable in upper and lower respiratory specimens during the acute phase of infection. Negative results do not preclude SARS-CoV-2 infection, do not rule out co-infections with other pathogens, and should not be used as the sole basis for treatment or other patient management decisions. Negative results must be combined with clinical observations, patient history, and epidemiological information. The expected result is Negative.  Fact Sheet for Patients: SugarRoll.be  Fact Sheet for Healthcare Providers: https://www.woods-mathews.com/  This test is not yet approved or cleared by the [place] FDA and  has been authorized for detection and/or diagnosis of SARS-CoV-2 by FDA under an Emergency Use Authorization (EUA). This EUA will remain  in effect (meaning this test can be used) for the duration of the COVID-19 declaration under Se ction 564(b)(1) of the Act, 21  U.S.C. section 360bbb-3(b)(1), unless the authorization is terminated or revoked sooner.  Performed at [facility], [place] [address], [place], [place] [id number]   BASIC METABOLIC PANEL WITH GFR     Status: Abnormal   Collection Time: 08/25/20  9:02 AM  Result Value Ref Range   Glucose, Bld 97 65 - 99 mg/dL    Comment: .            Fasting reference interval .    BUN 16 7 - 25 mg/dL   Creat 1.05 0.50 - 1.05 mg/dL    Comment: For patients >108 years of age, the reference limit for Creatinine is approximately 13% higher for people identified as African-American. .    GFR, Est Non African American 59 (L) > OR = 60 mL/min/1.82m2   GFR, Est African American 68 > OR = 60 mL/min/1.47m2   BUN/Creatinine Ratio NOT APPLICABLE 6 - 22 (calc)   Sodium 141 135 - 146 mmol/L   Potassium 3.3 (L) 3.5 - 5.3 mmol/L   Chloride 103 98 - 110 mmol/L   CO2 26 20 - 32 mmol/L   Calcium 9.0 8.6 - 10.4 mg/dL    Radiology: DG Lumbar Spine Complete  Result Date: 08/25/2020 CLINICAL  DATA:  Posterior right hip and lower extremity pain intermittently for 3 months. EXAM: LUMBAR SPINE - COMPLETE 4+ VIEW COMPARISON:  None. FINDINGS: No fracture. Trace facet mediated anterolisthesis L4 on L5. Mild loss of disc space height at L5-S1. Tubal ligation clips noted. IMPRESSION: Mild appearing lower lumbar degenerative change. No acute abnormality. Electronically Signed   By: Inge Rise M.D.   On: 08/25/2020 15:32   DG Hip Unilat W OR W/O Pelvis 2-3 Views Right  Result Date: 08/25/2020 CLINICAL DATA:  Posterior right hip pain radiating to the right calf intermittently for 3 months. EXAM: DG HIP (WITH OR WITHOUT PELVIS) 2-3V RIGHT COMPARISON:  None. FINDINGS: No fracture or hip dislocation is identified. Right hip joint space width is preserved with at most minimal marginal acetabular spurring. No destructive osseous lesion is evident. Tubal ligation clips are noted in the pelvis. IMPRESSION: No acute osseous  abnormality or evidence of significant arthropathy. Electronically Signed   By: Logan Bores M.D.   On: 08/25/2020 15:35    No results found.  No results found.    Assessment and Plan: Patient Active Problem List   Diagnosis Date Noted  . Vitamin D deficiency 05/01/2020  . Hot flashes 07/15/2019  . Varicose veins of leg with pain, left 04/23/2019  . Hypokalemia 11/23/2017  . Postmenopausal bleeding 10/17/2017  . Varicose veins of both lower extremities without ulcer or inflammation 04/27/2017  . Sleep apnea 04/24/2017  . Encounter for screening colonoscopy 04/24/2017  . Obesity (BMI 30.0-34.9) 11/04/2016  . Fatty liver 11/04/2016  . History of concussion 06/07/2016  . MCI (mild cognitive impairment) 06/07/2016  . Medication monitoring encounter 03/28/2016  . Prediabetes 03/28/2016  . Memory impairment 03/28/2016  . Decreased sense of smell 03/28/2016  . Hypercholesterolemia with hypertriglyceridemia 10/13/2015  . Encounter for screening mammogram for malignant neoplasm of breast 10/13/2015  . GERD without esophagitis 10/13/2015  . Screening for STD (sexually transmitted disease) 10/13/2015  . Allergic rhinitis 09/16/2015  . Lipoma of arm 09/16/2015  . Family history of colon cancer 09/16/2015  . Shortness of breath 05/27/2014  . Hypertension goal BP (blood pressure) < 140/90       The patient does tolerate PAP and reports significant benefit from PAP use. The patient is caring for the machine as recommended. a The patient was also counselled to continue to exercise regularly and watch her diet. The compliance is excellent. The apnea is well controlled..   1. OSA-continue excellent compliance. 2. CPAP couseling-Discussed importance of adequate CPAP use as well as proper care and cleaning techniques of machine and all supplies. 3. GERD- Continue current medication. 4. HTN- Stable. Continue current medication 5. Obesity- Discussed the importance of weight management  through healthy eating and daily exercise as tolerated. Discussed the negative effects obesity has on pulmonary health, cardiac health as well as overall general health and well being.   General Counseling: I have discussed the findings of the evaluation and examination with Regina Dunlap.  I have also discussed any further diagnostic evaluation thatmay be needed or ordered today. Regina Dunlap verbalizes understanding of the findings of todays visit. We also reviewed her medications today and discussed drug interactions and side effects including but not limited excessive drowsiness and altered mental states. We also discussed that there is always a risk not just to her but also people around her. she has been encouraged to call the office with any questions or concerns that should arise related to todays visit.  No orders of the defined  types were placed in this encounter.     I have personally obtained a history, examined the patient, evaluated laboratory and imaging results, formulated the assessment and plan and placed orders.  This patient was seen by Drema Dallas, PA-C in collaboration with Dr. Devona Konig as a part of collaborative care agreement.   Richelle Ito Saunders Glance, PhD, FAASM  Diplomate, American Board of Sleep Medicine    Allyne Gee, MD [facility] Diplomate ABMS Pulmonary and Critical Care Medicine Sleep medicine

## 2020-11-08 DIAGNOSIS — G4733 Obstructive sleep apnea (adult) (pediatric): Secondary | ICD-10-CM | POA: Diagnosis not present

## 2020-12-18 ENCOUNTER — Ambulatory Visit: Payer: BC Managed Care – PPO | Admitting: Family Medicine

## 2020-12-22 DIAGNOSIS — Z20822 Contact with and (suspected) exposure to covid-19: Secondary | ICD-10-CM | POA: Diagnosis not present

## 2021-01-12 ENCOUNTER — Other Ambulatory Visit: Payer: Self-pay | Admitting: Family Medicine

## 2021-01-12 DIAGNOSIS — I10 Essential (primary) hypertension: Secondary | ICD-10-CM

## 2021-01-12 NOTE — Telephone Encounter (Signed)
Last appt:12/21 Next 4/21

## 2021-01-12 NOTE — Progress Notes (Signed)
Name: Regina Dunlap   MRN: [id number]    DOB: [birth year]   Date:01/14/2021       Progress Note  Subjective  Chief Complaint  Follow Up  HPI  HTN: she is taking Chlorthalidone and Losartan , potassium has been low and she is not taking potassium supplementation. She denies chest pain or palpitations. Explained we can change her medication to a weaker diuretic and do a combo medication like Benicar 40/25 mg, she will return one week after starting new medication to check bp and repeat potassium level.   Rash: she was on vacation and walking on grass, developed a bumpy rash on her ankles and a spot on her arm, it is drying out now.   Seasonal Allergies: she has been using Flonase once a day and switched from Loratadine to Zyrtec since loratadine was no longer working. Discussed adding Astelin but she would like to hold off for now  GERD: she is doing well, no heartburn or indigestion, she stopped taking Pepcid   Upper back pain: while on vacation she was more active and developed upper back soreness, not associated with diaphoresis, nausea or sob. She took Aleve and symptoms resolved. Discussed atypical symptoms or heart attacks  Patient Active Problem List   Diagnosis Date Noted  . Vitamin D deficiency 05/01/2020  . Hot flashes 07/15/2019  . Varicose veins of leg with pain, left 04/23/2019  . Hypokalemia 11/23/2017  . Postmenopausal bleeding 10/17/2017  . Varicose veins of both lower extremities without ulcer or inflammation 04/27/2017  . Sleep apnea 04/24/2017  . Encounter for screening colonoscopy 04/24/2017  . Obesity (BMI 30.0-34.9) 11/04/2016  . Fatty liver 11/04/2016  . History of concussion 06/07/2016  . MCI (mild cognitive impairment) 06/07/2016  . Prediabetes 03/28/2016  . Memory impairment 03/28/2016  . Decreased sense of smell 03/28/2016  . Hypercholesterolemia with hypertriglyceridemia 10/13/2015  . Encounter for screening mammogram for malignant neoplasm of  breast 10/13/2015  . GERD without esophagitis 10/13/2015  . Screening for STD (sexually transmitted disease) 10/13/2015  . Allergic rhinitis 09/16/2015  . Lipoma of arm 09/16/2015  . Family history of colon cancer 09/16/2015  . Hypertension goal BP (blood pressure) < 140/90     Past Surgical History:  Procedure Laterality Date  . BREAST BIOPSY Right 1999   neg  . BREAST CYST ASPIRATION Right    neg  . COLONOSCOPY WITH PROPOFOL N/A 08/07/2020   Procedure: COLONOSCOPY WITH PROPOFOL;  Surgeon: Lucilla Lame, MD;  Location: [facility];  Service: Endoscopy;  Laterality: N/A;  Sleep apnea priority 4  . TUBAL LIGATION      Family History  Problem Relation Age of Onset  . Migraines Mother   . Stroke Father   . Hypertension Father   . Hypertension Sister   . Anuerysm Brother   . Hypertension Sister   . Cancer Sister        cervical  . Cancer Sister        colon  . Breast cancer Neg Hx     Social History   Tobacco Use  . Smoking status: Never Smoker  . Smokeless tobacco: Never Used  Substance Use Topics  . Alcohol use: Yes    Comment: social     Current Outpatient Medications:  .  Ascorbic Acid (VITAMIN C) 100 MG tablet, Take 500 mg by mouth daily., Disp: , Rfl:  .  aspirin 81 MG tablet, Take 81 mg by mouth daily., Disp: , Rfl:  .  cholecalciferol (  VITAMIN D3) 25 MCG (1000 UNIT) tablet, Take 5,000 Units by mouth daily., Disp: , Rfl:  .  fluticasone (FLONASE) 50 MCG/ACT nasal spray, Place 2 sprays into both nostrils daily. (Patient taking differently: Place 2 sprays into both nostrils daily as needed.), Disp: 16 g, Rfl: 6 .  Krill Oil 1000 MG CAPS, Take 1 capsule daily by mouth. , Disp: , Rfl:  .  Magnesium 100 MG CAPS, Take 1,000 mg by mouth., Disp: , Rfl:  .  Multiple Vitamins-Minerals (HAIR/SKIN/NAILS/BIOTIN) TABS, Take by mouth., Disp: , Rfl:  .  olmesartan-hydrochlorothiazide (BENICAR HCT) 40-25 MG tablet, Take 1 tablet by mouth daily. In place of  chlorthalidone and losartan, Disp: 90 tablet, Rfl: 0 .  simvastatin (ZOCOR) 20 MG tablet, Take 1 tablet (20 mg total) by mouth at bedtime., Disp: 90 tablet, Rfl: 3 .  TURMERIC PO, Take by mouth daily. Reported on 03/28/2016, Disp: , Rfl:  .  vitamin B-12 (CYANOCOBALAMIN) 100 MCG tablet, Take 100 mcg by mouth daily., Disp: , Rfl:   Allergies  Allergen Reactions  . Ciprofloxacin Diarrhea and Nausea And Vomiting  . Lisinopril Cough  . Migraine Formula [Aspirin-Acetaminophen-Caffeine] Rash    Midrina; broke out in rash   . Povidone Iodine Rash    I personally reviewed active problem list, medication list, allergies, family history, social history, health maintenance with the patient/caregiver today.   ROS  Constitutional: Negative for fever or weight change.  Respiratory: Negative for cough and shortness of breath.   Cardiovascular: Negative for chest pain or palpitations.  Gastrointestinal: Negative for abdominal pain, no bowel changes.  Musculoskeletal: Negative for gait problem or joint swelling.  Skin: positive  for rash.  Neurological: Negative for dizziness or headache.  No other specific complaints in a complete review of systems (except as listed in HPI above).  Objective  Vitals:   01/14/21 1028  BP: 118/80  Pulse: 85  Resp: 16  Temp: 98 F (36.7 C)  TempSrc: Oral  SpO2: 99%  Weight: 177 lb (80.3 kg)  Height: 5\' 2"  (1.575 m)    Body mass index is 32.37 kg/m.  Physical Exam  Constitutional: Patient appears well-developed and well-nourished. Obese  No distress.  HEENT: head atraumatic, normocephalic, pupils equal and reactive to light,  neck supple Cardiovascular: Normal rate, regular rhythm and normal heart sounds.  No murmur heard. No BLE edema. Pulmonary/Chest: Effort normal and breath sounds normal. No respiratory distress. Abdominal: Soft.  There is no tenderness. Skin: healing lesion on ankles Psychiatric: Patient has a normal mood and affect. behavior is  normal. Judgment and thought content normal.  PHQ2/9: Depression screen [facility] 2/9 01/14/2021 09/03/2020 08/25/2020 06/19/2020 05/01/2020  Decreased Interest 0 0 0 0 0  Down, Depressed, Hopeless 0 0 0 0 0  PHQ - 2 Score 0 0 0 0 0  Altered sleeping - - - - 0  Tired, decreased energy - - - - 0  Change in appetite - - - - 0  Feeling bad or failure about yourself  - - - - 0  Trouble concentrating - - - - 0  Moving slowly or fidgety/restless - - - - 0  Suicidal thoughts - - - - 0  PHQ-9 Score - - - - 0  Difficult doing work/chores - - - - Not difficult at all  Some recent data might be hidden    phq 9 is negative  Fall Risk: Fall Risk  01/14/2021 09/03/2020 08/25/2020 06/19/2020 05/01/2020  Falls in the past year? 0  0 0 0 0  Number falls in past yr: 0 0 0 0 0  Injury with Fall? 0 0 0 0 0  Follow up - Falls evaluation completed Falls evaluation completed Falls evaluation completed -     Functional Status Survey: Is the patient deaf or have difficulty hearing?: No Does the patient have difficulty seeing, even when wearing glasses/contacts?: No Does the patient have difficulty concentrating, remembering, or making decisions?: No Does the patient have difficulty walking or climbing stairs?: No Does the patient have difficulty dressing or bathing?: No Does the patient have difficulty doing errands alone such as visiting a doctor's office or shopping?: No   Assessment & Plan  1. Hypertension goal BP (blood pressure) < 140/90  - olmesartan-hydrochlorothiazide (BENICAR HCT) 40-25 MG tablet; Take 1 tablet by mouth daily. In place of chlorthalidone and losartan  Dispense: 90 tablet; Refill: 0 - Potassium  2. Hypokalemia  - Potassium  3. Seasonal allergies  Continue current regiment   4. Insect bite of ankle, unspecified laterality, subsequent encounter  Reassurance given, advised to use hydrocortisone for itching if needed   5. GERD without esophagitis

## 2021-01-14 ENCOUNTER — Encounter: Payer: Self-pay | Admitting: Family Medicine

## 2021-01-14 ENCOUNTER — Ambulatory Visit (INDEPENDENT_AMBULATORY_CARE_PROVIDER_SITE_OTHER): Payer: BC Managed Care – PPO | Admitting: Family Medicine

## 2021-01-14 ENCOUNTER — Other Ambulatory Visit: Payer: Self-pay

## 2021-01-14 VITALS — BP 118/80 | HR 85 | Temp 98.0°F | Resp 16 | Ht 62.0 in | Wt 177.0 lb

## 2021-01-14 DIAGNOSIS — I10 Essential (primary) hypertension: Secondary | ICD-10-CM

## 2021-01-14 DIAGNOSIS — W57XXXD Bitten or stung by nonvenomous insect and other nonvenomous arthropods, subsequent encounter: Secondary | ICD-10-CM

## 2021-01-14 DIAGNOSIS — E876 Hypokalemia: Secondary | ICD-10-CM

## 2021-01-14 DIAGNOSIS — S90569D Insect bite (nonvenomous), unspecified ankle, subsequent encounter: Secondary | ICD-10-CM | POA: Diagnosis not present

## 2021-01-14 DIAGNOSIS — K219 Gastro-esophageal reflux disease without esophagitis: Secondary | ICD-10-CM

## 2021-01-14 DIAGNOSIS — J302 Other seasonal allergic rhinitis: Secondary | ICD-10-CM

## 2021-01-14 MED ORDER — OLMESARTAN MEDOXOMIL-HCTZ 40-25 MG PO TABS: 1.0000 | ORAL_TABLET | Freq: Every day | ORAL | 0 refills | Status: DC

## 2021-01-15 LAB — POTASSIUM: Potassium: 3.4 mmol/L — ABNORMAL LOW (ref 3.5–5.3)

## 2021-02-09 ENCOUNTER — Encounter: Payer: Self-pay | Admitting: Family Medicine

## 2021-02-09 ENCOUNTER — Ambulatory Visit (INDEPENDENT_AMBULATORY_CARE_PROVIDER_SITE_OTHER): Payer: BC Managed Care – PPO | Admitting: Family Medicine

## 2021-02-09 ENCOUNTER — Other Ambulatory Visit: Payer: Self-pay

## 2021-02-09 VITALS — BP 124/72 | HR 83 | Temp 98.0°F | Resp 16 | Ht 62.0 in | Wt 182.5 lb

## 2021-02-09 DIAGNOSIS — E876 Hypokalemia: Secondary | ICD-10-CM | POA: Diagnosis not present

## 2021-02-09 DIAGNOSIS — E782 Mixed hyperlipidemia: Secondary | ICD-10-CM

## 2021-02-09 DIAGNOSIS — E669 Obesity, unspecified: Secondary | ICD-10-CM

## 2021-02-09 DIAGNOSIS — R7303 Prediabetes: Secondary | ICD-10-CM

## 2021-02-09 DIAGNOSIS — I10 Essential (primary) hypertension: Secondary | ICD-10-CM

## 2021-02-09 DIAGNOSIS — N179 Acute kidney failure, unspecified: Secondary | ICD-10-CM | POA: Diagnosis not present

## 2021-02-09 DIAGNOSIS — E559 Vitamin D deficiency, unspecified: Secondary | ICD-10-CM | POA: Diagnosis not present

## 2021-02-09 NOTE — Progress Notes (Signed)
Name: Regina Dunlap   MRN: [id number]    DOB: 1961-05-04   Date:02/09/2021       Progress Note  Chief Complaint  Patient presents with  . Hypertension     Subjective:   Regina Dunlap is a 60 y.o. female, presents to clinic for f/up on HTN Hx of low potassium and last labs showed decrease in GFR  Last week she switched BP meds and stopped potassium supplement  Hypertension:  Currently managed on olmesartan-HCTZ 40-25 (previously on losartan and chlorthalidone)  Pt reports good med compliance and denies any SE.   Blood pressure today is well controlled. BP Readings from Last 3 Encounters:  02/09/21 124/72  01/14/21 118/80  10/26/20 113/61   Pt denies CP, SOB, exertional sx, LE edema, palpitation, Ha's, visual disturbances, lightheadedness, hypotension, syncope. Dietary efforts for BP?  Trying to eat healthier   Hyperlipidemia: Currently treated with simvastatin 20, pt reports good med compliance Last Lipids: Lab Results  Component Value Date   CHOL 174 08/05/2020   HDL 51 08/05/2020   LDLCALC 93 08/05/2020   TRIG 198 (H) 08/05/2020   CHOLHDL 3.4 08/05/2020   - Denies: Chest pain, shortness of breath, myalgias, claudication  Prediabetes -  Lab Results  Component Value Date   HGBA1C 6.2 (H) 08/05/2020   GERD - using omeprazole PRN, not daily  On ASA some days of the week, no past primary CVevents   Current Outpatient Medications:  .  aspirin 81 MG tablet, Take 81 mg by mouth daily., Disp: , Rfl:  .  calcium gluconate 500 MG tablet, Take 1 tablet by mouth 3 (three) times daily., Disp: , Rfl:  .  cholecalciferol (VITAMIN D3) 25 MCG (1000 UNIT) tablet, Take 5,000 Units by mouth daily., Disp: , Rfl:  .  fluticasone (FLONASE) 50 MCG/ACT nasal spray, Place 2 sprays into both nostrils daily. (Patient taking differently: Place 2 sprays into both nostrils daily as needed.), Disp: 16 g, Rfl: 6 .  Krill Oil 1000 MG CAPS, Take 1 capsule daily by mouth. ,  Disp: , Rfl:  .  loratadine (CLARITIN) 10 MG tablet, Take 10 mg by mouth daily., Disp: , Rfl:  .  Magnesium 100 MG CAPS, Take 1,000 mg by mouth., Disp: , Rfl:  .  Multiple Vitamins-Minerals (HAIR/SKIN/NAILS/BIOTIN) TABS, Take by mouth., Disp: , Rfl:  .  olmesartan-hydrochlorothiazide (BENICAR HCT) 40-25 MG tablet, Take 1 tablet by mouth daily. In place of chlorthalidone and losartan, Disp: 90 tablet, Rfl: 0 .  potassium chloride (KLOR-CON) 10 MEQ tablet, Take 10 mEq by mouth daily., Disp: , Rfl:  .  simvastatin (ZOCOR) 20 MG tablet, Take 1 tablet (20 mg total) by mouth at bedtime., Disp: 90 tablet, Rfl: 3 .  TURMERIC PO, Take by mouth daily. Reported on 03/28/2016, Disp: , Rfl:  .  vitamin B-12 (CYANOCOBALAMIN) 100 MCG tablet, Take 100 mcg by mouth daily., Disp: , Rfl:   Patient Active Problem List   Diagnosis Date Noted  . Vitamin D deficiency 05/01/2020  . Hot flashes 07/15/2019  . Varicose veins of leg with pain, left 04/23/2019  . Hypokalemia 11/23/2017  . Postmenopausal bleeding 10/17/2017  . Varicose veins of both lower extremities without ulcer or inflammation 04/27/2017  . Sleep apnea 04/24/2017  . Encounter for screening colonoscopy 04/24/2017  . Obesity (BMI 30.0-34.9) 11/04/2016  . Fatty liver 11/04/2016  . History of concussion 06/07/2016  . MCI (mild cognitive impairment) 06/07/2016  . Prediabetes 03/28/2016  . Memory impairment 03/28/2016  .  Decreased sense of smell 03/28/2016  . Hypercholesterolemia with hypertriglyceridemia 10/13/2015  . Encounter for screening mammogram for malignant neoplasm of breast 10/13/2015  . GERD without esophagitis 10/13/2015  . Screening for STD (sexually transmitted disease) 10/13/2015  . Allergic rhinitis 09/16/2015  . Lipoma of arm 09/16/2015  . Family history of colon cancer 09/16/2015  . Hypertension goal BP (blood pressure) < 140/90     Past Surgical History:  Procedure Laterality Date  . BREAST BIOPSY Right 1999   neg  . BREAST  CYST ASPIRATION Right    neg  . COLONOSCOPY WITH PROPOFOL N/A 08/07/2020   Procedure: COLONOSCOPY WITH PROPOFOL;  Surgeon: Lucilla Lame, MD;  Location: [place];  Service: Endoscopy;  Laterality: N/A;  Sleep apnea priority 4  . TUBAL LIGATION      Family History  Problem Relation Age of Onset  . Migraines Mother   . Stroke Father   . Hypertension Father   . Hypertension Sister   . Anuerysm Brother   . Hypertension Sister   . Cancer Sister        cervical  . Cancer Sister        colon  . Breast cancer Neg Hx     Social History   Tobacco Use  . Smoking status: Never Smoker  . Smokeless tobacco: Never Used  Vaping Use  . Vaping Use: Never used  Substance Use Topics  . Alcohol use: Yes    Comment: social  . Drug use: No     Allergies  Allergen Reactions  . Ciprofloxacin Diarrhea and Nausea And Vomiting  . Lisinopril Cough  . Migraine Formula [Aspirin-Acetaminophen-Caffeine] Rash    Midrina; broke out in rash   . Povidone Iodine Rash    Health Maintenance  Topic Date Due  . COVID-19 Vaccine (4 - Booster for Pfizer series) 04/10/2021  . INFLUENZA VACCINE  04/26/2021  . MAMMOGRAM  07/15/2021  . PAP SMEAR-Modifier  06/20/2023  . COLONOSCOPY (Pts 45-59yrs Insurance coverage will need to be confirmed)  08/07/2025  . TETANUS/TDAP  11/04/2026  . Hepatitis C Screening  Completed  . HIV Screening  Completed  . HPV VACCINES  Aged Out    Chart Review Today: I personally reviewed active problem list, medication list, allergies, family history, social history, health maintenance, notes from last encounter, lab results, imaging with the patient/caregiver today.   Review of Systems  Constitutional: Negative.   HENT: Negative.   Eyes: Negative.   Respiratory: Negative.   Cardiovascular: Negative.   Gastrointestinal: Negative.   Endocrine: Negative.   Genitourinary: Negative.   Musculoskeletal: Negative.   Skin: Negative.   Allergic/Immunologic: Negative.    Neurological: Negative.   Hematological: Negative.   Psychiatric/Behavioral: Negative.   All other systems reviewed and are negative.      Objective:   Vitals:   02/09/21 1437  BP: 124/72  Pulse: 83  Resp: 16  Temp: 98 F (36.7 C)  SpO2: 97%  Weight: 182 lb 8 oz (82.8 kg)  Height: 5\' 2"  (1.575 m)    Body mass index is 33.38 kg/m.  Physical Exam Vitals and nursing note reviewed.  Constitutional:      General: She is not in acute distress.    Appearance: Normal appearance. She is obese. She is not ill-appearing, toxic-appearing or diaphoretic.  HENT:     Head: Normocephalic and atraumatic.     Right Ear: External ear normal.     Left Ear: External ear normal.  Eyes:  General:        Right eye: No discharge.        Left eye: No discharge.     Conjunctiva/sclera: Conjunctivae normal.  Cardiovascular:     Rate and Rhythm: Normal rate and regular rhythm.     Pulses: Normal pulses.     Heart sounds: Normal heart sounds. No murmur heard. No friction rub. No gallop.   Pulmonary:     Effort: Pulmonary effort is normal. No respiratory distress.     Breath sounds: Normal breath sounds. No wheezing or rales.  Abdominal:     General: Bowel sounds are normal.     Palpations: Abdomen is soft.  Skin:    General: Skin is warm and dry.     Coloration: Skin is not jaundiced or pale.  Neurological:     Mental Status: She is alert. Mental status is at baseline.     Cranial Nerves: No cranial nerve deficit.  Psychiatric:        Mood and Affect: Mood normal.        Behavior: Behavior normal.         Assessment & Plan:     ICD-10-CM   1. Hypertension goal BP (blood pressure) < 140/90  U63 COMPLETE METABOLIC PANEL WITH GFR   stable, well controlled, BP at goal today, however she has had persistant hypokalemia going back years - recent change in meds, recheck labs today  2. Hypokalemia  F35.4 COMPLETE METABOLIC PANEL WITH GFR   HTN meds changed, recheck potassium   3. AKI (acute kidney injury) ([place])  T62.5 COMPLETE METABOLIC PANEL WITH GFR   GFR was more than 25% decreased with last labs, recheck  4. Hypercholesterolemia with hypertriglyceridemia  W38.9 COMPLETE METABOLIC PANEL WITH GFR   good statin compliance, last lipids at goal, reviewed today, tolerating statin well, healthy diet efforts for HTN and HLD, lipids due in 6 months  5. Vitamin D deficiency  H73.4 COMPLETE METABOLIC PANEL WITH GFR    VITAMIN D 25 Hydroxy (Vit-D Deficiency, Fractures)  6. Prediabetes  K87.68 COMPLETE METABOLIC PANEL WITH GFR    Hemoglobin A1c  7. Obesity (BMI 30.0-34.9)  E66.9 CBC with Differential/Platelet    COMPLETE METABOLIC PANEL WITH GFR    Hemoglobin A1c    VITAMIN D 25 Hydroxy (Vit-D Deficiency, Fractures)     Return in about 6 months (around 08/12/2021) for Routine follow-up.   Delsa Grana, PA-C 02/09/21 2:49 PM

## 2021-02-10 LAB — COMPLETE METABOLIC PANEL WITH GFR
AG Ratio: 1.7 (calc) (ref 1.0–2.5)
ALT: 21 U/L (ref 6–29)
AST: 21 U/L (ref 10–35)
Albumin: 4.4 g/dL (ref 3.6–5.1)
Alkaline phosphatase (APISO): 81 U/L (ref 37–153)
BUN: 17 mg/dL (ref 7–25)
CO2: 31 mmol/L (ref 20–32)
Calcium: 9.1 mg/dL (ref 8.6–10.4)
Chloride: 102 mmol/L (ref 98–110)
Creat: 0.75 mg/dL (ref 0.50–1.05)
GFR, Est African American: 103 mL/min/{1.73_m2} (ref >=60)
GFR, Est Non African American: 88 mL/min/{1.73_m2} (ref >=60)
Globulin: 2.6 g/dL (calc) (ref 1.9–3.7)
Glucose, Bld: 84 mg/dL (ref 65–99)
Potassium: 3.5 mmol/L (ref 3.5–5.3)
Sodium: 141 mmol/L (ref 135–146)
Total Bilirubin: 0.4 mg/dL (ref 0.2–1.2)
Total Protein: 7 g/dL (ref 6.1–8.1)

## 2021-02-10 LAB — CBC WITH DIFFERENTIAL/PLATELET
Absolute Monocytes: 378 cells/uL (ref 200–950)
Basophils Absolute: 50 cells/uL (ref 0–200)
Basophils Relative: 0.8 %
Eosinophils Absolute: 211 cells/uL (ref 15–500)
Eosinophils Relative: 3.4 %
HCT: 38.9 % (ref 35.0–45.0)
Hemoglobin: 12.7 g/dL (ref 11.7–15.5)
Lymphs Abs: 2468 cells/uL (ref 850–3900)
MCH: 28.5 pg (ref 27.0–33.0)
MCHC: 32.6 g/dL (ref 32.0–36.0)
MCV: 87.4 fL (ref 80.0–100.0)
MPV: 10.7 fL (ref 7.5–12.5)
Monocytes Relative: 6.1 %
Neutro Abs: 3094 cells/uL (ref 1500–7800)
Neutrophils Relative %: 49.9 %
Platelets: 227 10*3/uL (ref 140–400)
RBC: 4.45 10*6/uL (ref 3.80–5.10)
RDW: 13.3 % (ref 11.0–15.0)
Total Lymphocyte: 39.8 %
WBC: 6.2 10*3/uL (ref 3.8–10.8)

## 2021-02-10 LAB — HEMOGLOBIN A1C
Hgb A1c MFr Bld: 6.1 % of total Hgb — ABNORMAL HIGH (ref <5.7)
Mean Plasma Glucose: 128 mg/dL
eAG (mmol/L): 7.1 mmol/L

## 2021-02-10 LAB — VITAMIN D 25 HYDROXY (VIT D DEFICIENCY, FRACTURES): Vit D, 25-Hydroxy: 26 ng/mL — ABNORMAL LOW (ref 30–100)

## 2021-02-27 ENCOUNTER — Other Ambulatory Visit: Payer: Self-pay | Admitting: Family Medicine

## 2021-03-10 ENCOUNTER — Encounter: Payer: Self-pay | Admitting: Family Medicine

## 2021-03-11 ENCOUNTER — Encounter: Payer: Self-pay | Admitting: Family Medicine

## 2021-03-26 ENCOUNTER — Ambulatory Visit: Payer: BC Managed Care – PPO | Admitting: Family Medicine

## 2021-03-26 ENCOUNTER — Encounter: Payer: Self-pay | Admitting: Family Medicine

## 2021-03-31 ENCOUNTER — Encounter: Payer: Self-pay | Admitting: Family Medicine

## 2021-03-31 ENCOUNTER — Other Ambulatory Visit: Payer: Self-pay

## 2021-03-31 ENCOUNTER — Ambulatory Visit (INDEPENDENT_AMBULATORY_CARE_PROVIDER_SITE_OTHER): Payer: BC Managed Care – PPO | Admitting: Family Medicine

## 2021-03-31 VITALS — BP 124/82 | HR 89 | Temp 98.2°F | Resp 16 | Ht 62.0 in | Wt 182.4 lb

## 2021-03-31 DIAGNOSIS — N63 Unspecified lump in unspecified breast: Secondary | ICD-10-CM | POA: Diagnosis not present

## 2021-03-31 NOTE — Patient Instructions (Signed)
It was great to see you! Your breast lump is very consistent with fibrocystic tissue. If the tissue changes, grows, becomes firm, immobile, let us know. Continue to stay up to date with your mammogram.  Take care and seek immediate care sooner if you develop any concerns.   Dr. Ky Barban

## 2021-03-31 NOTE — Progress Notes (Signed)
    SUBJECTIVE:   CHIEF COMPLAINT / HPI:   BREAST LUMP Duration :weeks Location: left Redness: no Swelling: no Trauma: no trauma Breastfeeding: no Associated with menstral cycle: no Nipple discharge: no Previous mammogram: yes, normal 06/2020 No FH of breast cancer.    OBJECTIVE:   BP 124/82   Pulse 89   Temp 98.2 F (36.8 C) (Oral)   Resp 16   Ht 5\' 2"  (1.575 m)   Wt 182 lb 6.4 oz (82.7 kg)   SpO2 97%   BMI 33.36 kg/m   Gen: well appearing, in NAD Breasts: breasts appear normal, no suspicious masses, no skin or nipple changes or axillary nodes, symmetric fibrous changes in both upper outer quadrants.  ASSESSMENT/PLAN:   Breast lump Consistent with fibrocystic tissue, present in b/l breasts. No findings to concern for malignancy, offered follow up imaging but patient elects to hold off for now. Has annual screening mammogram upcoming in October. Reviewed red flag symptoms and encouraged f/u if symptoms change.   Myles Gip, DO

## 2021-04-02 ENCOUNTER — Other Ambulatory Visit: Payer: Self-pay | Admitting: Family Medicine

## 2021-04-02 DIAGNOSIS — E782 Mixed hyperlipidemia: Secondary | ICD-10-CM

## 2021-04-02 DIAGNOSIS — I10 Essential (primary) hypertension: Secondary | ICD-10-CM

## 2021-04-21 ENCOUNTER — Ambulatory Visit: Payer: Self-pay | Admitting: *Deleted

## 2021-04-21 NOTE — Telephone Encounter (Signed)
Lvm to sch virtual visit.Regina Dunlap is out of the office so it is okay to sch with Regina Dunlap

## 2021-04-21 NOTE — Telephone Encounter (Signed)
Pt reports tested positive for covid last Thursday 04/15/21. States "Felt better after 3-4 days, no symptoms and tested negative."  States this am woke with body aches, subjective fever, sneezing, slight headache, mild dry cough; home test positive. Denies SOB, no CP or wheezing. Reviewed guidelines for self isolation as well as symptoms that warrant an ED eval.  Home care advise given.  Pt is interested in oral anti-viral; aware needs to be started within 5 days of symptom onset.  Assured pt NT would route to practice for PCPs review and final disposition.  Pt verbalizes understanding. Please advise   CB# [phone] or [phone]

## 2021-04-21 NOTE — Telephone Encounter (Signed)
Reason for Disposition  [1] COVID-19 diagnosed by positive lab test (e.g., PCR, rapid self-test kit) AND [2] mild symptoms (e.g., cough, fever, others) AND [1] no complications or SOB  Answer Assessment - Initial Assessment Questions 1. COVID-19 DIAGNOSIS: "Who made your COVID-19 diagnosis?" "Was it confirmed by a positive lab test or self-test?" If not diagnosed by a doctor (or NP/PA), ask "Are there lots of cases (community spread) where you live?" Note: See public health department website, if unsure.     This AM, home test 2. COVID-19 EXPOSURE: "Was there any known exposure to COVID before the symptoms began?" CDC Definition of close contact: within 6 feet (2 meters) for a total of 15 minutes or more over a 24-hour period.      Members in household 3. ONSET: "When did the COVID-19 symptoms start?"      This AM 4. WORST SYMPTOM: "What is your worst symptom?" (e.g., cough, fever, shortness of breath, muscle aches)      5. COUGH: "Do you have a cough?" If Yes, ask: "How bad is the cough?"       Mild, dry cough 6. FEVER: "Do you have a fever?" If Yes, ask: "What is your temperature, how was it measured, and when did it start?"     Subjective fever, "Feel warm" 7. RESPIRATORY STATUS: "Describe your breathing?" (e.g., shortness of breath, wheezing, unable to speak)      WNL 8. BETTER-SAME-WORSE: "Are you getting better, staying the same or getting worse compared to yesterday?"  If getting worse, ask, "In what way?"     worse 9. HIGH RISK DISEASE: "Do you have any chronic medical problems?" (e.g., asthma, heart or lung disease, weak immune system, obesity, etc.)      10. VACCINE: "Have you had the COVID-19 vaccine?" If Yes, ask: "Which one, how many shots, when did you get it?"       Yes, 2 pfizer. 11. BOOSTER: "Have you received your COVID-19 booster?" If Yes, ask: "Which one and when did you get it?"       Yes last booster, 01/19/21 [place].  13. OTHER SYMPTOMS: "Do you have any other  symptoms?"  (e.g., chills, fatigue, headache, loss of smell or taste, muscle pain, sore throat)       Headache, body aches, runny nose, sneezing, mild sore throat 14. O2 SATURATION MONITOR:  "Do you use an oxygen saturation monitor (pulse oximeter) at home?" If Yes, ask "What is your reading (oxygen level) today?" "What is your usual oxygen saturation reading?" (e.g., 95%)       *No Answer*  Protocols used: Coronavirus (COVID-19) Diagnosed or Suspected-A-AH

## 2021-04-22 ENCOUNTER — Encounter: Payer: Self-pay | Admitting: Unknown Physician Specialty

## 2021-04-22 ENCOUNTER — Telehealth (INDEPENDENT_AMBULATORY_CARE_PROVIDER_SITE_OTHER): Payer: BC Managed Care – PPO | Admitting: Unknown Physician Specialty

## 2021-04-22 ENCOUNTER — Other Ambulatory Visit: Payer: Self-pay

## 2021-04-22 ENCOUNTER — Telehealth: Payer: BC Managed Care – PPO | Admitting: Unknown Physician Specialty

## 2021-04-22 VITALS — BP 112/88 | HR 66

## 2021-04-22 DIAGNOSIS — U071 COVID-19: Secondary | ICD-10-CM

## 2021-04-22 NOTE — Progress Notes (Signed)
BP 112/88   Pulse 66    Subjective:    Patient ID: Regina Dunlap, female    DOB: [birth year], 59 y.o.   MRN: [id number]  HPI: Regina Dunlap is a 60 y.o. female  Chief Complaint  Patient presents with   Covid Positive    For 1 week, headache, stuffy nose, diarrhea, bodyaches, fever   This visit was completed via telephone due to the restrictions of the COVID-19 pandemic. All issues as above were discussed and addressed but no physical exam was performed. If it was felt that the patient should be evaluated in the office, they were directed there. The patient verbally consented to this visit. Patient was unable to complete an audio/visual visit due to Lack of equipment. Location of the patient: home Location of the provider: work Those involved with this call:  Time spent on call: 20 minutes I verified patient identity using two factors (patient name and date of birth). Patient consents verbally to being seen via telemedicine visit today.   URI  This is a new problem. Episode onset: 8 days ago started with sinus headache. Maximum temperature: Having chills.  Not sure how high. Associated symptoms include congestion, coughing, diarrhea, headaches, joint pain, rhinorrhea and sinus pain. Pertinent negatives include no joint swelling, nausea, neck pain, rash, sore throat or vomiting. Associated symptoms comments: Body aches. She has tried acetaminophen and decongestant (hot showers) for the symptoms. The treatment provided mild relief.   Relevant past medical, surgical, family and social history reviewed and updated as indicated. Interim medical history since our last visit reviewed. Allergies and medications reviewed and updated.  Review of Systems  HENT:  Positive for congestion, rhinorrhea and sinus pain. Negative for sore throat.   Respiratory:  Positive for cough.   Gastrointestinal:  Positive for diarrhea. Negative for nausea and vomiting.  Musculoskeletal:  Positive  for joint pain. Negative for neck pain.  Skin:  Negative for rash.  Neurological:  Positive for headaches.   Per HPI unless specifically indicated above     Objective:    BP 112/88   Pulse 66   Wt Readings from Last 3 Encounters:  03/31/21 182 lb 6.4 oz (82.7 kg)  02/09/21 182 lb 8 oz (82.8 kg)  01/14/21 177 lb (80.3 kg)    Physical Exam Neurological:     General: No focal deficit present.     Mental Status: She is alert and oriented to person, place, and time.    Results for orders placed or performed in visit on 02/09/21  CBC with Differential/Platelet  Result Value Ref Range   WBC 6.2 3.8 - 10.8 Thousand/uL   RBC 4.45 3.80 - 5.10 Million/uL   Hemoglobin 12.7 11.7 - 15.5 g/dL   HCT 38.9 35.0 - 45.0 %   MCV 87.4 80.0 - 100.0 fL   MCH 28.5 27.0 - 33.0 pg   MCHC 32.6 32.0 - 36.0 g/dL   RDW 13.3 11.0 - 15.0 %   Platelets 227 140 - 400 Thousand/uL   MPV 10.7 7.5 - 12.5 fL   Neutro Abs 3,094 1,500 - 7,800 cells/uL   Lymphs Abs 2,468 850 - 3,900 cells/uL   Absolute Monocytes 378 200 - 950 cells/uL   Eosinophils Absolute 211 15 - 500 cells/uL   Basophils Absolute 50 0 - 200 cells/uL   Neutrophils Relative % 49.9 %   Total Lymphocyte 39.8 %   Monocytes Relative 6.1 %   Eosinophils Relative 3.4 %   Basophils Relative  0.8 %  COMPLETE METABOLIC PANEL WITH GFR  Result Value Ref Range   Glucose, Bld 84 65 - 99 mg/dL   BUN 17 7 - 25 mg/dL   Creat 0.75 0.50 - 1.05 mg/dL   GFR, Est Non African American 88 > OR = 60 mL/min/1.46m   GFR, Est African American 103 > OR = 60 mL/min/1.736m  BUN/Creatinine Ratio NOT APPLICABLE 6 - 22 (calc)   Sodium 141 135 - 146 mmol/L   Potassium 3.5 3.5 - 5.3 mmol/L   Chloride 102 98 - 110 mmol/L   CO2 31 20 - 32 mmol/L   Calcium 9.1 8.6 - 10.4 mg/dL   Total Protein 7.0 6.1 - 8.1 g/dL   Albumin 4.4 3.6 - 5.1 g/dL   Globulin 2.6 1.9 - 3.7 g/dL (calc)   AG Ratio 1.7 1.0 - 2.5 (calc)   Total Bilirubin 0.4 0.2 - 1.2 mg/dL   Alkaline  phosphatase (APISO) 81 37 - 153 U/L   AST 21 10 - 35 U/L   ALT 21 6 - 29 U/L  Hemoglobin A1c  Result Value Ref Range   Hgb A1c MFr Bld 6.1 (H) <5.7 % of total Hgb   Mean Plasma Glucose 128 mg/dL   eAG (mmol/L) 7.1 mmol/L  VITAMIN D 25 Hydroxy (Vit-D Deficiency, Fractures)  Result Value Ref Range   Vit D, 25-Hydroxy 26 (L) 30 - 100 ng/mL      Assessment & Plan:   Problem List Items Addressed This Visit   None Visit Diagnoses     COVID-19    -  Primary       Recommended continue quarantine until symptoms resolving.  The following supportive care recommendations given: Zinc once daily in addition to combination product Vit D 800 IUs Vit C 3 times a day Melatonin 10 mg QHS Quercetin twice a day Elderberry Black cumin seed Tylenol   Follow up plan: Return if symptoms worsen or fail to improve.

## 2021-04-22 NOTE — Patient Instructions (Signed)
Zinc once daily in addition to combination product Vit D 800 IUs Vit C 3 times a day Melatonin 10 mg QHS Quercetin twice a day Elderberry Black cumin seed Tylenol

## 2021-04-26 ENCOUNTER — Ambulatory Visit: Payer: BC Managed Care – PPO | Admitting: Family Medicine

## 2021-05-04 ENCOUNTER — Encounter: Payer: Self-pay | Admitting: Family Medicine

## 2021-07-01 ENCOUNTER — Other Ambulatory Visit: Payer: Self-pay | Admitting: Family Medicine

## 2021-07-01 DIAGNOSIS — I10 Essential (primary) hypertension: Secondary | ICD-10-CM

## 2021-07-01 NOTE — Telephone Encounter (Signed)
Requested Prescriptions  Pending Prescriptions Disp Refills  . olmesartan-hydrochlorothiazide (BENICAR HCT) 40-25 MG tablet [Pharmacy Med Name: OLMESARTAN MEDOX/HCTZ 40-25MG  TAB] 90 tablet 0    Sig: TAKE 1 TABLET BY MOUTH DAILY. DISCONTINUE CHLORTHALIDONE AND LOSARTAN     Cardiovascular: ARB + Diuretic Combos Passed - 07/01/2021  3:38 AM      Passed - K in normal range and within 180 days    Potassium  Date Value Ref Range Status  02/09/2021 3.5 3.5 - 5.3 mmol/L Final         Passed - Na in normal range and within 180 days    Sodium  Date Value Ref Range Status  02/09/2021 141 135 - 146 mmol/L Final  10/13/2015 145 (H) 134 - 144 mmol/L Final         Passed - Cr in normal range and within 180 days    Creat  Date Value Ref Range Status  02/09/2021 0.75 0.50 - 1.05 mg/dL Final    Comment:    For patients >26 years of age, the reference limit for Creatinine is approximately 13% higher for people identified as African-American. .          Passed - Ca in normal range and within 180 days    Calcium  Date Value Ref Range Status  02/09/2021 9.1 8.6 - 10.4 mg/dL Final         Passed - Patient is not pregnant      Passed - Last BP in normal range    BP Readings from Last 1 Encounters:  04/22/21 112/88         Passed - Valid encounter within last 6 months    Recent Outpatient Visits          2 months ago [place], NP   3 months ago Breast lump   [place], DO   4 months ago Hypertension goal BP (blood pressure) < 140/90   [facility] [place], Kristeen Miss, PA-C   5 months ago Hypertension goal BP (blood pressure) < 140/90   [facility] [place] Sizer, MD   10 months ago Hypokalemia   [facility] Delsa Grana, PA-C      Future Appointments            In 1 month Delsa Grana, PA-C [facility], [facility]

## 2021-07-06 ENCOUNTER — Ambulatory Visit (INDEPENDENT_AMBULATORY_CARE_PROVIDER_SITE_OTHER): Payer: BC Managed Care – PPO

## 2021-07-06 ENCOUNTER — Other Ambulatory Visit: Payer: Self-pay

## 2021-07-06 DIAGNOSIS — Z23 Encounter for immunization: Secondary | ICD-10-CM | POA: Diagnosis not present

## 2021-07-06 NOTE — Progress Notes (Signed)
Patient tolerated flu vaccine well with no immediate adverse reaction noted.

## 2021-07-12 ENCOUNTER — Other Ambulatory Visit: Payer: Self-pay

## 2021-07-12 ENCOUNTER — Telehealth: Payer: Self-pay

## 2021-07-12 DIAGNOSIS — Z1231 Encounter for screening mammogram for malignant neoplasm of breast: Secondary | ICD-10-CM

## 2021-07-12 NOTE — Telephone Encounter (Signed)
Copied from [place] [id number]. Topic: General - Other >> Jul 12, 2021 12:39 PM Loma [place] wrote: Reason for CRM: pt is wanting an order to be sent in for a mammogram to be done at [facility] location Annual due after 07/15/20

## 2021-07-12 NOTE — Telephone Encounter (Signed)
Pt was notified mammogram has been submitted for [place] location.

## 2021-07-13 ENCOUNTER — Ambulatory Visit: Payer: BC Managed Care – PPO | Admitting: Nurse Practitioner

## 2021-08-10 ENCOUNTER — Ambulatory Visit: Payer: BC Managed Care – PPO | Admitting: Family Medicine

## 2021-08-12 ENCOUNTER — Ambulatory Visit (INDEPENDENT_AMBULATORY_CARE_PROVIDER_SITE_OTHER): Payer: BC Managed Care – PPO | Admitting: Unknown Physician Specialty

## 2021-08-12 ENCOUNTER — Encounter: Payer: Self-pay | Admitting: Unknown Physician Specialty

## 2021-08-12 ENCOUNTER — Other Ambulatory Visit: Payer: Self-pay

## 2021-08-12 VITALS — BP 136/82 | HR 63 | Temp 98.0°F | Resp 16 | Ht 62.0 in | Wt 181.2 lb

## 2021-08-12 DIAGNOSIS — J302 Other seasonal allergic rhinitis: Secondary | ICD-10-CM | POA: Diagnosis not present

## 2021-08-12 DIAGNOSIS — E782 Mixed hyperlipidemia: Secondary | ICD-10-CM

## 2021-08-12 DIAGNOSIS — R944 Abnormal results of kidney function studies: Secondary | ICD-10-CM | POA: Diagnosis not present

## 2021-08-12 DIAGNOSIS — R0981 Nasal congestion: Secondary | ICD-10-CM

## 2021-08-12 DIAGNOSIS — I10 Essential (primary) hypertension: Secondary | ICD-10-CM

## 2021-08-12 MED ORDER — FLUTICASONE PROPIONATE 50 MCG/ACT NA SUSP: 2.0000 | Freq: Every day | NASAL | 6 refills | Status: DC

## 2021-08-12 MED ORDER — SIMVASTATIN 20 MG PO TABS: 20.0000 mg | ORAL_TABLET | Freq: Every day | ORAL | 3 refills | Status: DC

## 2021-08-12 MED ORDER — OLMESARTAN MEDOXOMIL-HCTZ 40-25 MG PO TABS: 1.0000 | ORAL_TABLET | Freq: Every day | ORAL | 3 refills | Status: DC

## 2021-08-12 NOTE — Progress Notes (Signed)
BP 136/82   Pulse 63   Temp 98 F (36.7 C) (Oral)   Resp 16   Ht 5\' 2"  (1.575 m)   Wt 181 lb 3.2 oz (82.2 kg)   SpO2 99%   BMI 33.14 kg/m    Subjective:    Patient ID: Regina Dunlap, female    DOB: 1961-05-14, 60 y.o.   MRN: [id number]  HPI: Regina Dunlap is a 60 y.o. female  Chief Complaint  Patient presents with   Hypertension   Hyperlipidemia   Hypertension Using medications without difficulty Average home BPs does not check  No problems or lightheadedness No chest pain with exertion or shortness of breath No Edema Cramping of hands at night Hx of hypokalemia   Hyperlipidemia Using medications without problems: No Muscle aches  Diet compliance:Good diet Exercise:Walks  Wondering about ashwaghanda  Allergic Rhinnitis Wants a refill of Flonase she checks on Occasion.  Taking Zyrtec daily with good control of allergies  Relevant past medical, surgical, family and social history reviewed and updated as indicated. Interim medical history since our last visit reviewed. Allergies and medications reviewed and updated.  Review of Systems  Constitutional: Negative.   HENT: Negative.    Eyes: Negative.   Respiratory: Negative.    Cardiovascular: Negative.   Gastrointestinal: Negative.   Endocrine: Negative.   Genitourinary: Negative.   Musculoskeletal: Negative.   Skin: Negative.   Allergic/Immunologic: Negative.   Neurological: Negative.   Hematological: Negative.   Psychiatric/Behavioral: Negative.     Per HPI unless specifically indicated above     Objective:    BP 136/82   Pulse 63   Temp 98 F (36.7 C) (Oral)   Resp 16   Ht 5\' 2"  (1.575 m)   Wt 181 lb 3.2 oz (82.2 kg)   SpO2 99%   BMI 33.14 kg/m   Wt Readings from Last 3 Encounters:  08/12/21 181 lb 3.2 oz (82.2 kg)  03/31/21 182 lb 6.4 oz (82.7 kg)  02/09/21 182 lb 8 oz (82.8 kg)    Physical Exam Constitutional:      General: She is not in acute distress.     Appearance: Normal appearance. She is well-developed.  HENT:     Head: Normocephalic and atraumatic.  Eyes:     General: Lids are normal. No scleral icterus.       Right eye: No discharge.        Left eye: No discharge.     Conjunctiva/sclera: Conjunctivae normal.  Neck:     Vascular: No carotid bruit or JVD.  Cardiovascular:     Rate and Rhythm: Normal rate and regular rhythm.     Heart sounds: Normal heart sounds.  Pulmonary:     Effort: Pulmonary effort is normal. No respiratory distress.     Breath sounds: Normal breath sounds.  Abdominal:     Palpations: There is no hepatomegaly or splenomegaly.  Musculoskeletal:        General: Normal range of motion.     Cervical back: Normal range of motion and neck supple.  Skin:    General: Skin is warm and dry.     Coloration: Skin is not pale.     Findings: No rash.  Neurological:     Mental Status: She is alert and oriented to person, place, and time.  Psychiatric:        Behavior: Behavior normal.        Thought Content: Thought content normal.  Judgment: Judgment normal.    Results for orders placed or performed in visit on 02/09/21  CBC with Differential/Platelet  Result Value Ref Range   WBC 6.2 3.8 - 10.8 Thousand/uL   RBC 4.45 3.80 - 5.10 Million/uL   Hemoglobin 12.7 11.7 - 15.5 g/dL   HCT 38.9 35.0 - 45.0 %   MCV 87.4 80.0 - 100.0 fL   MCH 28.5 27.0 - 33.0 pg   MCHC 32.6 32.0 - 36.0 g/dL   RDW 13.3 11.0 - 15.0 %   Platelets 227 140 - 400 Thousand/uL   MPV 10.7 7.5 - 12.5 fL   Neutro Abs 3,094 1,500 - 7,800 cells/uL   Lymphs Abs 2,468 850 - 3,900 cells/uL   Absolute Monocytes 378 200 - 950 cells/uL   Eosinophils Absolute 211 15 - 500 cells/uL   Basophils Absolute 50 0 - 200 cells/uL   Neutrophils Relative % 49.9 %   Total Lymphocyte 39.8 %   Monocytes Relative 6.1 %   Eosinophils Relative 3.4 %   Basophils Relative 0.8 %  COMPLETE METABOLIC PANEL WITH GFR  Result Value Ref Range   Glucose, Bld 84 65 -  99 mg/dL   BUN 17 7 - 25 mg/dL   Creat 0.75 0.50 - 1.05 mg/dL   GFR, Est Non African American 88 > OR = 60 mL/min/1.41m2   GFR, Est African American 103 > OR = 60 mL/min/1.67m2   BUN/Creatinine Ratio NOT APPLICABLE 6 - 22 (calc)   Sodium 141 135 - 146 mmol/L   Potassium 3.5 3.5 - 5.3 mmol/L   Chloride 102 98 - 110 mmol/L   CO2 31 20 - 32 mmol/L   Calcium 9.1 8.6 - 10.4 mg/dL   Total Protein 7.0 6.1 - 8.1 g/dL   Albumin 4.4 3.6 - 5.1 g/dL   Globulin 2.6 1.9 - 3.7 g/dL (calc)   AG Ratio 1.7 1.0 - 2.5 (calc)   Total Bilirubin 0.4 0.2 - 1.2 mg/dL   Alkaline phosphatase (APISO) 81 37 - 153 U/L   AST 21 10 - 35 U/L   ALT 21 6 - 29 U/L  Hemoglobin A1c  Result Value Ref Range   Hgb A1c MFr Bld 6.1 (H) <5.7 % of total Hgb   Mean Plasma Glucose 128 mg/dL   eAG (mmol/L) 7.1 mmol/L  VITAMIN D 25 Hydroxy (Vit-D Deficiency, Fractures)  Result Value Ref Range   Vit D, 25-Hydroxy 26 (L) 30 - 100 ng/mL      Assessment & Plan:   Problem List Items Addressed This Visit       Unprioritized   Allergic rhinitis    Stable, continue present medications.        Hypercholesterolemia with hypertriglyceridemia    Check labs today.  Taking simvastatin nightly      Relevant Medications   simvastatin (ZOCOR) 20 MG tablet   olmesartan-hydrochlorothiazide (BENICAR HCT) 40-25 MG tablet   Other Relevant Orders   Lipid panel   Hypertension goal BP (blood pressure) < 140/90    Stable, continue present medications.        Relevant Medications   simvastatin (ZOCOR) 20 MG tablet   olmesartan-hydrochlorothiazide (BENICAR HCT) 40-25 MG tablet   Other Relevant Orders   COMPLETE METABOLIC PANEL WITH GFR   Other Visit Diagnoses     Nasal congestion            Follow up plan: Return in about 6 months (around 02/09/2022).

## 2021-08-12 NOTE — Assessment & Plan Note (Signed)
Check labs today.  Taking simvastatin nightly

## 2021-08-12 NOTE — Assessment & Plan Note (Signed)
Stable, continue present medications.   

## 2021-08-13 LAB — COMPLETE METABOLIC PANEL WITH GFR
AG Ratio: 1.5 (calc) (ref 1.0–2.5)
ALT: 20 U/L (ref 6–29)
AST: 20 U/L (ref 10–35)
Albumin: 4.1 g/dL (ref 3.6–5.1)
Alkaline phosphatase (APISO): 81 U/L (ref 37–153)
BUN/Creatinine Ratio: 18 (calc) (ref 6–22)
BUN: 23 mg/dL (ref 7–25)
CO2: 28 mmol/L (ref 20–32)
Calcium: 9.1 mg/dL (ref 8.6–10.4)
Chloride: 103 mmol/L (ref 98–110)
Creat: 1.3 mg/dL — ABNORMAL HIGH (ref 0.50–1.03)
Globulin: 2.8 g/dL (calc) (ref 1.9–3.7)
Glucose, Bld: 89 mg/dL (ref 65–99)
Potassium: 4 mmol/L (ref 3.5–5.3)
Sodium: 139 mmol/L (ref 135–146)
Total Bilirubin: 0.4 mg/dL (ref 0.2–1.2)
Total Protein: 6.9 g/dL (ref 6.1–8.1)
eGFR: 48 mL/min/{1.73_m2} — ABNORMAL LOW (ref >=60)

## 2021-08-13 LAB — LIPID PANEL
Cholesterol: 156 mg/dL (ref <200)
HDL: 52 mg/dL (ref >=50)
LDL Cholesterol (Calc): 79 mg/dL (calc)
Non-HDL Cholesterol (Calc): 104 mg/dL (calc) (ref <130)
Total CHOL/HDL Ratio: 3 (calc) (ref <5.0)
Triglycerides: 146 mg/dL (ref <150)

## 2021-08-18 ENCOUNTER — Other Ambulatory Visit: Payer: Self-pay

## 2021-08-18 DIAGNOSIS — N289 Disorder of kidney and ureter, unspecified: Secondary | ICD-10-CM

## 2021-08-18 NOTE — Addendum Note (Signed)
Addended by: Kathrine Haddock on: 08/18/2021 08:39 AM   Modules accepted: Orders

## 2021-08-25 ENCOUNTER — Other Ambulatory Visit: Payer: Self-pay

## 2021-08-25 ENCOUNTER — Ambulatory Visit
Admission: RE | Admit: 2021-08-25 | Discharge: 2021-08-25 | Disposition: A | Discharge status: Home / Self Care | Payer: BC Managed Care – PPO | Source: Ambulatory Visit | Attending: Family Medicine | Admitting: Family Medicine

## 2021-08-25 DIAGNOSIS — Z1231 Encounter for screening mammogram for malignant neoplasm of breast: Secondary | ICD-10-CM | POA: Insufficient documentation

## 2021-08-26 ENCOUNTER — Ambulatory Visit
Admission: RE | Admit: 2021-08-26 | Discharge: 2021-08-26 | Disposition: A | Discharge status: Home / Self Care | Payer: BC Managed Care – PPO | Source: Ambulatory Visit | Attending: Nurse Practitioner | Admitting: Nurse Practitioner

## 2021-08-26 ENCOUNTER — Ambulatory Visit (INDEPENDENT_AMBULATORY_CARE_PROVIDER_SITE_OTHER): Payer: BC Managed Care – PPO | Admitting: Nurse Practitioner

## 2021-08-26 ENCOUNTER — Encounter: Payer: Self-pay | Admitting: Nurse Practitioner

## 2021-08-26 ENCOUNTER — Ambulatory Visit
Admission: RE | Admit: 2021-08-26 | Discharge: 2021-08-26 | Disposition: A | Discharge status: Home / Self Care | Payer: BC Managed Care – PPO | Attending: Nurse Practitioner | Admitting: Nurse Practitioner

## 2021-08-26 ENCOUNTER — Ambulatory Visit: Payer: Self-pay

## 2021-08-26 VITALS — BP 128/78 | HR 78 | Temp 97.6°F | Resp 18 | Ht 62.0 in | Wt 181.6 lb

## 2021-08-26 DIAGNOSIS — M5442 Lumbago with sciatica, left side: Secondary | ICD-10-CM | POA: Diagnosis not present

## 2021-08-26 DIAGNOSIS — M545 Low back pain, unspecified: Secondary | ICD-10-CM | POA: Diagnosis not present

## 2021-08-26 DIAGNOSIS — M5441 Lumbago with sciatica, right side: Secondary | ICD-10-CM | POA: Diagnosis not present

## 2021-08-26 DIAGNOSIS — M8580 Other specified disorders of bone density and structure, unspecified site: Secondary | ICD-10-CM | POA: Diagnosis not present

## 2021-08-26 DIAGNOSIS — M8588 Other specified disorders of bone density and structure, other site: Secondary | ICD-10-CM | POA: Diagnosis not present

## 2021-08-26 DIAGNOSIS — M47816 Spondylosis without myelopathy or radiculopathy, lumbar region: Secondary | ICD-10-CM | POA: Diagnosis not present

## 2021-08-26 MED ORDER — PREDNISONE 10 MG PO TABS: ORAL_TABLET | ORAL | 0 refills | Status: DC

## 2021-08-26 MED ORDER — METHOCARBAMOL 500 MG PO TABS: 500.0000 mg | ORAL_TABLET | Freq: Four times a day (QID) | ORAL | 0 refills | Status: DC | PRN

## 2021-08-26 NOTE — Progress Notes (Signed)
BP 128/78   Pulse 78   Temp 97.6 F (36.4 C) (Oral)   Resp 18   Ht _0  (1.575 m)   Wt 181 lb 9.6 oz (82.4 kg)   SpO2 99%   BMI 33.22 kg/m    Subjective:    Patient ID: Regina Dunlap, female    DOB: 1960/11/29, 60 y.o.   MRN: [id number]  HPI: Regina Dunlap is a 60 y.o. female, here alone  Chief Complaint  Patient presents with   Back Pain    Lower back pain this morning it was so bad pt could not get up. Radiates to both legs and feels pain. Pt denies any injury.   Back pain:  She says she woke up this morning got out of bed and headed to the bathroom.  She says she noticed her back started really hurting and locked up.  She denies any trauma, fever, incontinence, or urinary complaints.  She says she says the pain goes across her lower back and is sharp and radiates down both legs.  Discussed treatment options.  Her recent blood work showed that her eGFR was low at 48. Discussed not using NSAIDs, take tylenol for pain.  Will prescribe course of steroids and muscle relaxer.  Discussed for the next two days to do ice therapy and then switch to heat.  She may also use OTC lidocaine patches for pain.   Relevant past medical, surgical, family and social history reviewed and updated as indicated. Interim medical history since our last visit reviewed. Allergies and medications reviewed and updated.  Review of Systems  Constitutional: Negative for fever or weight change.  Respiratory: Negative for cough and shortness of breath.   Cardiovascular: Negative for chest pain or palpitations.  Gastrointestinal: Negative for abdominal pain, no bowel changes.  Musculoskeletal: Negative for gait problem or joint swelling. Positive for lower back pain Skin: Negative for rash.  Neurological: Negative for dizziness or headache.  No other specific complaints in a complete review of systems (except as listed in HPI above).      Objective:    BP 128/78   Pulse 78   Temp 97.6 F  (36.4 C) (Oral)   Resp 18   Ht _1  (1.575 m)   Wt 181 lb 9.6 oz (82.4 kg)   SpO2 99%   BMI 33.22 kg/m   Wt Readings from Last 3 Encounters:  08/26/21 181 lb 9.6 oz (82.4 kg)  08/12/21 181 lb 3.2 oz (82.2 kg)  03/31/21 182 lb 6.4 oz (82.7 kg)    Physical Exam  Constitutional: Patient appears well-developed and well-nourished. Obese No distress.  HEENT: head atraumatic, normocephalic, pupils equal and reactive to light, neck supple Cardiovascular: Normal rate, regular rhythm and normal heart sounds.  No murmur heard. No BLE edema. Pulmonary/Chest: Effort normal and breath sounds normal. No respiratory distress. Abdominal: Soft.  There is no tenderness. Musculoskeletal: noted tenderness and tightness across low back, decrease ROM Psychiatric: Patient has a normal mood and affect. behavior is normal. Judgment and thought content normal.   Results for orders placed or performed in visit on 08/12/21  Lipid panel  Result Value Ref Range   Cholesterol 156 <200 mg/dL   HDL 52 > OR = 50 mg/dL   Triglycerides 146 <150 mg/dL   LDL Cholesterol (Calc) 79 mg/dL (calc)   Total CHOL/HDL Ratio 3.0 <5.0 (calc)   Non-HDL Cholesterol (Calc) 104 <130 mg/dL (calc)  COMPLETE METABOLIC PANEL WITH GFR  Result Value  Ref Range   Glucose, Bld 89 65 - 99 mg/dL   BUN 23 7 - 25 mg/dL   Creat 1.30 (H) 0.50 - 1.03 mg/dL   eGFR 48 (L) > OR = 60 mL/min/1.61m   BUN/Creatinine Ratio 18 6 - 22 (calc)   Sodium 139 135 - 146 mmol/L   Potassium 4.0 3.5 - 5.3 mmol/L   Chloride 103 98 - 110 mmol/L   CO2 28 20 - 32 mmol/L   Calcium 9.1 8.6 - 10.4 mg/dL   Total Protein 6.9 6.1 - 8.1 g/dL   Albumin 4.1 3.6 - 5.1 g/dL   Globulin 2.8 1.9 - 3.7 g/dL (calc)   AG Ratio 1.5 1.0 - 2.5 (calc)   Total Bilirubin 0.4 0.2 - 1.2 mg/dL   Alkaline phosphatase (APISO) 81 37 - 153 U/L   AST 20 10 - 35 U/L   ALT 20 6 - 29 U/L      Assessment & Plan:   1. Acute bilateral low back pain with bilateral sciatica -ice therapy  for next two days then heat -may use OTC lidocaine patches -do not take NSAIDs ( like ibuprofen, aleve, motrin) -take tylenol - predniSONE (DELTASONE) 10 MG tablet; Day 1 take 6 pills, day 2 take 5 pills, day 3 take 4 pills, day 4 take 3 pills, day 5 take 2 pills, day 6 take 1 pills  Dispense: 21 tablet; Refill: 0 - methocarbamol (ROBAXIN) 500 MG tablet; Take 1 tablet (500 mg total) by mouth every 6 (six) hours as needed for muscle spasms.  Dispense: 20 tablet; Refill: 0   Follow up plan: Return if symptoms worsen or fail to improve.

## 2021-08-26 NOTE — Telephone Encounter (Signed)
Pt. Reports she woke up this morning with severe low back that radiates into both legs. No known injury. Warm transfer to [facility] in the practice for an appointment.    Reason for Disposition  [1] SEVERE back pain (e.g., excruciating, unable to do any normal activities) AND [2] not improved 2 hours after pain medicine  Answer Assessment - Initial Assessment Questions 1. ONSET: "When did the pain begin?"      Today 2. LOCATION: "Where does it hurt?" (upper, mid or lower back)     Low 3. SEVERITY: "How bad is the pain?"  (e.g., Scale 1-10; mild, moderate, or severe)   - MILD (1-3): doesn't interfere with normal activities    - MODERATE (4-7): interferes with normal activities or awakens from sleep    - SEVERE (8-10): excruciating pain, unable to do any normal activities      10 4. PATTERN: "Is the pain constant?" (e.g., yes, no; constant, intermittent)      Constant 5. RADIATION: "Does the pain shoot into your legs or elsewhere?"     Both legs 6. CAUSE:  "What do you think is causing the back pain?"      Unsure 7. BACK OVERUSE:  "Any recent lifting of heavy objects, strenuous work or exercise?"     No 8. MEDICATIONS: "What have you taken so far for the pain?" (e.g., nothing, acetaminophen, NSAIDS)     No 9. NEUROLOGIC SYMPTOMS: "Do you have any weakness, numbness, or problems with bowel/bladder control?"     No 10. OTHER SYMPTOMS: "Do you have any other symptoms?" (e.g., fever, abdominal pain, burning with urination, blood in urine)       No 11. PREGNANCY: "Is there any chance you are pregnant?" (e.g., yes, no; LMP)       No  Protocols used: Back Pain-A-AH

## 2021-09-09 ENCOUNTER — Ambulatory Visit: Payer: Self-pay | Admitting: *Deleted

## 2021-09-09 ENCOUNTER — Encounter: Payer: Self-pay | Admitting: Family Medicine

## 2021-09-09 DIAGNOSIS — Z20822 Contact with and (suspected) exposure to covid-19: Secondary | ICD-10-CM | POA: Diagnosis not present

## 2021-09-09 DIAGNOSIS — R070 Pain in throat: Secondary | ICD-10-CM | POA: Diagnosis not present

## 2021-09-09 DIAGNOSIS — J069 Acute upper respiratory infection, unspecified: Secondary | ICD-10-CM | POA: Diagnosis not present

## 2021-09-09 NOTE — Telephone Encounter (Signed)
Spoke to pt and she is going to [facility]

## 2021-09-09 NOTE — Telephone Encounter (Signed)
Reason for Disposition  [1] Sore throat AND [2] strep throat EXPOSURE (i.e., meets definition) within past 10 days  Answer Assessment - Initial Assessment Questions 1. STREP EXPOSURE: "Was the exposure to someone who lives within your home?" If not, ask: "How much contact did you have with the sick individual?"      Exposure at work 2. ONSET: "How many days ago did the contact occur?"      Since Tuesday 2 days ago  3. PROVEN STREP: "Are you sure the person with strep had a positive throat culture or rapid strep test?"      yes 4. STREP SYMPTOMS: "Do YOU have a sore throat, fever, or other symptoms suggestive of strep?"      Sore throat , white patches on back of throat, chills  5. VIRAL SYMPTOMS: "Are there any symptoms of a cold, such as a runny nose, cough, hoarse voice?"     No , pain to swallow , "hard to swallow"  6. PREGNANCY: "Is there any chance you are pregnant?" "When was your last menstrual period?"     na  Protocols used: Strep Throat Exposure-A-AH

## 2021-09-09 NOTE — Telephone Encounter (Signed)
°  Chief Complaint: "hard to swallow", exposed to strep throat requesting to get checked  Symptoms: pain swallowing, white patches on back of throat , mucus bloody looking, chills, headache  Frequency: started yesterday exposed last on Tuesday  Pertinent Negatives: Patient denies cough, hoarseness, runny nose  Disposition: [] ED /[x] Urgent Care (no appt availability in office) / [] Appointment(In office/virtual)/ []  [facility]/ [] Home Care/ [] Refused Recommended Disposition  Additional Notes:  Requesting a call back if able to be seen by any provider or tested for  strep throat. Recommended UC due to need for testing . Please advise . No available OV until Jan.

## 2021-09-09 NOTE — Telephone Encounter (Signed)
Pt will be going to UC.

## 2021-09-14 ENCOUNTER — Encounter: Payer: Self-pay | Admitting: Nurse Practitioner

## 2021-09-15 ENCOUNTER — Telehealth (INDEPENDENT_AMBULATORY_CARE_PROVIDER_SITE_OTHER): Payer: BC Managed Care – PPO | Admitting: Nurse Practitioner

## 2021-09-15 ENCOUNTER — Encounter: Payer: Self-pay | Admitting: Nurse Practitioner

## 2021-09-15 ENCOUNTER — Other Ambulatory Visit: Payer: Self-pay | Admitting: Nurse Practitioner

## 2021-09-15 ENCOUNTER — Other Ambulatory Visit: Payer: Self-pay

## 2021-09-15 DIAGNOSIS — J069 Acute upper respiratory infection, unspecified: Secondary | ICD-10-CM

## 2021-09-15 DIAGNOSIS — R051 Acute cough: Secondary | ICD-10-CM | POA: Diagnosis not present

## 2021-09-15 DIAGNOSIS — M5442 Lumbago with sciatica, left side: Secondary | ICD-10-CM | POA: Diagnosis not present

## 2021-09-15 DIAGNOSIS — J029 Acute pharyngitis, unspecified: Secondary | ICD-10-CM | POA: Diagnosis not present

## 2021-09-15 DIAGNOSIS — M5441 Lumbago with sciatica, right side: Secondary | ICD-10-CM

## 2021-09-15 MED ORDER — AMOXICILLIN 500 MG PO TABS: 500.0000 mg | ORAL_TABLET | Freq: Two times a day (BID) | ORAL | 0 refills | Status: DC

## 2021-09-15 MED ORDER — BENZONATATE 100 MG PO CAPS: 200.0000 mg | ORAL_CAPSULE | Freq: Three times a day (TID) | ORAL | 0 refills | Status: DC | PRN

## 2021-09-15 NOTE — Progress Notes (Signed)
Called and spoke to patient that insurance would not approve MRI until she has had six weeks of no improvement.  Will continue to treat pain with tylenol due to low eGFR.  Heat therapy and stretching.  She verbalized understanding and she will follow-up if pain does not improve.

## 2021-09-15 NOTE — Progress Notes (Signed)
Name: Regina Dunlap   MRN: [id number]    DOB: 10-06-1960   Date:09/15/2021       Progress Note  Subjective  Chief Complaint  Chief Complaint  Patient presents with   Back Pain   Follow-up    Productive cough, seen at [facility]    I connected with  [name]  on 09/15/21 at 11:30 am by telephone enabled telemedicine application and verified that I am speaking with the correct person using two identifiers.  I discussed the limitations of evaluation and management by telemedicine and the availability of in person appointments. The patient expressed understanding and agreed to proceed with a virtual visit  Staff also discussed with the patient that there may be a patient responsible charge related to this service. Patient Location: home Provider Location: [facility] Additional Individuals present: alone  HPI  URI/cough/sore throat: She says her symptoms started two weeks ago.  Symptoms include sore throat, cough, nasal congestion and fever. She says she went to urgent care and was tested for covid, flu and strep.  She said they were all negative. She denies any shortness of breath. She says her sore throat is still present and she says she has white spots on the back of her throat and it hurts to swallow.  She also says her lymph nodes feel swollen on her neck. She says she has used lime juice, ginger, warm liquids, tylenol, flonase and zyrtec.  Discussed other OTC treatments she can use.  Push fluids. She is unable to come to the office for a repeat test. However she has had a fever, sore throat and white patches on back of throat.  Will send in antibiotics for presentation of strep throat.   Low back pain: She has continued to have low back pain after steroids and muscle relaxer but no improvement.  She was seen on 08/26/21 for the low back pain. She descries the pain as sharp and radiates down both legs.  She has also noticed some numbness in bilateral legs.  She denies any  incontinence, or fever.  Got an xray on 08/26/21 which showed Osteopenia and degenerative change without evidence of fractures.   Patient Active Problem List   Diagnosis Date Noted   Vitamin D deficiency 05/01/2020   Hot flashes 07/15/2019   Varicose veins of leg with pain, left 04/23/2019   Hypokalemia 11/23/2017   Postmenopausal bleeding 10/17/2017   Varicose veins of both lower extremities without ulcer or inflammation 04/27/2017   Sleep apnea 04/24/2017   Encounter for screening colonoscopy 04/24/2017   Obesity (BMI 30.0-34.9) 11/04/2016   Fatty liver 11/04/2016   History of concussion 06/07/2016   MCI (mild cognitive impairment) 06/07/2016   Prediabetes 03/28/2016   Memory impairment 03/28/2016   Decreased sense of smell 03/28/2016   Hypercholesterolemia with hypertriglyceridemia 10/13/2015   Encounter for screening mammogram for malignant neoplasm of breast 10/13/2015   GERD without esophagitis 10/13/2015   Screening for STD (sexually transmitted disease) 10/13/2015   Allergic rhinitis 09/16/2015   Lipoma of arm 09/16/2015   Family history of colon cancer 09/16/2015   Hypertension goal BP (blood pressure) < 140/90     Social History   Tobacco Use   Smoking status: Never   Smokeless tobacco: Never  Substance Use Topics   Alcohol use: Yes    Comment: social     Current Outpatient Medications:    calcium gluconate 500 MG tablet, Take 1 tablet by mouth 3 (three) times daily., Disp: ,  Rfl:    cholecalciferol (VITAMIN D3) 25 MCG (1000 UNIT) tablet, Take 5,000 Units by mouth daily., Disp: , Rfl:    fluticasone (FLONASE) 50 MCG/ACT nasal spray, Place 2 sprays into both nostrils daily., Disp: 16 g, Rfl: 6   Krill Oil 1000 MG CAPS, Take 1 capsule daily by mouth. , Disp: , Rfl:    loratadine (CLARITIN) 10 MG tablet, TAKE 1 TABLET(10 MG) BY MOUTH DAILY AS NEEDED FOR ALLERGIES, Disp: 30 tablet, Rfl: 1   Magnesium 100 MG CAPS, Take 1,000 mg by mouth., Disp: , Rfl:     olmesartan-hydrochlorothiazide (BENICAR HCT) 40-25 MG tablet, Take 1 tablet by mouth daily., Disp: 90 tablet, Rfl: 3   omeprazole (PRILOSEC) 20 MG capsule, Take 20 mg by mouth daily as needed (GERD, reflux)., Disp: , Rfl:    potassium chloride (KLOR-CON) 10 MEQ tablet, Take 10 mEq by mouth daily., Disp: , Rfl:    simvastatin (ZOCOR) 20 MG tablet, Take 1 tablet (20 mg total) by mouth daily at 6 PM., Disp: 90 tablet, Rfl: 3   TURMERIC PO, Take by mouth daily. Reported on 03/28/2016, Disp: , Rfl:    vitamin B-12 (CYANOCOBALAMIN) 100 MCG tablet, Take 100 mcg by mouth daily., Disp: , Rfl:    methocarbamol (ROBAXIN) 500 MG tablet, Take 1 tablet (500 mg total) by mouth every 6 (six) hours as needed for muscle spasms. (Patient not taking: Reported on 09/15/2021), Disp: 20 tablet, Rfl: 0   predniSONE (DELTASONE) 10 MG tablet, Day 1 take 6 pills, day 2 take 5 pills, day 3 take 4 pills, day 4 take 3 pills, day 5 take 2 pills, day 6 take 1 pills (Patient not taking: Reported on 09/15/2021), Disp: 21 tablet, Rfl: 0  Allergies  Allergen Reactions   Ciprofloxacin Diarrhea and Nausea And Vomiting   Lisinopril Cough   Migraine Formula [Aspirin-Acetaminophen-Caffeine] Rash    Midrina; broke out in rash    Povidone Iodine Rash    I personally reviewed active problem list, medication list, allergies, notes from last encounter with the patient/caregiver today.  ROS  Constitutional: positive for fever, negative for  weight change.  HEENT: positive for nasal congestion, sore throat Respiratory: Positive for cough, negative for shortness of breath.   Cardiovascular: Negative for chest pain or palpitations.  Gastrointestinal: Negative for abdominal pain, no bowel changes.  Musculoskeletal: Positive for gait problem and lower back pain, negative joint swelling.  Skin: Negative for rash.  Neurological: Negative for dizziness or headache.  No other specific complaints in a complete review of systems (except as  listed in HPI above).   Objective  Virtual encounter, vitals not obtained.  There is no height or weight on file to calculate BMI.  Nursing Note and Vital Signs reviewed.  Physical Exam  Awake, alert and oriented, speaking in complete sentences  No results found for this or any previous visit (from the past 72 hour(s)).  Assessment & Plan  1. Viral upper respiratory tract infection -discussed OTC treatments for symptoms - benzonatate (TESSALON) 100 MG capsule; Take 2 capsules (200 mg total) by mouth 3 (three) times daily as needed for cough.  Dispense: 20 capsule; Refill: 0  2. Acute cough  - benzonatate (TESSALON) 100 MG capsule; Take 2 capsules (200 mg total) by mouth 3 (three) times daily as needed for cough.  Dispense: 20 capsule; Refill: 0  3. Sore throat  - amoxicillin (AMOXIL) 500 MG tablet; Take 1 tablet (500 mg total) by mouth 2 (two) times daily for  10 days.  Dispense: 20 tablet; Refill: 0  4. Acute bilateral low back pain with bilateral sciatica  - MR LUMBAR SPINE W WO CONTRAST; Future   -Red flags and when to present for emergency care or RTC including fever >101.82F, chest pain, shortness of breath, new/worsening/un-resolving symptoms, reviewed with patient at time of visit. Follow up and care instructions discussed and provided in AVS. - I discussed the assessment and treatment plan with the patient. The patient was provided an opportunity to ask questions and all were answered. The patient agreed with the plan and demonstrated an understanding of the instructions.  I provided 30 minutes of non-face-to-face time during this encounter.  Bo Merino, FNP

## 2021-09-16 ENCOUNTER — Other Ambulatory Visit: Payer: Self-pay | Admitting: Nurse Practitioner

## 2021-09-16 ENCOUNTER — Ambulatory Visit: Payer: Self-pay | Admitting: *Deleted

## 2021-09-16 DIAGNOSIS — J029 Acute pharyngitis, unspecified: Secondary | ICD-10-CM

## 2021-09-16 MED ORDER — AZITHROMYCIN 250 MG PO TABS: ORAL_TABLET | ORAL | 0 refills | Status: AC

## 2021-09-16 NOTE — Telephone Encounter (Signed)
Please advise 

## 2021-09-16 NOTE — Telephone Encounter (Signed)
°  Chief Complaint: Itching, diarrhea Symptoms: Itching, diarrhea Frequency: Onset this AM Pertinent Negatives: Patient denies SOB, swelling, rash Disposition: [] ED /[] Urgent Care (no appt availability in office) / [] Appointment(In office/virtual)/ []  [facility]/ [x] Home Care/ [] Refused Recommended Disposition  Additional Notes: Pt started amoxicillin last night. 4 episodes of diarrhea this morning, itching, no rash. LAst took at 0800 this AM. Advised to stop med until hears from PCP, care advise given, verbalizes understanding. Please advise.   Reason for Disposition  Taking new prescription antibiotic (Exception: finished taking new prescription antibiotic)  Answer Assessment - Initial Assessment Questions 1. APPEARANCE of RASH: "Describe the rash." (e.g., spots, blisters, raised areas, skin peeling, scaly)     None 2. SIZE: "How big are the spots?" (e.g., tip of pen, eraser, coin; inches, centimeters)     *No Answer* 3. LOCATION: "Where is the rash located?"     *No Answer* 4. COLOR: "What color is the rash?" (Note: It is difficult to assess rash color in people with darker-colored skin. When this situation occurs, simply ask the caller to describe what they see.)     *No Answer* 5. ONSET: "When did the rash begin?"     *No Answer* 6. FEVER: "Do you have a fever?" If Yes, ask: "What is your temperature, how was it measured, and when did it start?"     *No Answer* 7. ITCHING: "Does the rash itch?" If Yes, ask: "How bad is the itch?" (Scale 1-10; or mild, moderate, severe)     Itching all over 8. CAUSE: "What do you think is causing the rash?"     Amoxicillin. 9. NEW MEDICATION: "What new medication are you taking?" (e.g., name of antibiotic) "When did you start taking this medication?".     *No Answer* 10. OTHER SYMPTOMS: "Do you have any other symptoms?" (e.g., sore throat, fever, joint pain)       Diarrhea, this AM. From 7a-9a  4 episodes.  Protocols used: Rash -  Widespread On Drugs-A-AH

## 2021-09-27 ENCOUNTER — Encounter: Payer: Self-pay | Admitting: Nurse Practitioner

## 2021-10-25 ENCOUNTER — Ambulatory Visit: Payer: Self-pay

## 2021-10-25 ENCOUNTER — Ambulatory Visit (INDEPENDENT_AMBULATORY_CARE_PROVIDER_SITE_OTHER): Payer: BC Managed Care – PPO | Admitting: Internal Medicine

## 2021-10-25 VITALS — BP 112/75 | HR 94 | Resp 18 | Ht 62.0 in | Wt 179.0 lb

## 2021-10-25 DIAGNOSIS — G4733 Obstructive sleep apnea (adult) (pediatric): Secondary | ICD-10-CM

## 2021-10-25 DIAGNOSIS — Z9989 Dependence on other enabling machines and devices: Secondary | ICD-10-CM

## 2021-10-25 DIAGNOSIS — Z7189 Other specified counseling: Secondary | ICD-10-CM | POA: Diagnosis not present

## 2021-10-25 DIAGNOSIS — I10 Essential (primary) hypertension: Secondary | ICD-10-CM

## 2021-10-25 NOTE — Telephone Encounter (Signed)
°  Chief Complaint: hand numbness Symptoms: numbness in both hands, prominent more at night than day Frequency: been ongoing but 2 weeks have gotten worse Pertinent Negatives: Patient denies pain or swelling Disposition: [] ED /[] Urgent Care (no appt availability in office) / [x] Appointment(In office/virtual)/ []  [facility]/ [] Home Care/ [] Refused Recommended Disposition /[] [facility]/ []  Follow-up with PCP Additional Notes:    Reason for Disposition  Numbness (i.e., loss of sensation) in hand or fingers (Exception: just tingling; numbness present > 2 weeks)  Answer Assessment - Initial Assessment Questions 1. ONSET: "When did the pain start?"     Gotten worse over last 2 weeks 2. LOCATION: "Where is the pain located?"     Both hands 3. PAIN: "How bad is the pain?" (Scale 1-10; or mild, moderate, severe)   - MILD (1-3): doesn't interfere with normal activities   - MODERATE (4-7): interferes with normal activities (e.g., work or school) or awakens from sleep   - SEVERE (8-10): excruciating pain, unable to use hand at all     0 6. AGGRAVATING FACTORS: "What makes the pain worse?" (e.g., using computer)     Nothing makes it worse or better 7. OTHER SYMPTOMS: "Do you have any other symptoms?" (e.g., neck pain, swelling, rash, numbness, fever)     Numbness that wakes me up in the middle of the night  Protocols used: Hand and Wrist Pain-A-AH

## 2021-10-25 NOTE — Progress Notes (Signed)
[facility] [place], [place] [address]  Pulmonary Sleep Medicine   Office Visit Note  Patient Name: Regina Dunlap DOB: 1960/10/18 MRN [id number]    Chief Complaint: Obstructive Sleep Apnea visit  Brief History:  [name] is seen today for follow up visit.  The patient has a 8 year history of sleep apnea. Patient is not using PAP nightly.  The patient feels better after sleeping with PAP.  The patient reports not being able to use it during an episode of Covid, but is back to regular usage. from PAP use. Reported sleepiness is  resolved and the Epworth Sleepiness Score is 0 out of 24. The patient does not take naps. The patient complains of the following: no problems  The compliance download shows  compliance with an average use time of 5:33 hours @ 76%. The AHI is 0.3  The patient does not complain of limb movements disrupting sleep.  ROS  General: (-) fever, (-) chills, (-) night sweat Nose and Sinuses: (-) nasal stuffiness or itchiness, (-) postnasal drip, (-) nosebleeds, (-) sinus trouble. Mouth and Throat: (-) sore throat, (-) hoarseness. Neck: (-) swollen glands, (-) enlarged thyroid, (-) neck pain. Respiratory: - cough, - shortness of breath, - wheezing. Neurologic: - numbness, - tingling. Psychiatric: - anxiety, - depression   Current Medication: Outpatient Encounter Medications as of 10/25/2021  Medication Sig   calcium gluconate 500 MG tablet Take 1 tablet by mouth 3 (three) times daily.   cholecalciferol (VITAMIN D3) 25 MCG (1000 UNIT) tablet Take 5,000 Units by mouth daily.   fluticasone (FLONASE) 50 MCG/ACT nasal spray Place 2 sprays into both nostrils daily.   Krill Oil 1000 MG CAPS Take 1 capsule daily by mouth.    loratadine (CLARITIN) 10 MG tablet TAKE 1 TABLET(10 MG) BY MOUTH DAILY AS NEEDED FOR ALLERGIES   Magnesium 100 MG CAPS Take 1,000 mg by mouth.   olmesartan-hydrochlorothiazide (BENICAR HCT) 40-25 MG tablet Take 1 tablet  by mouth daily.   omeprazole (PRILOSEC) 20 MG capsule Take 20 mg by mouth daily as needed (GERD, reflux).   simvastatin (ZOCOR) 20 MG tablet Take 1 tablet (20 mg total) by mouth daily at 6 PM.   TURMERIC PO Take by mouth daily. Reported on 03/28/2016   vitamin B-12 (CYANOCOBALAMIN) 100 MCG tablet Take 100 mcg by mouth daily.   [DISCONTINUED] benzonatate (TESSALON) 100 MG capsule Take 2 capsules (200 mg total) by mouth 3 (three) times daily as needed for cough.   [DISCONTINUED] potassium chloride (KLOR-CON) 10 MEQ tablet Take 10 mEq by mouth daily.   No facility-administered encounter medications on file as of 10/25/2021.    Surgical History: Past Surgical History:  Procedure Laterality Date   BREAST BIOPSY Right 1999   neg   BREAST CYST ASPIRATION Right    neg   COLONOSCOPY WITH PROPOFOL N/A 08/07/2020   Procedure: COLONOSCOPY WITH PROPOFOL;  Surgeon: Lucilla Lame, MD;  Location: [facility];  Service: Endoscopy;  Laterality: N/A;  Sleep apnea priority 4   TUBAL LIGATION      Medical History: Past Medical History:  Diagnosis Date   Esophageal reflux    Fatty liver 11/04/2016   Mastodynia    Other and unspecified hyperlipidemia    Overweight (BMI 25.0-29.9) 11/04/2016   Pain in thoracic spine    Prediabetes 03/28/2016   Unspecified essential hypertension    Unspecified sleep apnea    CPAP    Family History: Non contributory to the present illness  Social History: Social History   Socioeconomic History   Marital status: Married    Spouse name: Not on file   Number of children: Not on file   Years of education: Not on file   Highest education level: Not on file  Occupational History   Not on file  Tobacco Use   Smoking status: Never   Smokeless tobacco: Never  Vaping Use   Vaping Use: Never used  Substance and Sexual Activity   Alcohol use: Yes    Comment: social   Drug use: No   Sexual activity: Yes  Other Topics Concern   Not on file  Social History  Narrative   Not on file   Social Determinants of Health   Financial Resource Strain: Not on file  Food Insecurity: Not on file  Transportation Needs: Not on file  Physical Activity: Not on file  Stress: Not on file  Social Connections: Not on file  Intimate Partner Violence: Not on file    Vital Signs: Blood pressure 112/75, pulse 94, resp. rate 18, height '5\' 2"'  (1.575 m), weight 179 lb (81.2 kg), SpO2 96 %. Body mass index is 32.74 kg/m.    Examination: General Appearance: The patient is well-developed, well-nourished, and in no distress. Neck Circumference: 38 cm Skin: Gross inspection of skin unremarkable. Head: normocephalic, no gross deformities. Eyes: no gross deformities noted. ENT: ears appear grossly normal Neurologic: Alert and oriented. No involuntary movements.    EPWORTH SLEEPINESS SCALE:  Scale:  (0)= no chance of dozing; (1)= slight chance of dozing; (2)= moderate chance of dozing; (3)= high chance of dozing  Chance  Situtation    Sitting and reading: 0    Watching TV: 0    Sitting Inactive in public: 0    As a passenger in car: 0      Lying down to rest: 0    Sitting and talking: 0    Sitting quielty after lunch: 0    In a car, stopped in traffic: 0   TOTAL SCORE:   0 out of 24    SLEEP STUDIES:  PSG 04/08/14 AHI 18 SpO23mn 83%   CPAP COMPLIANCE DATA:  Date Range: 10/20/20 - 10/19/2021  Average Daily Use: 5:33 hours  Median Use: 7:12 hours  Compliance for > 4 Hours: 76%  AHI: 0.3 respiratory events per hour  Days Used: 288/365  Mask Leak: 1.8 lpm  95th Percentile Pressure: 10.9 cmH2O    LABS: Recent Results (from the past 2160 hour(s))  Lipid panel     Status: None   Collection Time: 08/12/21 11:16 AM  Result Value Ref Range   Cholesterol 156 <200 mg/dL   HDL 52 > OR = 50 mg/dL   Triglycerides 146 <150 mg/dL   LDL Cholesterol (Calc) 79 mg/dL (calc)    Comment: Reference range: <100 . Desirable range <100  mg/dL for primary prevention;   <70 mg/dL for patients with CHD or diabetic patients  with > or = 2 CHD risk factors. .Marland KitchenLDL-C is now calculated using the Martin-Hopkins  calculation, which is a validated novel method providing  better accuracy than the Friedewald equation in the  estimation of LDL-C.  MCresenciano Genreet al. JAnnamaria Helling 24492;010(07: 2061-2068  (http://education.[employer].com/faq/FAQ164)    Total CHOL/HDL Ratio 3.0 <5.0 (calc)   Non-HDL Cholesterol (Calc) 104 <130 mg/dL (calc)    Comment: For patients with diabetes plus 1 major ASCVD risk  factor, treating to a non-HDL-C goal of <100 mg/dL  (LDL-C of <  70 mg/dL) is considered a therapeutic  option.   COMPLETE METABOLIC PANEL WITH GFR     Status: Abnormal   Collection Time: 08/12/21 11:16 AM  Result Value Ref Range   Glucose, Bld 89 65 - 99 mg/dL    Comment: .            Fasting reference interval .    BUN 23 7 - 25 mg/dL   Creat 1.30 (H) 0.50 - 1.03 mg/dL   eGFR 48 (L) > OR = 60 mL/min/1.65m    Comment: The eGFR is based on the CKD-EPI 2021 equation. To calculate  the new eGFR from a previous Creatinine or Cystatin C result, go to https://www.kidney.org/professionals/ kdoqi/gfr%5Fcalculator    BUN/Creatinine Ratio 18 6 - 22 (calc)   Sodium 139 135 - 146 mmol/L   Potassium 4.0 3.5 - 5.3 mmol/L   Chloride 103 98 - 110 mmol/L   CO2 28 20 - 32 mmol/L   Calcium 9.1 8.6 - 10.4 mg/dL   Total Protein 6.9 6.1 - 8.1 g/dL   Albumin 4.1 3.6 - 5.1 g/dL   Globulin 2.8 1.9 - 3.7 g/dL (calc)   AG Ratio 1.5 1.0 - 2.5 (calc)   Total Bilirubin 0.4 0.2 - 1.2 mg/dL   Alkaline phosphatase (APISO) 81 37 - 153 U/L   AST 20 10 - 35 U/L   ALT 20 6 - 29 U/L    Radiology: DG Lumbar Spine Complete  Result Date: 08/28/2021 CLINICAL DATA:  Back pain. EXAM: LUMBAR SPINE - COMPLETE 4+ VIEW COMPARISON:  Study of 08/25/2020. FINDINGS: Mild osteopenia. There is preservation of the normal vertebral heights without evidence of fractures.  Grade 1 degenerative L4-5 anterolisthesis is unchanged, most likely due to prominent facet osteophytes at this level, with slight dextroscoliosis and otherwise no AP listhesis. There are mild features of spondylosis. Moderate facet hypertrophy is seen at L3-4 and L5-S1. Above L4 there normal disc heights. At L4-5 and L5-S1, partial disc space loss is unchanged. BTL clips in the pelvis are redemonstrated. IMPRESSION: Osteopenia and degenerative change without evidence of fractures. Stable exam including stable alignment findings. Electronically Signed   By: KTelford NabM.D.   On: 08/28/2021 21:08    No results found.  No results found.    Assessment and Plan: Patient Active Problem List   Diagnosis Date Noted   OSA on CPAP 10/25/2021   CPAP use counseling 10/25/2021   Essential hypertension 10/25/2021   Vitamin D deficiency 05/01/2020   Hot flashes 07/15/2019   Varicose veins of leg with pain, left 04/23/2019   Hypokalemia 11/23/2017   Postmenopausal bleeding 10/17/2017   Varicose veins of both lower extremities without ulcer or inflammation 04/27/2017   Sleep apnea 04/24/2017   Encounter for screening colonoscopy 04/24/2017   Obesity (BMI 30.0-34.9) 11/04/2016   Fatty liver 11/04/2016   History of concussion 06/07/2016   MCI (mild cognitive impairment) 06/07/2016   Prediabetes 03/28/2016   Memory impairment 03/28/2016   Decreased sense of smell 03/28/2016   Hypercholesterolemia with hypertriglyceridemia 10/13/2015   Encounter for screening mammogram for malignant neoplasm of breast 10/13/2015   GERD without esophagitis 10/13/2015   Screening for STD (sexually transmitted disease) 10/13/2015   Allergic rhinitis 09/16/2015   Lipoma of arm 09/16/2015   Family history of colon cancer 09/16/2015   Hypertension goal BP (blood pressure) < 140/90     1. OSA on CPAP The patient does tolerate PAP and reports definite benefit from PAP use. The patient was reminded  how to clean  equipment and advised to replace supplies routinely. The patient was also counselled on weight loss. The compliance is fair . The AHI is 0.3.   OSA- continue with good compliance with pap. F/u one year.    2. CPAP use counseling CPAP Counseling: had a lengthy discussion with the patient regarding the importance of PAP therapy in management of the sleep apnea. Patient appears to understand the risk factor reduction and also understands the risks associated with untreated sleep apnea. Patient will try to make a good faith effort to remain compliant with therapy. Also instructed the patient on proper cleaning of the device including the water must be changed daily if possible and use of distilled water is preferred. Patient understands that the machine should be regularly cleaned with appropriate recommended cleaning solutions that do not damage the PAP machine for example given white vinegar and water rinses. Other methods such as ozone treatment may not be as good as these simple methods to achieve cleaning.   3. Essential hypertension Hypertension Counseling:   The following hypertensive lifestyle modification were recommended and discussed:  1. Limiting alcohol intake to less than 1 oz/day of ethanol:(24 oz of beer or 8 oz of wine or 2 oz of 100-proof whiskey). 2. Take baby ASA 81 mg daily. 3. Importance of regular aerobic exercise and losing weight. 4. Reduce dietary saturated fat and cholesterol intake for overall cardiovascular health. 5. Maintaining adequate dietary potassium, calcium, and magnesium intake. 6. Regular monitoring of the blood pressure. 7. Reduce sodium intake to less than 100 mmol/day (less than 2.3 gm of sodium or less than 6 gm of sodium choride)     General Counseling: I have discussed the findings of the evaluation and examination with Nola.  I have also discussed any further diagnostic evaluation thatmay be needed or ordered today. Addilyne verbalizes understanding of  the findings of todays visit. We also reviewed her medications today and discussed drug interactions and side effects including but not limited excessive drowsiness and altered mental states. We also discussed that there is always a risk not just to her but also people around her. she has been encouraged to call the office with any questions or concerns that should arise related to todays visit.  No orders of the defined types were placed in this encounter.       I have personally obtained a history, examined the patient, evaluated laboratory and imaging results, formulated the assessment and plan and placed orders. This patient was seen today by Tressie Ellis, PA-C in collaboration with Dr. Devona Konig.   Allyne Gee, MD [facility] Diplomate ABMS Pulmonary Critical Care Medicine and Sleep Medicine

## 2021-10-25 NOTE — Patient Instructions (Signed)

## 2021-10-26 ENCOUNTER — Encounter: Payer: Self-pay | Admitting: Internal Medicine

## 2021-10-26 ENCOUNTER — Ambulatory Visit (INDEPENDENT_AMBULATORY_CARE_PROVIDER_SITE_OTHER): Payer: BC Managed Care – PPO | Admitting: Internal Medicine

## 2021-10-26 VITALS — BP 116/70 | HR 89 | Temp 98.1°F | Resp 16 | Ht 62.0 in | Wt 182.8 lb

## 2021-10-26 DIAGNOSIS — S161XXA Strain of muscle, fascia and tendon at neck level, initial encounter: Secondary | ICD-10-CM | POA: Diagnosis not present

## 2021-10-26 DIAGNOSIS — L659 Nonscarring hair loss, unspecified: Secondary | ICD-10-CM

## 2021-10-26 DIAGNOSIS — G5603 Carpal tunnel syndrome, bilateral upper limbs: Secondary | ICD-10-CM | POA: Diagnosis not present

## 2021-10-26 MED ORDER — TIZANIDINE HCL 4 MG PO TABS: 4.0000 mg | ORAL_TABLET | Freq: Four times a day (QID) | ORAL | 0 refills | Status: DC | PRN

## 2021-10-26 NOTE — Patient Instructions (Addendum)
It was great seeing you today!  Plan discussed at today's visit: -For carpal tunnel, try massage and night splint  -For neck strain, moist heat, gentle stretching and low dose muscle relaxer. Can also do Lidocaine and [place], Voltaren etc.  -Referral to Dermatology, we will call you to set up this appointment  Follow up in: as needed   Take care and let us know if you have any questions or concerns prior to your next visit.  Dr. Rosana Berger  Carpal Tunnel Syndrome Carpal tunnel syndrome is a condition that causes pain, numbness, and weakness in your hand and fingers. The carpal tunnel is a narrow area located on the palm side of your wrist. Repeated wrist motion or certain diseases may cause swelling within the tunnel. This swelling pinches the main nerve in the wrist. The main nerve in the wrist is called the median nerve. What are the causes? This condition may be caused by: Repeated and forceful wrist and hand motions. Wrist injuries. Arthritis. A cyst or tumor in the carpal tunnel. Fluid buildup during pregnancy. Use of tools that vibrate. Sometimes the cause of this condition is not known. What increases the risk? The following factors may make you more likely to develop this condition: Having a job that requires you to repeatedly or forcefully move your wrist or hand or requires you to use tools that vibrate. This may include jobs that involve using computers, working on an [employer], or working with [place] such as Pension scheme manager. Being a woman. Having certain conditions, such as: Diabetes. Obesity. An underactive thyroid (hypothyroidism). Kidney failure. Rheumatoid arthritis. What are the signs or symptoms? Symptoms of this condition include: A tingling feeling in your fingers, especially in your thumb, index, and middle fingers. Tingling or numbness in your hand. An aching feeling in your entire arm, especially when your wrist and elbow are bent for a long  time. Wrist pain that goes up your arm to your shoulder. Pain that goes down into your palm or fingers. A weak feeling in your hands. You may have trouble grabbing and holding items. Your symptoms may feel worse during the night. How is this diagnosed? This condition is diagnosed with a medical history and physical exam. You may also have tests, including: Electromyogram (EMG). This test measures electrical signals sent by your nerves into the muscles. Nerve conduction study. This test measures how well electrical signals pass through your nerves. Imaging tests, such as X-rays, ultrasound, and MRI. These tests check for possible causes of your condition. How is this treated? This condition may be treated with: Lifestyle changes. It is important to stop or change the activity that caused your condition. Doing exercise and activities to strengthen and stretch your muscles and tendons (physical therapy). Making lifestyle changes to help with your condition and learning how to do your daily activities safely (occupational therapy). Medicines for pain and inflammation. This may include medicine that is injected into your wrist. A wrist splint or brace. Surgery. Follow these instructions at home: If you have a splint or brace: Wear the splint or brace as told by your health care provider. Remove it only as told by your health care provider. Loosen the splint or brace if your fingers tingle, become numb, or turn cold and blue. Keep the splint or brace clean. If the splint or brace is not waterproof: Do not let it get wet. Cover it with a watertight covering when you take a bath or shower. Managing pain, stiffness,  and swelling If directed, put ice on the painful area. To do this: If you have a removeable splint or brace, remove it as told by your health care provider. Put ice in a plastic bag. Place a towel between your skin and the bag or between the splint or brace and the bag. Leave the  ice on for 20 minutes, 2-3 times a day. Do not fall asleep with the cold pack on your skin. Remove the ice if your skin turns bright red. This is very important. If you cannot feel pain, heat, or cold, you have a greater risk of damage to the area. Move your fingers often to reduce stiffness and swelling. General instructions Take over-the-counter and prescription medicines only as told by your health care provider. Rest your wrist and hand from any activity that may be causing your pain. If your condition is work related, talk with your employer about changes that can be made, such as getting a wrist pad to use while typing. Do any exercises as told by your health care provider, physical therapist, or occupational therapist. Keep all follow-up visits. This is important. Contact a health care provider if: You have new symptoms. Your pain is not controlled with medicines. Your symptoms get worse. Get help right away if: You have severe numbness or tingling in your wrist or hand. Summary Carpal tunnel syndrome is a condition that causes pain, numbness, and weakness in your hand and fingers. It is usually caused by repeated wrist motions. Lifestyle changes and medicines are used to treat carpal tunnel syndrome. Surgery may be recommended. Follow your health care provider's instructions about wearing a splint, resting from activity, keeping follow-up visits, and calling for help. This information is not intended to replace advice given to you by your health care provider. Make sure you discuss any questions you have with your health care provider. Document Revised: 01/23/2020 Document Reviewed: 01/23/2020 Elsevier Patient Education  2022 [place].  Neck Exercises Ask your health care provider which exercises are safe for you. Do exercises exactly as told by your health care provider and adjust them as directed. It is normal to feel mild stretching, pulling, tightness, or discomfort as you do  these exercises. Stop right away if you feel sudden pain or your pain gets worse. Do not begin these exercises until told by your health care provider. Neck exercises can be important for many reasons. They can improve strength and maintain flexibility in your neck, which will help your upper back and prevent neck pain. Stretching exercises Rotation neck stretching Person sitting on chair with feet flat on floor. Person slowly turns head to the right and then the left.  Sit in a chair or stand up. Place your feet flat on the floor, shoulder-width apart. Slowly turn your head (rotate) to the right until a slight stretch is felt. Turn it all the way to the right so you can look over your right shoulder. Do not tilt or tip your head. Hold this position for 10-30 seconds. Slowly turn your head (rotate) to the left until a slight stretch is felt. Turn it all the way to the left so you can look over your left shoulder. Do not tilt or tip your head. Hold this position for 10-30 seconds. Repeat __________ times. Complete this exercise __________ times a day. Neck retraction Person sitting on chair using fingers to push chin backward while facing straight ahead.  Sit in a sturdy chair or stand up. Look straight ahead. Do  not bend your neck. Use your fingers to push your chin backward (retraction). Do not bend your neck for this movement. Continue to face straight ahead. If you are doing the exercise properly, you will feel a slight sensation in your throat and a stretch at the back of your neck. Hold the stretch for 1-2 seconds. Repeat __________ times. Complete this exercise __________ times a day. Strengthening exercises Neck press Person lying on back with a pillow under the head. Person uses neck muscles to push head down and straighten spine.  Lie on your back on a firm bed or on the floor with a pillow under your head. Use your neck muscles to push your head down on the pillow and straighten  your spine. Hold the position as well as you can. Keep your head facing up (in a neutral position) and your chin tucked. Slowly count to 5 while holding this position. Repeat __________ times. Complete this exercise __________ times a day. Isometrics Person sits on chair facing straight ahead. Person pushes head and neck forward and backward while using hand to resist.  These are exercises in which you strengthen the muscles in your neck while keeping your neck still (isometrics). Sit in a supportive chair and place your hand on your forehead. Keep your head and face facing straight ahead. Do not flex or extend your neck while doing isometrics. Push forward with your head and neck while pushing back with your hand. Hold for 10 seconds. Do the sequence again, this time putting your hand against the back of your head. Use your head and neck to push backward against the hand pressure. Finally, do the same exercise on either side of your head, pushing sideways against the pressure of your hand. Repeat __________ times. Complete this exercise __________ times a day. Prone head lifts Person lying face-down propped on elbows. Person starts with head down and then lifts head as far back as is comfortable.  Lie face-down (prone position), resting on your elbows so that your chest and upper back are raised. Start with your head facing downward, near your chest. Position your chin either on or near your chest. Slowly lift your head upward. Lift until you are looking straight ahead. Then continue lifting your head as far back as you can comfortably stretch. Hold your head up for 5 seconds. Then slowly lower it to your starting position. Repeat __________ times. Complete this exercise __________ times a day. Supine head lifts Person lying on his back with knees bent and feet flat on floor. Person lifts head slowly, raising chin toward chest.  Lie on your back (supine position), bending your knees to  point to the ceiling and keeping your feet flat on the floor. Lift your head slowly off the floor, raising your chin toward your chest. Hold for 5 seconds. Repeat __________ times. Complete this exercise __________ times a day. Scapular retraction Person standing with arms at sides. Person pulls both shoulders backward and downward until a stretch is felt.  Stand with your arms at your sides. Look straight ahead. Slowly pull both shoulders (scapulae) backward and downward (retraction) until you feel a stretch between your shoulder blades in your upper back. Hold for 10-30 seconds. Relax and repeat. Repeat __________ times. Complete this exercise __________ times a day. Contact a health care provider if: Your neck pain or discomfort gets worse when you do an exercise. Your neck pain or discomfort does not improve within 2 hours after you exercise. If you have  any of these problems, stop exercising right away. Do not do the exercises again unless your health care provider says that you can. Get help right away if: You develop sudden, severe neck pain. If this happens, stop exercising right away. Do not do the exercises again unless your health care provider says that you can. This information is not intended to replace advice given to you by your health care provider. Make sure you discuss any questions you have with your health care provider. Document Revised: 03/09/2021 Document Reviewed: 03/09/2021 Elsevier Patient Education  [place].

## 2021-10-26 NOTE — Progress Notes (Signed)
Acute Office Visit  Subjective:    Patient ID: Regina Dunlap, female    DOB: Sep 06, 1961, 61 y.o.   MRN: [id number]  Chief Complaint  Patient presents with   Numbness    Bilateral hands   Neck Pain    HPI Patient is in today for numbness in hands and neck pain.    NECK PAIN  Diagnosis: neck bilateral pain, last 2 weeks, bilateral sides and neck  Status: worse Treatments attempted: Lidocaine rolls, IcyHot   Relief with NSAIDs?:  No NSAIDs Taken Location:midline Duration:2 weeks Severity: mild Quality: aching Frequency: intermittent Radiation:  into shoulders Aggravating factors:  worse when waking up Alleviating factors:  IcyHot does help Weakness:  no Paresthesias / decreased sensation:  no  Fevers:  no  NUMBNESS Duration:  1 year, worse in 2 weeks, both hands  Onset: sudden Location: bilateral hands  Bilateral: yes Symmetric: yes Decreased sensation: yes  Weakness: no Pain: no Quality:  burning Severity: moderate  Frequency:  every morning for last 2 weeks  , worse with repetitive action at work Trauma: no Recent illness: no Diabetes: no Treatments attempted: Shaking, cold water helps, worse at night    Past Medical History:  Diagnosis Date   Esophageal reflux    Fatty liver 11/04/2016   Mastodynia    Other and unspecified hyperlipidemia    Overweight (BMI 25.0-29.9) 11/04/2016   Pain in thoracic spine    Prediabetes 03/28/2016   Unspecified essential hypertension    Unspecified sleep apnea    CPAP    Past Surgical History:  Procedure Laterality Date   BREAST BIOPSY Right 1999   neg   BREAST CYST ASPIRATION Right    neg   COLONOSCOPY WITH PROPOFOL N/A 08/07/2020   Procedure: COLONOSCOPY WITH PROPOFOL;  Surgeon: Lucilla Lame, MD;  Location: [facility];  Service: Endoscopy;  Laterality: N/A;  Sleep apnea priority 4   TUBAL LIGATION      Family History  Problem Relation Age of Onset   Migraines Mother    Stroke Father     Hypertension Father    Hypertension Sister    Anuerysm Brother    Hypertension Sister    Cancer Sister        cervical   Cancer Sister        colon   Breast cancer Neg Hx     Social History   Socioeconomic History   Marital status: Married    Spouse name: Not on file   Number of children: Not on file   Years of education: Not on file   Highest education level: Not on file  Occupational History   Not on file  Tobacco Use   Smoking status: Never   Smokeless tobacco: Never  Vaping Use   Vaping Use: Never used  Substance and Sexual Activity   Alcohol use: Yes    Comment: social   Drug use: No   Sexual activity: Yes  Other Topics Concern   Not on file  Social History Narrative   Not on file   Social Determinants of Health   Financial Resource Strain: Not on file  Food Insecurity: Not on file  Transportation Needs: Not on file  Physical Activity: Not on file  Stress: Not on file  Social Connections: Not on file  Intimate Partner Violence: Not on file    Outpatient Medications Prior to Visit  Medication Sig Dispense Refill   calcium gluconate 500 MG tablet Take 1 tablet by mouth  3 (three) times daily.     cholecalciferol (VITAMIN D3) 25 MCG (1000 UNIT) tablet Take 5,000 Units by mouth daily.     fluticasone (FLONASE) 50 MCG/ACT nasal spray Place 2 sprays into both nostrils daily. 16 g 6   Krill Oil 1000 MG CAPS Take 1 capsule daily by mouth.      loratadine (CLARITIN) 10 MG tablet TAKE 1 TABLET(10 MG) BY MOUTH DAILY AS NEEDED FOR ALLERGIES 30 tablet 1   Magnesium 100 MG CAPS Take 1,000 mg by mouth.     olmesartan-hydrochlorothiazide (BENICAR HCT) 40-25 MG tablet Take 1 tablet by mouth daily. 90 tablet 3   omeprazole (PRILOSEC) 20 MG capsule Take 20 mg by mouth daily as needed (GERD, reflux).     simvastatin (ZOCOR) 20 MG tablet Take 1 tablet (20 mg total) by mouth daily at 6 PM. 90 tablet 3   TURMERIC PO Take by mouth daily. Reported on 03/28/2016     vitamin B-12  (CYANOCOBALAMIN) 100 MCG tablet Take 100 mcg by mouth daily.     No facility-administered medications prior to visit.    Allergies  Allergen Reactions   Amoxil [Amoxicillin]    Ciprofloxacin Diarrhea and Nausea And Vomiting   Lisinopril Cough   Migraine Formula [Aspirin-Acetaminophen-Caffeine] Rash    Midrina; broke out in rash    Povidone Iodine Rash    Review of Systems  Constitutional:  Negative for chills and fever.  Musculoskeletal:  Positive for neck pain.  Skin: Negative.   Neurological:  Positive for numbness. Negative for weakness.      Objective:    Physical Exam Constitutional:      Appearance: Normal appearance.  HENT:     Head: Normocephalic and atraumatic.  Eyes:     Conjunctiva/sclera: Conjunctivae normal.  Neck:     Comments: Hypertonicity in paraspinal muscles at the level of C5-7 Cardiovascular:     Rate and Rhythm: Normal rate and regular rhythm.  Pulmonary:     Effort: Pulmonary effort is normal.     Breath sounds: Normal breath sounds.  Musculoskeletal:     Cervical back: Tenderness present.     Right lower leg: No edema.     Left lower leg: No edema.  Skin:    General: Skin is warm and dry.  Neurological:     General: No focal deficit present.     Mental Status: She is alert. Mental status is at baseline.     Sensory: No sensory deficit.     Motor: No weakness.     Comments: Positive Phalen's and reverse Phalen's  Psychiatric:        Mood and Affect: Mood normal.        Behavior: Behavior normal.    BP 116/70    Pulse 89    Temp 98.1 F (36.7 C)    Resp 16    Ht '5\' 2"'  (1.575 m)    Wt 182 lb 12.8 oz (82.9 kg)    SpO2 96%    BMI 33.43 kg/m  Wt Readings from Last 3 Encounters:  10/25/21 179 lb (81.2 kg)  08/26/21 181 lb 9.6 oz (82.4 kg)  08/12/21 181 lb 3.2 oz (82.2 kg)    Health Maintenance Due  Topic Date Due   COVID-19 Vaccine (4 - Booster for Pfizer series) 03/06/2021    There are no preventive care reminders to display for  this patient.   Lab Results  Component Value Date   TSH 2.03 10/15/2019   Lab  Results  Component Value Date   WBC 6.2 02/09/2021   HGB 12.7 02/09/2021   HCT 38.9 02/09/2021   MCV 87.4 02/09/2021   PLT 227 02/09/2021   Lab Results  Component Value Date   NA 139 08/12/2021   K 4.0 08/12/2021   CO2 28 08/12/2021   GLUCOSE 89 08/12/2021   BUN 23 08/12/2021   CREATININE 1.30 (H) 08/12/2021   BILITOT 0.4 08/12/2021   ALKPHOS 76 04/24/2017   AST 20 08/12/2021   ALT 20 08/12/2021   PROT 6.9 08/12/2021   ALBUMIN 4.3 04/24/2017   CALCIUM 9.1 08/12/2021   EGFR 48 (L) 08/12/2021   Lab Results  Component Value Date   CHOL 156 08/12/2021   Lab Results  Component Value Date   HDL 52 08/12/2021   Lab Results  Component Value Date   LDLCALC 79 08/12/2021   Lab Results  Component Value Date   TRIG 146 08/12/2021   Lab Results  Component Value Date   CHOLHDL 3.0 08/12/2021   Lab Results  Component Value Date   HGBA1C 6.1 (H) 02/09/2021       Assessment & Plan:   1. Strain of neck muscle, initial encounter: Discussed conservative measures, moist heat, gentle stretching, topicals like IcyHot, etc and low dose muscle relaxer.  - tiZANidine (ZANAFLEX) 4 MG tablet; Take 1 tablet (4 mg total) by mouth every 6 (six) hours as needed for muscle spasms.  Dispense: 30 tablet; Refill: 0  2. Alopecia: Requesting referral to Derm for alopecia.   - Ambulatory referral to Dermatology  3. Bilateral carpal tunnel syndrome: Discussed massage, stretching and night splint. Next step would include ortho referral for steroid injection vs. Surgery,    Teodora Medici, DO

## 2021-10-31 ENCOUNTER — Other Ambulatory Visit: Payer: Self-pay | Admitting: Internal Medicine

## 2021-10-31 DIAGNOSIS — S161XXA Strain of muscle, fascia and tendon at neck level, initial encounter: Secondary | ICD-10-CM

## 2021-11-01 NOTE — Telephone Encounter (Signed)
Requested medication (s) are due for refill today: had #30 10/26/21  Requested medication (s) are on the active medication list: yes  Last refill:  10/26/21 #30 with 0 RF  Future visit scheduled: 02/10/22  Notes to clinic:  This medication can not be delegated, please assess.    Requested Prescriptions  Pending Prescriptions Disp Refills   tiZANidine (ZANAFLEX) 4 MG tablet [Pharmacy Med Name: TIZANIDINE 4MG  TABLETS] 30 tablet 0    Sig: TAKE 1 TABLET(4 MG) BY MOUTH EVERY 6 HOURS AS NEEDED FOR MUSCLE SPASMS     Not Delegated - Cardiovascular:  Alpha-2 Agonists - tizanidine Failed - 10/31/2021  3:39 AM      Failed - This refill cannot be delegated      Passed - Valid encounter within last 6 months    Recent Outpatient Visits           6 days ago Strain of neck muscle, initial encounter   [place], DO   1 month ago Viral upper respiratory tract infection   [facility] Serafina Royals F, FNP   2 months ago Acute bilateral low back pain with bilateral sciatica   [facility] Serafina Royals F, FNP   2 months ago Decreased calculated glomerular filtration rate (GFR)   [facility] Kathrine Haddock, NP   6 months ago COVID-19   [facility] Kathrine Haddock, NP       Future Appointments             In 3 months Teodora Medici, [facility], [place]   In 5 months Ralene Bathe, MD [place]

## 2021-11-29 ENCOUNTER — Encounter: Payer: Self-pay | Admitting: Nurse Practitioner

## 2021-11-29 ENCOUNTER — Ambulatory Visit: Payer: Self-pay

## 2021-11-29 ENCOUNTER — Ambulatory Visit (INDEPENDENT_AMBULATORY_CARE_PROVIDER_SITE_OTHER): Payer: BC Managed Care – PPO | Admitting: Nurse Practitioner

## 2021-11-29 ENCOUNTER — Other Ambulatory Visit: Payer: Self-pay

## 2021-11-29 VITALS — BP 124/72 | HR 74 | Temp 98.2°F | Resp 14 | Ht 62.0 in | Wt 183.4 lb

## 2021-11-29 DIAGNOSIS — S161XXD Strain of muscle, fascia and tendon at neck level, subsequent encounter: Secondary | ICD-10-CM | POA: Diagnosis not present

## 2021-11-29 DIAGNOSIS — G5603 Carpal tunnel syndrome, bilateral upper limbs: Secondary | ICD-10-CM

## 2021-11-29 DIAGNOSIS — M7552 Bursitis of left shoulder: Secondary | ICD-10-CM

## 2021-11-29 MED ORDER — TIZANIDINE HCL 4 MG PO TABS: 4.0000 mg | ORAL_TABLET | Freq: Four times a day (QID) | ORAL | 0 refills | Status: DC | PRN

## 2021-11-29 NOTE — Telephone Encounter (Signed)
?  Chief Complaint: Arm pain ?Symptoms: Arm pain ?Frequency: since last week ?Pertinent Negatives: Patient denies SOB, radiating pain, jaw pain ?Disposition: '[]'$ ED /'[]'$ Urgent Care (no appt availability in office) / '[x]'$ Appointment(In office/virtual)/ '[]'$  [facility]/ '[]'$ Home Care/ '[]'$ Refused Recommended Disposition /'[]'$ [facility]/ '[]'$  Follow-up with PCP ?Additional Notes: Pt seen for arm numbness and given muscle relaxant. Pt is now experiencing pain in same arm. Appt made for today. ? ? ? ? ?Reason for Disposition ? [1] MODERATE pain (e.g., interferes with normal activities) AND [2] present > 3 days ? ?Answer Assessment - Initial Assessment Questions ?1. ONSET: "When did the pain start?" ?    A couple of days  ?2. LOCATION: "Where is the pain located?" ?    Left arm outside of arm ?3. PAIN: "How bad is the pain?" (Scale 1-10; or mild, moderate, severe) ?  - MILD (1-3): doesn't interfere with normal activities ?  - MODERATE (4-7): interferes with normal activities (e.g., work or school) or awakens from sleep ?  - SEVERE (8-10): excruciating pain, unable to do any normal activities, unable to hold a cup of water ?    3 ?4. WORK OR EXERCISE: "Has there been any recent work or exercise that involved this part of the body?" ?    no ?5. CAUSE: "What do you think is causing the arm pain?" ?    unsure ?6. OTHER SYMPTOMS: "Do you have any other symptoms?" (e.g., neck pain, swelling, rash, fever, numbness, weakness) ?    numbenss ?7. PREGNANCY: "Is there any chance you are pregnant?" "When was your last menstrual period?" ?    na ? ?Protocols used: Arm Pain-A-AH ? ?

## 2021-11-29 NOTE — Progress Notes (Signed)
? ?BP 124/72   Pulse 74   Temp 98.2 ?F (36.8 ?C) (Oral)   Resp 14   Ht '5\' 2"'  (1.575 m)   Wt 183 lb 6.4 oz (83.2 kg)   SpO2 98%   BMI 33.54 kg/m?   ? ?Subjective:  ? ? Patient ID: Regina Dunlap, female    DOB: 26-Feb-1961, 61 y.o.   MRN: [id number] ? ?HPI: ?Regina Dunlap is a 61 y.o. female ? ?Chief Complaint  ?Patient presents with  ? Arm Pain  ?   ?Left   ? ?Left arm pain: Left arm pain comes on suddenly, off and on.  She says that the pain is just in her upper arm, not her shoulder.  She has full range of motion. She denies any chest pain, shortness of breath, diaphoresis.  She denies any known injury.  She denies any tenderness upon palpation. Got EKG which showed normal sinus rhythm. Discussed likely deltoid bursitis. Will start Voltaren gel. Discussed stretches to try to help with pain relief.  ? ?Bilateral carpel tunnel: She says she has continue to have bilateral wrist pain and numbness.  She says she saw Dr Rosana Berger and was told if the pain continued would refer to ortho.  Referral placed.  ? ?Stiff neck: She says when she saw Dr. Rosana Berger she was give a muscle relaxer that helped. She says she still has the pain.  She says the muscle relaxer does help, she says she only takes it if the pain is really bad. Discussed will give refill and to follow up with ortho.  ? ?Relevant past medical, surgical, family and social history reviewed and updated as indicated. Interim medical history since our last visit reviewed. ?Allergies and medications reviewed and updated. ? ?Review of Systems ? ?Constitutional: Negative for fever or weight change.  ?Respiratory: Negative for cough and shortness of breath.   ?Cardiovascular: Negative for chest pain or palpitations.  ?Gastrointestinal: Negative for abdominal pain, no bowel changes.  ?Musculoskeletal: Negative for gait problem or joint swelling. Right side neck pain, bilateral wrist pain, left arm pain ?Skin: Negative for rash.  ?Neurological: Negative  for dizziness or headache.  ?No other specific complaints in a complete review of systems (except as listed in HPI above).  ? ?   ?Objective:  ?  ?BP 124/72   Pulse 74   Temp 98.2 ?F (36.8 ?C) (Oral)   Resp 14   Ht '5\' 2"'  (1.575 m)   Wt 183 lb 6.4 oz (83.2 kg)   SpO2 98%   BMI 33.54 kg/m?   ?Wt Readings from Last 3 Encounters:  ?11/29/21 183 lb 6.4 oz (83.2 kg)  ?10/26/21 182 lb 12.8 oz (82.9 kg)  ?10/25/21 179 lb (81.2 kg)  ?  ?Physical Exam ? ?Constitutional: Patient appears well-developed and well-nourished. Obese  No distress.  ?HEENT: head atraumatic, normocephalic, pupils equal and reactive to light, neck supple ?Cardiovascular: Normal rate, regular rhythm and normal heart sounds.  No murmur heard. No BLE edema. ?Pulmonary/Chest: Effort normal and breath sounds normal. No respiratory distress. ?Abdominal: Soft.  There is no tenderness. ?MSK: full range of motion, no tenderness noted ?Psychiatric: Patient has a normal mood and affect. behavior is normal. Judgment and thought content normal.  ? ?Results for orders placed or performed in visit on 08/12/21  ?Lipid panel  ?Result Value Ref Range  ? Cholesterol 156 <200 mg/dL  ? HDL 52 > OR = 50 mg/dL  ? Triglycerides 146 <150 mg/dL  ? LDL Cholesterol (  Calc) 79 mg/dL (calc)  ? Total CHOL/HDL Ratio 3.0 <5.0 (calc)  ? Non-HDL Cholesterol (Calc) 104 <130 mg/dL (calc)  ?COMPLETE METABOLIC PANEL WITH GFR  ?Result Value Ref Range  ? Glucose, Bld 89 65 - 99 mg/dL  ? BUN 23 7 - 25 mg/dL  ? Creat 1.30 (H) 0.50 - 1.03 mg/dL  ? eGFR 48 (L) > OR = 60 mL/min/1.55m  ? BUN/Creatinine Ratio 18 6 - 22 (calc)  ? Sodium 139 135 - 146 mmol/L  ? Potassium 4.0 3.5 - 5.3 mmol/L  ? Chloride 103 98 - 110 mmol/L  ? CO2 28 20 - 32 mmol/L  ? Calcium 9.1 8.6 - 10.4 mg/dL  ? Total Protein 6.9 6.1 - 8.1 g/dL  ? Albumin 4.1 3.6 - 5.1 g/dL  ? Globulin 2.8 1.9 - 3.7 g/dL (calc)  ? AG Ratio 1.5 1.0 - 2.5 (calc)  ? Total Bilirubin 0.4 0.2 - 1.2 mg/dL  ? Alkaline phosphatase (APISO) 81 37 - 153  U/L  ? AST 20 10 - 35 U/L  ? ALT 20 6 - 29 U/L  ? ?   ?Assessment & Plan:  ? ?1. Deltoid bursitis, left ?-try Voltaren gel ?-try stretches ?-heat therapy ? ?2. Bilateral carpal tunnel syndrome ? ?- Ambulatory referral to Orthopedic Surgery ? ?3. Strain of neck muscle, subsequent encounter ? ?- tiZANidine (ZANAFLEX) 4 MG tablet; Take 1 tablet (4 mg total) by mouth every 6 (six) hours as needed for muscle spasms.  Dispense: 30 tablet; Refill: 0  ? ?Follow up plan: ?Return if symptoms worsen or fail to improve. ? ? ? ? ? ?

## 2021-11-29 NOTE — Telephone Encounter (Signed)
Came in today for appointment ?

## 2021-11-30 DIAGNOSIS — M5412 Radiculopathy, cervical region: Secondary | ICD-10-CM | POA: Diagnosis not present

## 2021-12-04 ENCOUNTER — Other Ambulatory Visit: Payer: Self-pay | Admitting: Nurse Practitioner

## 2021-12-04 DIAGNOSIS — S161XXD Strain of muscle, fascia and tendon at neck level, subsequent encounter: Secondary | ICD-10-CM

## 2021-12-06 ENCOUNTER — Other Ambulatory Visit: Payer: Self-pay

## 2021-12-06 NOTE — Telephone Encounter (Signed)
Requested medication (s) are due for refill today: no ? ?Requested medication (s) are on the active medication list: yes ? ?Last refill:  11/29/21 #30/0 ? ?Future visit scheduled: yes ? ?Notes to clinic:  Unable to refill per protocol, cannot delegate. ? ? ? ?  ?Requested Prescriptions  ?Pending Prescriptions Disp Refills  ? tiZANidine (ZANAFLEX) 4 MG tablet [Pharmacy Med Name: TIZANIDINE '4MG'$  TABLETS] 30 tablet 0  ?  Sig: TAKE 1 TABLET(4 MG) BY MOUTH EVERY 6 HOURS AS NEEDED FOR MUSCLE SPASMS  ?  ? Not Delegated - Cardiovascular:  Alpha-2 Agonists - tizanidine Failed - 12/04/2021  3:37 AM  ?  ?  Failed - This refill cannot be delegated  ?  ?  Passed - Valid encounter within last 6 months  ?  Recent Outpatient Visits   ? ?      ? 1 week ago Deltoid bursitis, left  ? [facility] Serafina Royals F, FNP  ? 1 month ago Strain of neck muscle, initial encounter  ? [facility] Teodora Medici, DO  ? 2 months ago Viral upper respiratory tract infection  ? [facility] Serafina Royals F, FNP  ? 3 months ago Acute bilateral low back pain with bilateral sciatica  ? [facility] Serafina Royals F, FNP  ? 3 months ago Decreased calculated glomerular filtration rate (GFR)  ? [facility] Kathrine Haddock, NP  ? ?  ?  ?Future Appointments   ? ?        ? In 2 weeks Furth, Cadence H, PA-C [place], [facility]  ? In 2 months Teodora Medici, [facility], [place]  ? In 4 months Ralene Bathe, MD [place]  ? ?  ? ?  ?  ?  ? ?

## 2021-12-20 ENCOUNTER — Other Ambulatory Visit: Payer: Self-pay

## 2021-12-20 ENCOUNTER — Encounter: Payer: Self-pay | Admitting: Medical

## 2021-12-20 ENCOUNTER — Ambulatory Visit (INDEPENDENT_AMBULATORY_CARE_PROVIDER_SITE_OTHER): Payer: BC Managed Care – PPO | Admitting: Medical

## 2021-12-20 VITALS — BP 120/78 | HR 69 | Ht 62.0 in | Wt 182.0 lb

## 2021-12-20 DIAGNOSIS — E782 Mixed hyperlipidemia: Secondary | ICD-10-CM | POA: Diagnosis not present

## 2021-12-20 DIAGNOSIS — I10 Essential (primary) hypertension: Secondary | ICD-10-CM

## 2021-12-20 DIAGNOSIS — G4733 Obstructive sleep apnea (adult) (pediatric): Secondary | ICD-10-CM | POA: Diagnosis not present

## 2021-12-20 DIAGNOSIS — R0609 Other forms of dyspnea: Secondary | ICD-10-CM | POA: Diagnosis not present

## 2021-12-20 NOTE — Progress Notes (Signed)
?Cardiology Office Note:   ? ?Date:  12/20/2021  ? ?ID:  Regina Dunlap, DOB 21-Nov-1960, MRN [id number] ? ?PCP:  Teodora Medici, DO  ?[facility] Cardiologist:  Kathlyn Sacramento, MD  ?[facility] Electrophysiologist:  None  ? ?Referring MD: Delsa Grana, PA-C  ? ?Chief Complaint: over due follow-up ? ?History of Present Illness:   ? ?Regina Dunlap is a 61 y.o. female with a hx of HTN, HLD, hepatic steatosis, obesity, sleep apnea on CPAP, GERD, exertional dyspnea who presents for overdue follow-up.  ? ?Echo in 2015 showed normal EF. Treadmill test was normal. She underwent MPI for exertional dyspnea tat showed no evidence of ischemia. Repeat echo 2021 showed normal LCSF, G1DD, and no significant valvular abnormalities.  ? ?She was last seen 04/2020 and it was suspected exertional dyspnea was non-cardiac.  ? ?Today, the patient reports exertional dyspnea. Unable to climb multiple flights of stairs. SOB has been intermittent for the last few months. Also has been having hand numbness, may be from pinched nerve in the neck. Patient denies chest pain, LLE, orthopnea, pnd, palpitations. She uses her CPAP. Patient walks 30-60 minutes daily on a hill. Will get mildly short of breath. Says diet is good, recently eating much healthier. She denies smoking history. ? ?Past Medical History:  ?Diagnosis Date  ? Esophageal reflux   ? Fatty liver 11/04/2016  ? Mastodynia   ? Other and unspecified hyperlipidemia   ? Overweight (BMI 25.0-29.9) 11/04/2016  ? Pain in thoracic spine   ? Prediabetes 03/28/2016  ? Unspecified essential hypertension   ? Unspecified sleep apnea   ? CPAP  ? ? ?Past Surgical History:  ?Procedure Laterality Date  ? BREAST BIOPSY Right 1999  ? neg  ? BREAST CYST ASPIRATION Right   ? neg  ? COLONOSCOPY WITH PROPOFOL N/A 08/07/2020  ? Procedure: COLONOSCOPY WITH PROPOFOL;  Surgeon: Lucilla Lame, MD;  Location: [facility];  Service: Endoscopy;  Laterality: N/A;  Sleep apnea ?priority 4  ?  TUBAL LIGATION    ? ? ?Current Medications: ?Current Meds  ?Medication Sig  ? calcium gluconate 500 MG tablet Take 1 tablet by mouth 3 (three) times daily.  ? cholecalciferol (VITAMIN D3) 25 MCG (1000 UNIT) tablet Take 5,000 Units by mouth daily.  ? fluticasone (FLONASE) 50 MCG/ACT nasal spray Place 2 sprays into both nostrils daily.  ? Krill Oil 1000 MG CAPS Take 1 capsule daily by mouth.   ? Magnesium 100 MG CAPS Take 1,000 mg by mouth.  ? olmesartan-hydrochlorothiazide (BENICAR HCT) 40-25 MG tablet Take 1 tablet by mouth daily.  ? omeprazole (PRILOSEC) 20 MG capsule Take 20 mg by mouth daily as needed (GERD, reflux).  ? simvastatin (ZOCOR) 20 MG tablet Take 1 tablet (20 mg total) by mouth daily at 6 PM.  ? tiZANidine (ZANAFLEX) 4 MG tablet Take 1 tablet (4 mg total) by mouth every 6 (six) hours as needed for muscle spasms.  ? TURMERIC PO Take by mouth daily. Reported on 03/28/2016  ?  ? ?Allergies:   Amoxil [amoxicillin], Ciprofloxacin, Lisinopril, Migraine formula [aspirin-acetaminophen-caffeine], and Povidone iodine  ? ?Social History  ? ?Socioeconomic History  ? Marital status: Married  ?  Spouse name: Not on file  ? Number of children: Not on file  ? Years of education: Not on file  ? Highest education level: Not on file  ?Occupational History  ? Not on file  ?Tobacco Use  ? Smoking status: Never  ? Smokeless tobacco: Never  ?Vaping  Use  ? Vaping Use: Never used  ?Substance and Sexual Activity  ? Alcohol use: Yes  ?  Comment: social  ? Drug use: No  ? Sexual activity: Yes  ?Other Topics Concern  ? Not on file  ?Social History Narrative  ? Not on file  ? ?Social Determinants of Health  ? ?Financial Resource Strain: Not on file  ?Food Insecurity: Not on file  ?Transportation Needs: Not on file  ?Physical Activity: Not on file  ?Stress: Not on file  ?Social Connections: Not on file  ?  ? ?Family History: ?The patient's family history includes Anuerysm in her brother; Cancer in her sister and sister; Hypertension  in her father, sister, and sister; Migraines in her mother; Stroke in her father. There is no history of Breast cancer. ? ?ROS:   ?Please see the history of present illness.    ? All other systems reviewed and are negative. ? ?EKGs/Labs/Other Studies Reviewed:   ? ?The following studies were reviewed today: ? MPI 09/2019 ?Narrative & Impression  ?There was no ST segment deviation noted during stress. ?No T wave inversion was noted during stress. ?The study is normal. ?This is a low risk study. ?The left ventricular ejection fraction is normal (69%). ?No evidence for ischemia ?   ?This result has not been signed. Information might be incomplete.  ? ? ?Echo 11/2019 ? 1. Left ventricular ejection fraction, by estimation, is 60 to 65%. The  ?left ventricle has normal function. The left ventricle has no regional  ?wall motion abnormalities. Left ventricular diastolic parameters are  ?consistent with Grade I diastolic  ?dysfunction (impaired relaxation).  ? 2. Right ventricular systolic function is normal. The right ventricular  ?size is normal.  ? ?EKG:  EKG is  ordered today.  The ekg ordered today demonstrates NSR 69bpm, nonspecific T wave changes. No changes from prior ? ?Recent Labs: ?02/09/2021: Hemoglobin 12.7; Platelets 227 ?08/12/2021: ALT 20; BUN 23; Creat 1.30; Potassium 4.0; Sodium 139  ?Recent Lipid Panel ?   ?Component Value Date/Time  ? CHOL 156 08/12/2021 1116  ? CHOL 181 10/13/2015 0911  ? TRIG 146 08/12/2021 1116  ? HDL 52 08/12/2021 1116  ? HDL 54 10/13/2015 0911  ? CHOLHDL 3.0 08/12/2021 1116  ? VLDL 38 (H) 04/24/2017 1528  ? [place] 79 08/12/2021 1116  ? ? ? ?Physical Exam:   ? ?VS:  BP 120/78 (BP Location: Left Arm, Patient Position: Sitting, Cuff Size: Normal)   Pulse 69   Ht '5\' 2"'$  (1.575 m)   Wt 182 lb (82.6 kg)   SpO2 98%   BMI 33.29 kg/m?    ? ?Wt Readings from Last 3 Encounters:  ?12/20/21 182 lb (82.6 kg)  ?11/29/21 183 lb 6.4 oz (83.2 kg)  ?10/26/21 182 lb 12.8 oz (82.9 kg)  ?  ? ?GEN:  Well  nourished, well developed in no acute distress ?HEENT: Normal ?NECK: No JVD; No carotid bruits ?LYMPHATICS: No lymphadenopathy ?CARDIAC: RRR, no murmurs, rubs, gallops ?RESPIRATORY:  Clear to auscultation without rales, wheezing or rhonchi  ?ABDOMEN: Soft, non-tender, non-distended ?MUSCULOSKELETAL:  No edema; No deformity  ?SKIN: Warm and dry ?NEUROLOGIC:  Alert and oriented x 3 ?PSYCHIATRIC:  Normal affect  ? ?ASSESSMENT:   ? ?1. DOE (dyspnea on exertion)   ?2. Essential hypertension   ?3. Hyperlipidemia, mixed   ?4. OSA (obstructive sleep apnea)   ? ?PLAN:   ? ?In order of problems listed above: ? ?Exertional dyspnea ?Reports intermittent dyspnea on exertion, not  worse than before. Prior echo and stress test in 2021 were overall reassuring. She is walking daily 30-60 minutes. No chest pain reported. She eats healthy. EKG with no changes. Symptoms may be general deconditioning. I will order BMET and CBC. I will repeat an echo. If symptoms persist can consider ETT.  ? ?HTN ?BP good today. Continue Benicar-HCT.  ? ?HLD ?LDL 79. Continue statin.  ? ?OSA ?Reports compliance with CPAP.  ? ?Disposition: Follow up in 1 month(s) with MD/APP  ? ? ?Signed, ?Finesse Fielder Ninfa Meeker, PA-C  ?12/20/2021 9:02 AM    ?[place]  ?

## 2021-12-20 NOTE — Patient Instructions (Signed)
Medication Instructions:  ? ?Your physician recommends that you continue on your current medications as directed. Please refer to the Current Medication list given to you today.  ? ?*If you need a refill on your cardiac medications before your next appointment, please call your pharmacy* ? ? ?Lab Work: ? ?Today: CBC, BMET ? ?If you have labs (blood work) drawn today and your tests are completely normal, you will receive your results only by: ?MyChart Message (if you have MyChart) OR ?A paper copy in the mail ?If you have any lab test that is abnormal or we need to change your treatment, we will call you to review the results. ? ? ?Testing/Procedures: ? ?Your physician has requested that you have an echocardiogram. Echocardiography is a painless test that uses sound waves to create images of your heart. It provides your doctor with information about the size and shape of your heart and how well your heart?s chambers and valves are working. This procedure takes approximately one hour. There are no restrictions for this procedure.  ? ? ?Follow-Up: ?At [facility], you and your health needs are our priority.  As part of our continuing mission to provide you with exceptional heart care, we have created designated Provider Care Teams.  These Care Teams include your primary Cardiologist (physician) and Advanced Practice Providers (APPs -  Physician Assistants and Nurse Practitioners) who all work together to provide you with the care you need, when you need it. ? ?We recommend signing up for the patient portal called "MyChart".  Sign up information is provided on this After Visit Summary.  MyChart is used to connect with patients for Virtual Visits (Telemedicine).  Patients are able to view lab/test results, encounter notes, upcoming appointments, etc.  Non-urgent messages can be sent to your provider as well.   ?To learn more about what you can do with MyChart, go to [email or web address].   ? ?Your next appointment:    ?1-2 month(s) ? ?The format for your next appointment:   ?In Person ? ?Provider:   ?You may see Kathlyn Sacramento, MD or one of the following Advanced Practice Providers on your designated Care Team:   ?Murray Hodgkins, NP ?Christell Faith, PA-C ?Cadence Kathlen Mody, PA-C ? ? ?Other Instructions ?N/A ?

## 2021-12-21 LAB — CBC
Hematocrit: 38 % (ref 34.0–46.6)
Hemoglobin: 12.8 g/dL (ref 11.1–15.9)
MCH: 28.6 pg (ref 26.6–33.0)
MCHC: 33.7 g/dL (ref 31.5–35.7)
MCV: 85 fL (ref 79–97)
Platelets: 207 10*3/uL (ref 150–450)
RBC: 4.47 x10E6/uL (ref 3.77–5.28)
RDW: 12.5 % (ref 11.7–15.4)
WBC: 4.4 10*3/uL (ref 3.4–10.8)

## 2021-12-21 LAB — BASIC METABOLIC PANEL
BUN/Creatinine Ratio: 12 (ref 9–23)
BUN: 10 mg/dL (ref 6–24)
CO2: 24 mmol/L (ref 20–29)
Calcium: 9.1 mg/dL (ref 8.7–10.2)
Chloride: 108 mmol/L — ABNORMAL HIGH (ref 96–106)
Creatinine, Ser: 0.82 mg/dL (ref 0.57–1.00)
Glucose: 117 mg/dL — ABNORMAL HIGH (ref 70–99)
Potassium: 3.9 mmol/L (ref 3.5–5.2)
Sodium: 144 mmol/L (ref 134–144)
eGFR: 83 mL/min/{1.73_m2} (ref >59)

## 2021-12-22 DIAGNOSIS — M436 Torticollis: Secondary | ICD-10-CM | POA: Diagnosis not present

## 2021-12-22 DIAGNOSIS — M542 Cervicalgia: Secondary | ICD-10-CM | POA: Diagnosis not present

## 2022-01-04 ENCOUNTER — Ambulatory Visit (INDEPENDENT_AMBULATORY_CARE_PROVIDER_SITE_OTHER): Payer: BC Managed Care – PPO

## 2022-01-04 DIAGNOSIS — M542 Cervicalgia: Secondary | ICD-10-CM | POA: Diagnosis not present

## 2022-01-04 DIAGNOSIS — R0609 Other forms of dyspnea: Secondary | ICD-10-CM

## 2022-01-04 DIAGNOSIS — M436 Torticollis: Secondary | ICD-10-CM | POA: Diagnosis not present

## 2022-01-04 LAB — ECHOCARDIOGRAM COMPLETE
AR max vel: 2.2 cm2
AV Area VTI: 2.29 cm2
AV Area mean vel: 2.18 cm2
AV Mean grad: 5 mmHg
AV Peak grad: 9.7 mmHg
Ao pk vel: 1.56 m/s
Area-P 1/2: 1.91 cm2
Calc EF: 59.1 %
S' Lateral: 2.8 cm
Single Plane A2C EF: 56.9 %
Single Plane A4C EF: 61.6 %

## 2022-01-05 ENCOUNTER — Telehealth: Payer: Self-pay

## 2022-01-05 NOTE — Telephone Encounter (Signed)
-----   Message from [place], PA-C sent at 01/05/2022  3:36 PM EDT ----- ?Echo showed normal pump function with impaired relaxation, mildly leaky valve. Can we ask how her breathing is? ?

## 2022-01-05 NOTE — Telephone Encounter (Signed)
Called to give the patient echo results. Lmtcb. 

## 2022-01-10 DIAGNOSIS — R0609 Other forms of dyspnea: Secondary | ICD-10-CM

## 2022-01-10 NOTE — Telephone Encounter (Signed)
Patient made aware of echo results with verbalized understanding. ? ?Pt sts that her breathing is the same. Its usually with exertion and improves with rest. Adv the patient that I will fwd the update to Cadence Furth, [place] and call back if she has additional recommendations prior to her May f/u appt. ?

## 2022-01-10 NOTE — Telephone Encounter (Signed)
Patient is returning RN's call.

## 2022-01-10 NOTE — Telephone Encounter (Signed)
Furth, Cadence H, PA-C  You 2 hours ago (12:50 PM)  ? ?If symptoms are the same, lets order an cardiopulmonary function test.    ? ?Patient request a mychart message with Cadence's response. ?Mychart msg sent. ?

## 2022-01-11 DIAGNOSIS — M436 Torticollis: Secondary | ICD-10-CM | POA: Diagnosis not present

## 2022-01-11 DIAGNOSIS — M542 Cervicalgia: Secondary | ICD-10-CM | POA: Diagnosis not present

## 2022-01-11 DIAGNOSIS — M5412 Radiculopathy, cervical region: Secondary | ICD-10-CM | POA: Diagnosis not present

## 2022-01-12 NOTE — Telephone Encounter (Signed)
Patient agreeable with proceeding with the recommended Cpx. ?Order placed. ? ?Patients Cpx scheduled on 01/26/22 @ 11 am @ [employer]. ?Parking garage code for May "1002" ?Msg to precert. ? ?Pre-test instructions and appt date, time, and location sent to the patient through mychart. ?  ?

## 2022-01-24 ENCOUNTER — Ambulatory Visit: Payer: BC Managed Care – PPO | Admitting: Internal Medicine

## 2022-01-25 ENCOUNTER — Encounter: Payer: Self-pay | Admitting: Internal Medicine

## 2022-01-25 ENCOUNTER — Ambulatory Visit (INDEPENDENT_AMBULATORY_CARE_PROVIDER_SITE_OTHER): Payer: BC Managed Care – PPO | Admitting: Internal Medicine

## 2022-01-25 VITALS — BP 134/82 | HR 68 | Temp 97.8°F | Resp 16 | Ht 62.0 in | Wt 179.6 lb

## 2022-01-25 DIAGNOSIS — E782 Mixed hyperlipidemia: Secondary | ICD-10-CM

## 2022-01-25 DIAGNOSIS — G4733 Obstructive sleep apnea (adult) (pediatric): Secondary | ICD-10-CM

## 2022-01-25 DIAGNOSIS — Z9989 Dependence on other enabling machines and devices: Secondary | ICD-10-CM

## 2022-01-25 DIAGNOSIS — E559 Vitamin D deficiency, unspecified: Secondary | ICD-10-CM

## 2022-01-25 DIAGNOSIS — R7303 Prediabetes: Secondary | ICD-10-CM | POA: Diagnosis not present

## 2022-01-25 DIAGNOSIS — I10 Essential (primary) hypertension: Secondary | ICD-10-CM

## 2022-01-25 DIAGNOSIS — K219 Gastro-esophageal reflux disease without esophagitis: Secondary | ICD-10-CM

## 2022-01-25 MED ORDER — OMEPRAZOLE 20 MG PO CPDR: 20.0000 mg | DELAYED_RELEASE_CAPSULE | Freq: Every day | ORAL | 0 refills | Status: DC | PRN

## 2022-01-25 NOTE — Assessment & Plan Note (Signed)
Recheck Vitamin D levels, currently on Vitamin D supplements 1000 IU daily.  ?

## 2022-01-25 NOTE — Progress Notes (Signed)
? ?Established Patient Office Visit ? ?Subjective   ?Patient ID: Regina Dunlap, female    DOB: 1960/12/26  Age: 61 y.o. MRN: [id number] ? ?Chief Complaint  ?Patient presents with  ? Follow-up  ? Hyperlipidemia  ? Hypertension  ? ? ?HPI ?Regina Dunlap is a 61 year old female here for follow up on chronic medical conditions. ? ?Hypertension/OSA: ?-Medications: Olmesartan-HCTZ 40-25 mg ?-Patient is compliant with above medications and reports no side effects. ?-Checking BP at home (average): doesn't check, no machine  ?-Denies any SOB, CP, vision changes, LE edema or symptoms of hypotension ?-Compliant with CPAP ? ?HLD: ?-Medications: Zocor 20 ?-Patient is compliant with above medications and reports no side effects.  ?-Last lipid panel: Lipid Panel  ?   ?Component Value Date/Time  ? CHOL 156 08/12/2021 1116  ? CHOL 181 10/13/2015 0911  ? TRIG 146 08/12/2021 1116  ? HDL 52 08/12/2021 1116  ? HDL 54 10/13/2015 0911  ? CHOLHDL 3.0 08/12/2021 1116  ? VLDL 38 (H) 04/24/2017 1528  ? [place] 79 08/12/2021 1116  ? LABVLDL 36 10/13/2015 0911  ? ? ?GERD: ?-Currently on Prilosec 20  ?-Symptoms include reflux, which is controlled with the medication. Denies abdominal pain, nausea, vomiting, changes in appetite.  ? ?Pre-Diabetes: ?-Last A1c 5/22 6.1% ? ?Vitamin D Deficiency ?-Last checked 5/22, low at 26 ?-Currently on Vitamin D supplements 1000 IU daily  ? ?Health Maintenance: ?-Blood work due ?-Mammogram 12/22 Birads-2 ?-Colonoscopy 11/21, repeat in 5 years ? ? ?Review of Systems  ?Constitutional:  Negative for chills and fever.  ?Eyes:  Negative for blurred vision.  ?Respiratory:  Negative for cough.   ?Cardiovascular:  Negative for chest pain and palpitations.  ?Gastrointestinal:  Negative for abdominal pain, heartburn, nausea and vomiting.  ?Musculoskeletal:  Negative for neck pain.  ?Neurological:  Negative for dizziness and headaches.  ? ?  ?Objective:  ?  ? ?BP 134/82   Pulse 68   Temp 97.8 ?F (36.6 ?C)   Resp 16    Ht '5\' 2"'  (1.575 m)   Wt 179 lb 9.6 oz (81.5 kg)   SpO2 99%   BMI 32.85 kg/m?  ?BP Readings from Last 3 Encounters:  ?01/25/22 134/82  ?12/20/21 120/78  ?11/29/21 124/72  ? ?Wt Readings from Last 3 Encounters:  ?01/25/22 179 lb 9.6 oz (81.5 kg)  ?12/20/21 182 lb (82.6 kg)  ?11/29/21 183 lb 6.4 oz (83.2 kg)  ? ?  ? ?Physical Exam ?Constitutional:   ?   Appearance: Normal appearance.  ?HENT:  ?   Head: Normocephalic and atraumatic.  ?Eyes:  ?   Conjunctiva/sclera: Conjunctivae normal.  ?Cardiovascular:  ?   Rate and Rhythm: Normal rate and regular rhythm.  ?Pulmonary:  ?   Effort: Pulmonary effort is normal.  ?   Breath sounds: Normal breath sounds.  ?Musculoskeletal:  ?   Right lower leg: No edema.  ?   Left lower leg: No edema.  ?Skin: ?   General: Skin is warm and dry.  ?Neurological:  ?   General: No focal deficit present.  ?   Mental Status: She is alert. Mental status is at baseline.  ?Psychiatric:     ?   Mood and Affect: Mood normal.     ?   Behavior: Behavior normal.  ? ? ? ?No results found for any visits on 01/25/22. ? ?Last CBC ?Lab Results  ?Component Value Date  ? WBC 4.4 12/20/2021  ? HGB 12.8 12/20/2021  ? HCT 38.0 12/20/2021  ?  MCV 85 12/20/2021  ? MCH 28.6 12/20/2021  ? RDW 12.5 12/20/2021  ? PLT 207 12/20/2021  ? ?Last metabolic panel ?Lab Results  ?Component Value Date  ? GLUCOSE 117 (H) 12/20/2021  ? NA 144 12/20/2021  ? K 3.9 12/20/2021  ? CL 108 (H) 12/20/2021  ? CO2 24 12/20/2021  ? BUN 10 12/20/2021  ? CREATININE 0.82 12/20/2021  ? EGFR 83 12/20/2021  ? CALCIUM 9.1 12/20/2021  ? PROT 6.9 08/12/2021  ? ALBUMIN 4.3 04/24/2017  ? LABGLOB 2.5 10/13/2015  ? AGRATIO 1.7 10/13/2015  ? BILITOT 0.4 08/12/2021  ? ALKPHOS 76 04/24/2017  ? AST 20 08/12/2021  ? ALT 20 08/12/2021  ? ?Last lipids ?Lab Results  ?Component Value Date  ? CHOL 156 08/12/2021  ? HDL 52 08/12/2021  ? [place] 79 08/12/2021  ? TRIG 146 08/12/2021  ? CHOLHDL 3.0 08/12/2021  ? ?Last hemoglobin A1c ?Lab Results  ?Component Value  Date  ? HGBA1C 6.1 (H) 02/09/2021  ? ?Last thyroid functions ?Lab Results  ?Component Value Date  ? TSH 2.03 10/15/2019  ? ?Last vitamin D ?Lab Results  ?Component Value Date  ? VD25OH 26 (L) 02/09/2021  ? ?Last vitamin B12 and Folate ?No results found for: VITAMINB12, FOLATE ?  ? ?The 10-year ASCVD risk score (Arnett DK, et al., 2019) is: 3.3% ? ?  ?Assessment & Plan:  ? ?Problem List Items Addressed This Visit   ? ?  ? Cardiovascular and Mediastinum  ? Essential hypertension  ?  Stable, BP at goal. Continue current regimen of Olmesartan-HCTZ 40-25 mg daily.  ? ?  ?  ?  ? Respiratory  ? OSA on CPAP  ?  Stable, doing well with CPAP. ? ?  ?  ?  ? Digestive  ? GERD without esophagitis  ?  Stable, symptoms controlled with medication, refill Prilosec 20 mg today. ? ?  ?  ? Relevant Medications  ? omeprazole (PRILOSEC) 20 MG capsule  ?  ? Other  ? Hypercholesterolemia with hypertriglyceridemia  ?  Reviewed last lipid panel with the patient, doing well on Zocor 20 mg.  ? ?  ?  ? Prediabetes - Primary  ?  Last A1c 6.1%, recheck today. Discussed starting Metformin 500 mg once daily to prevent progression, will discuss again after getting labs. Continue diet modification and lifestyle management.  ? ?  ?  ? Relevant Orders  ? HgB A1c  ? Vitamin D deficiency  ?  Recheck Vitamin D levels, currently on Vitamin D supplements 1000 IU daily.  ? ?  ?  ? Relevant Orders  ? Vitamin D (25 hydroxy)  ? ? ?Return in about 6 months (around 07/28/2022).  ? ? ?Teodora Medici, DO ? ?

## 2022-01-25 NOTE — Assessment & Plan Note (Signed)
Stable, BP at goal. Continue current regimen of Olmesartan-HCTZ 40-25 mg daily.  ?

## 2022-01-25 NOTE — Assessment & Plan Note (Signed)
Stable, symptoms controlled with medication, refill Prilosec 20 mg today. ?

## 2022-01-25 NOTE — Assessment & Plan Note (Signed)
Last A1c 6.1%, recheck today. Discussed starting Metformin 500 mg once daily to prevent progression, will discuss again after getting labs. Continue diet modification and lifestyle management.  ?

## 2022-01-25 NOTE — Patient Instructions (Addendum)
It was great seeing you today! ? ?Plan discussed at today's visit: ?-Blood work ordered today, results will be uploaded to [place].  ?-Refills sent to pharmacy  ? ?Follow up in: 6 months  ? ?Take care and let us know if you have any questions or concerns prior to your next visit. ? ?Dr. Rosana Berger ? ?

## 2022-01-25 NOTE — Assessment & Plan Note (Signed)
Reviewed last lipid panel with the patient, doing well on Zocor 20 mg.  ?

## 2022-01-25 NOTE — Assessment & Plan Note (Signed)
Stable, doing well with CPAP. 

## 2022-01-26 ENCOUNTER — Encounter (HOSPITAL_COMMUNITY): Payer: BC Managed Care – PPO

## 2022-01-26 LAB — HEMOGLOBIN A1C
Hgb A1c MFr Bld: 6 % of total Hgb — ABNORMAL HIGH (ref <5.7)
Mean Plasma Glucose: 126 mg/dL
eAG (mmol/L): 7 mmol/L

## 2022-01-26 LAB — VITAMIN D 25 HYDROXY (VIT D DEFICIENCY, FRACTURES): Vit D, 25-Hydroxy: 24 ng/mL — ABNORMAL LOW (ref 30–100)

## 2022-01-26 MED ORDER — VITAMIN D (ERGOCALCIFEROL) 1.25 MG (50000 UNIT) PO CAPS: 50000.0000 [IU] | ORAL_CAPSULE | ORAL | 0 refills | Status: DC

## 2022-01-26 NOTE — Addendum Note (Signed)
Addended by: Teodora Medici on: 01/26/2022 08:22 AM ? ? Modules accepted: Orders ? ?

## 2022-01-27 ENCOUNTER — Encounter: Payer: Self-pay | Admitting: Internal Medicine

## 2022-01-27 DIAGNOSIS — R7303 Prediabetes: Secondary | ICD-10-CM

## 2022-01-27 MED ORDER — METFORMIN HCL 500 MG PO TABS: 500.0000 mg | ORAL_TABLET | Freq: Every day | ORAL | 1 refills | Status: DC

## 2022-01-27 NOTE — Progress Notes (Signed)
Patient agreeable to Metformin 500 mg once daily for prediabetes. Medication sent.  ?

## 2022-02-04 ENCOUNTER — Ambulatory Visit: Payer: BC Managed Care – PPO | Admitting: Medical

## 2022-02-04 ENCOUNTER — Ambulatory Visit (HOSPITAL_COMMUNITY): Payer: BC Managed Care – PPO | Attending: Medical

## 2022-02-04 DIAGNOSIS — R0609 Other forms of dyspnea: Secondary | ICD-10-CM

## 2022-02-04 DIAGNOSIS — R0600 Dyspnea, unspecified: Secondary | ICD-10-CM | POA: Diagnosis not present

## 2022-02-07 ENCOUNTER — Encounter: Payer: Self-pay | Admitting: Medical

## 2022-02-07 ENCOUNTER — Ambulatory Visit (INDEPENDENT_AMBULATORY_CARE_PROVIDER_SITE_OTHER): Payer: BC Managed Care – PPO | Admitting: Medical

## 2022-02-07 VITALS — BP 110/74 | HR 70 | Ht 62.0 in | Wt 179.5 lb

## 2022-02-07 DIAGNOSIS — G4733 Obstructive sleep apnea (adult) (pediatric): Secondary | ICD-10-CM | POA: Diagnosis not present

## 2022-02-07 DIAGNOSIS — I10 Essential (primary) hypertension: Secondary | ICD-10-CM

## 2022-02-07 DIAGNOSIS — R0609 Other forms of dyspnea: Secondary | ICD-10-CM

## 2022-02-07 DIAGNOSIS — E782 Mixed hyperlipidemia: Secondary | ICD-10-CM | POA: Diagnosis not present

## 2022-02-07 NOTE — Patient Instructions (Signed)

## 2022-02-07 NOTE — Progress Notes (Signed)
?Cardiology Office Note:   ? ?Date:  02/07/2022  ? ?ID:  Regina Dunlap, DOB November 02, 1960, MRN [id number] ? ?PCP:  Teodora Medici, DO  ?[facility] Cardiologist:  Kathlyn Sacramento, MD  ?[facility] Electrophysiologist:  None  ? ?Referring MD: Teodora Medici, DO  ? ?Chief Complaint: 1-2 month follow-up ? ?History of Present Illness:   ? ?Regina Dunlap is a 61 y.o. female with a hx of  HTN, HLD, hepatic steatosis, obesity, sleep apnea on CPAP, GERD, exertional dyspnea who presents for overdue follow-up.  ?  ?Echo in 2015 showed normal EF. Treadmill test was normal. She underwent MPI for exertional dyspnea that showed no evidence of ischemia. Repeat echo 2021 showed normal LCSF, G1DD, and no significant valvular abnormalities.  ? ?Last seen 12/20/21 and reported exertional dyspnea. Labs and echo were ordered.  ? ?Today, the patient reports she is overall doing well. She is walking daily, up to 30 minutes daily. She walks every morning and is starting to use hands weights. She has cut out some meats from her diet. Trying to be healthier and lose some weight. She reports breathing has been better since she has been walking more. No chest pain, LLE, orthopnea, pnd. CPX and echo were reviewed in detail today.  ? ?Past Medical History:  ?Diagnosis Date  ? Esophageal reflux   ? Fatty liver 11/04/2016  ? Mastodynia   ? Other and unspecified hyperlipidemia   ? Overweight (BMI 25.0-29.9) 11/04/2016  ? Pain in thoracic spine   ? Prediabetes 03/28/2016  ? Unspecified essential hypertension   ? Unspecified sleep apnea   ? CPAP  ? ? ?Past Surgical History:  ?Procedure Laterality Date  ? BREAST BIOPSY Right 1999  ? neg  ? BREAST CYST ASPIRATION Right   ? neg  ? COLONOSCOPY WITH PROPOFOL N/A 08/07/2020  ? Procedure: COLONOSCOPY WITH PROPOFOL;  Surgeon: Lucilla Lame, MD;  Location: [facility];  Service: Endoscopy;  Laterality: N/A;  Sleep apnea ?priority 4  ? TUBAL LIGATION    ? ? ?Current  Medications: ?Current Meds  ?Medication Sig  ? calcium gluconate 500 MG tablet Take 1 tablet by mouth daily.  ? cholecalciferol (VITAMIN D3) 25 MCG (1000 UNIT) tablet Take 5,000 Units by mouth daily.  ? fluticasone (FLONASE) 50 MCG/ACT nasal spray Place 2 sprays into both nostrils daily.  ? Krill Oil 1000 MG CAPS Take 1 capsule daily by mouth.   ? Magnesium 100 MG CAPS Take 1,000 mg by mouth.  ? metFORMIN (GLUCOPHAGE) 500 MG tablet Take 1 tablet (500 mg total) by mouth daily with breakfast.  ? olmesartan-hydrochlorothiazide (BENICAR HCT) 40-25 MG tablet Take 1 tablet by mouth daily.  ? omeprazole (PRILOSEC) 20 MG capsule Take 1 capsule (20 mg total) by mouth daily as needed (GERD, reflux).  ? simvastatin (ZOCOR) 20 MG tablet Take 1 tablet (20 mg total) by mouth daily at 6 PM.  ? TURMERIC PO Take by mouth daily. Reported on 03/28/2016  ? Vitamin D, Ergocalciferol, (DRISDOL) 1.25 MG (50000 UNIT) CAPS capsule Take 1 capsule (50,000 Units total) by mouth every 7 (seven) days.  ? VITAMIN E PO Take by mouth as needed.  ?  ? ?Allergies:   Amoxil [amoxicillin], Ciprofloxacin, Lisinopril, Migraine formula [aspirin-acetaminophen-caffeine], and Povidone iodine  ? ?Social History  ? ?Socioeconomic History  ? Marital status: Married  ?  Spouse name: Not on file  ? Number of children: Not on file  ? Years of education: Not on file  ? Highest  education level: Not on file  ?Occupational History  ? Not on file  ?Tobacco Use  ? Smoking status: Never  ? Smokeless tobacco: Never  ?Vaping Use  ? Vaping Use: Never used  ?Substance and Sexual Activity  ? Alcohol use: Yes  ?  Comment: social  ? Drug use: No  ? Sexual activity: Yes  ?Other Topics Concern  ? Not on file  ?Social History Narrative  ? Not on file  ? ?Social Determinants of Health  ? ?Financial Resource Strain: Not on file  ?Food Insecurity: Not on file  ?Transportation Needs: Not on file  ?Physical Activity: Not on file  ?Stress: Not on file  ?Social Connections: Not on file  ?   ? ?Family History: ?The patient's family history includes Anuerysm in her brother; Cancer in her sister and sister; Hypertension in her father, sister, and sister; Migraines in her mother; Stroke in her father. There is no history of Breast cancer. ? ?ROS:   ?Please see the history of present illness.    ? All other systems reviewed and are negative. ? ?EKGs/Labs/Other Studies Reviewed:   ? ?The following studies were reviewed today: ? ?Cardiopulmonary exercise test 02/04/22 ?Interpretation  ? ?Notes: Patient gave a very good effort. Pulse-oximetry remained 95-98% for the duration of exercise (manually monitored). There were no sustained arrhythmias or ST-T changes. BP response appropriate.  ? ?ECG:  Resting ECG in normal sinus bradycardia. HR response appropriate. There were no sustained arrhythmias or ST-T changes. BP response mildly hypertensive at peak exercise.  ? ?PFT:  Pre-exercise spirometry was within normal limits. The MVV was normal.  ? ?CPX:  Exercise testing with gas exchange demonstrates a normal peak VO2 of 21.6 ml/kg/min (108% of the age/gender/weight matched sedentary norms). The RER of 1.06 indicates near maximal effort. When adjusted to the patient's ideal body weight of 131.3 lb (59.6 kg) the peak VO2 is 29.3 ml/kg (ibw)/min (119% of the ibw-adjusted predicted). The VE/VCO2 slope is normal. The oxygen uptake efficiency slope (OUES) is normal. The VO2 at the ventilatory threshold was normal at 72% of the predicted peak VO2. At peak exercise, the ventilation reached 60% of the measured MVV indicating ventilatory reserve remained. The O2pulse (a surrogate for stroke volume) increased with incremental exercise, reaching peak at 12 ml/beat (120% predicted).  ? ? ?Conclusion: Exercise testing with gas exchange demonstrates normal functional capacity when compared to matched sedentary norms. There is no indication for cardiopulmonary abnormality and no ST-T changes were noted during exercise. Patient  presents with limitations and exercise intolerance that appears related to body habitus once corrected for ideal body weight.  ? ? ?Test, report and preliminary impression by:  ?Kathy Breach, MS, ACSM-RCEP  ?02/04/2022 12:20 PM  ? ? ?Attending: Agree with above. Normal (to excellent) functional capacity based on CPX testing with no obvious cardiopulmonary abnormalities noted. Based on testing results, patients perceived exercise intolerance likely due primarily to her body habitus.  ? ?Glori Bickers, MD  ? ?Echo 12/2021 ? 1. Left ventricular ejection fraction, by estimation, is 60 to 65%. The  ?left ventricle has normal function. The left ventricle has no regional  ?wall motion abnormalities. Left ventricular diastolic parameters are  ?consistent with Grade I diastolic  ?dysfunction (impaired relaxation).  ? 2. Right ventricular systolic function is normal. The right ventricular  ?size is normal. Tricuspid regurgitation signal is inadequate for assessing  ?PA pressure.  ? 3. The mitral valve is normal in structure. Mild mitral valve  ?regurgitation.  No evidence of mitral stenosis.  ? 4. The aortic valve is normal in structure. Aortic valve regurgitation is  ?not visualized. No aortic stenosis is present.  ? 5. The inferior vena cava is normal in size with greater than 50%  ?respiratory variability, suggesting right atrial pressure of 3 mmHg.  ? ?10:38 PM  ? ? MPI 09/2019 ?Narrative & Impression  ?There was no ST segment deviation noted during stress. ?No T wave inversion was noted during stress. ?The study is normal. ?This is a low risk study. ?The left ventricular ejection fraction is normal (69%). ?No evidence for ischemia ?   ?This result has not been signed. Information might be incomplete.  ?  ?  ?Echo 11/2019 ? 1. Left ventricular ejection fraction, by estimation, is 60 to 65%. The  ?left ventricle has normal function. The left ventricle has no regional  ?wall motion abnormalities. Left ventricular  diastolic parameters are  ?consistent with Grade I diastolic  ?dysfunction (impaired relaxation).  ? 2. Right ventricular systolic function is normal. The right ventricular  ?size is normal.  ? ? ?EKG:  EKG is not ordered tod

## 2022-02-10 ENCOUNTER — Ambulatory Visit: Payer: BC Managed Care – PPO | Admitting: Internal Medicine

## 2022-02-14 ENCOUNTER — Ambulatory Visit: Payer: BC Managed Care – PPO | Admitting: Internal Medicine

## 2022-03-06 ENCOUNTER — Other Ambulatory Visit: Payer: Self-pay | Admitting: Unknown Physician Specialty

## 2022-03-07 NOTE — Telephone Encounter (Signed)
Requested Prescriptions  Pending Prescriptions Disp Refills  . fluticasone (FLONASE) 50 MCG/ACT nasal spray [Pharmacy Med Name: FLUTICASONE 50MCG NASAL SP (120) RX] 16 g 3    Sig: SHAKE LIQUID AND USE 2 SPRAYS IN EACH NOSTRIL DAILY     Ear, Nose, and Throat: Nasal Preparations - Corticosteroids Passed - 03/06/2022  7:51 AM      Passed - Valid encounter within last 12 months    Recent Outpatient Visits          1 month ago Prediabetes   [facility] Teodora Medici, DO   3 months ago Deltoid bursitis, left   [facility] Bo Merino, FNP   4 months ago Strain of neck muscle, initial encounter   [place], DO   5 months ago Viral upper respiratory tract infection   [place], FNP   6 months ago Acute bilateral low back pain with bilateral sciatica   [facility] Bo Merino, FNP      Future Appointments            In 1 month Ralene Bathe, MD [place]   In 4 months Teodora Medici, [facility], [place]   In 5 months Furth, Cadence H, PA-C [place], [facility]

## 2022-04-14 ENCOUNTER — Ambulatory Visit (INDEPENDENT_AMBULATORY_CARE_PROVIDER_SITE_OTHER): Payer: BC Managed Care – PPO | Admitting: Dermatology

## 2022-04-14 DIAGNOSIS — L650 Telogen effluvium: Secondary | ICD-10-CM | POA: Diagnosis not present

## 2022-04-14 DIAGNOSIS — L659 Nonscarring hair loss, unspecified: Secondary | ICD-10-CM

## 2022-04-14 NOTE — Patient Instructions (Signed)
Due to recent changes in healthcare laws, you may see results of your pathology and/or laboratory studies on MyChart before the doctors have had a chance to review them. We understand that in some cases there may be results that are confusing or concerning to you. Please understand that not all results are received at the same time and often the doctors may need to interpret multiple results in order to provide you with the best plan of care or course of treatment. Therefore, we ask that you please give us 2 business days to thoroughly review all your results before contacting the office for clarification. Should we see a critical lab result, you will be contacted sooner.   If You Need Anything After Your Visit  If you have any questions or concerns for your doctor, please call our main line at [phone] and press option 4 to reach your doctor's medical assistant. If no one answers, please leave a voicemail as directed and we will return your call as soon as possible. Messages left after 4 pm will be answered the following business day.   You may also send us a message via MyChart. We typically respond to MyChart messages within 1-2 business days.  For prescription refills, please ask your pharmacy to contact our office. Our fax number is [phone].  If you have an urgent issue when the clinic is closed that cannot wait until the next business day, you can page your doctor at the number below.    Please note that while we do our best to be available for urgent issues outside of office hours, we are not available 24/7.   If you have an urgent issue and are unable to reach us, you may choose to seek medical care at your doctor's office, retail clinic, urgent care center, or emergency room.  If you have a medical emergency, please immediately call 911 or go to the emergency department.  Pager Numbers  - Dr. Kowalski: [phone]  - Dr. Moye: [phone]  - Dr. Stewart:  [phone]  In the event of inclement weather, please call our main line at [phone] for an update on the status of any delays or closures.  Dermatology Medication Tips: Please keep the boxes that topical medications come in in order to help keep track of the instructions about where and how to use these. Pharmacies typically print the medication instructions only on the boxes and not directly on the medication tubes.   If your medication is too expensive, please contact our office at [phone] option 4 or send us a message through MyChart.   We are unable to tell what your co-pay for medications will be in advance as this is different depending on your insurance coverage. However, we may be able to find a substitute medication at lower cost or fill out paperwork to get insurance to cover a needed medication.   If a prior authorization is required to get your medication covered by your insurance company, please allow us 1-2 business days to complete this process.  Drug prices often vary depending on where the prescription is filled and some pharmacies may offer cheaper prices.  The website www.goodrx.com contains coupons for medications through different pharmacies. The prices here do not account for what the cost may be with help from insurance (it may be cheaper with your insurance), but the website can give you the price if you did not use any insurance.  - You can print the associated coupon and take it with   your prescription to the pharmacy.  - You may also stop by our office during regular business hours and pick up a GoodRx coupon card.  - If you need your prescription sent electronically to a different pharmacy, notify our office through [place] MyChart or by phone at [phone] option 4.     Si Usted Necesita Algo Despus de Su Visita  Tambin puede enviarnos un mensaje a travs de MyChart. Por lo general respondemos a los mensajes de MyChart en el transcurso de 1 a 2  das hbiles.  Para renovar recetas, por favor pida a su farmacia que se ponga en contacto con nuestra oficina. Nuestro nmero de fax es el [phone].  Si tiene un asunto urgente cuando la clnica est cerrada y que no puede esperar hasta el siguiente da hbil, puede llamar/localizar a su doctor(a) al nmero que aparece a continuacin.   Por favor, tenga en cuenta que aunque hacemos todo lo posible para estar disponibles para asuntos urgentes fuera del horario de oficina, no estamos disponibles las 24 horas del da, los 7 das de la semana.   Si tiene un problema urgente y no puede comunicarse con nosotros, puede optar por buscar atencin mdica  en el consultorio de su doctor(a), en una clnica privada, en un centro de atencin urgente o en una sala de emergencias.  Si tiene una emergencia mdica, por favor llame inmediatamente al 911 o vaya a la sala de emergencias.  Nmeros de bper  - Dr. Kowalski: [phone]  - Dra. Moye: [phone]  - Dra. Stewart: [phone]  En caso de inclemencias del tiempo, por favor llame a nuestra lnea principal al [phone] para una actualizacin sobre el estado de cualquier retraso o cierre.  Consejos para la medicacin en dermatologa: Por favor, guarde las cajas en las que vienen los medicamentos de uso tpico para ayudarle a seguir las instrucciones sobre dnde y cmo usarlos. Las farmacias generalmente imprimen las instrucciones del medicamento slo en las cajas y no directamente en los tubos del medicamento.   Si su medicamento es muy caro, por favor, pngase en contacto con nuestra oficina llamando al [phone] y presione la opcin 4 o envenos un mensaje a travs de MyChart.   No podemos decirle cul ser su copago por los medicamentos por adelantado ya que esto es diferente dependiendo de la cobertura de su seguro. Sin embargo, es posible que podamos encontrar un medicamento sustituto a menor costo o llenar un formulario para que el  seguro cubra el medicamento que se considera necesario.   Si se requiere una autorizacin previa para que su compaa de seguros cubra su medicamento, por favor permtanos de 1 a 2 das hbiles para completar este proceso.  Los precios de los medicamentos varan con frecuencia dependiendo del lugar de dnde se surte la receta y alguna farmacias pueden ofrecer precios ms baratos.  El sitio web www.goodrx.com tiene cupones para medicamentos de diferentes farmacias. Los precios aqu no tienen en cuenta lo que podra costar con la ayuda del seguro (puede ser ms barato con su seguro), pero el sitio web puede darle el precio si no utiliz ningn seguro.  - Puede imprimir el cupn correspondiente y llevarlo con su receta a la farmacia.  - Tambin puede pasar por nuestra oficina durante el horario de atencin regular y recoger una tarjeta de cupones de GoodRx.  - Si necesita que su receta se enve electrnicamente a una farmacia diferente, informe a nuestra oficina a travs de MyChart de [place]   o por telfono llamando al [phone] y presione la opcin 4.  

## 2022-04-14 NOTE — Progress Notes (Signed)
   New Patient Visit  Subjective  Regina Dunlap is a 61 y.o. female who presents for the following: Skin Problem (Check scalp, hair loss, hx of Alopecia in 1992 ). Pt had Covid 1 year ago, hair loss began before she was diagnosed with Covid.   The following portions of the chart were reviewed this encounter and updated as appropriate:   Tobacco  Allergies  Meds  Problems  Med Hx  Surg Hx  Fam Hx     Review of Systems:  No other skin or systemic complaints except as noted in HPI or Assessment and Plan.  Objective  Well appearing patient in no apparent distress; mood and affect are within normal limits.  A focused examination was performed including upper extremities, including the arms, hands, fingers, and fingernails and scalp. Relevant physical exam findings are noted in the Assessment and Plan.  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 It does not progress to baldness, and the hair eventually grows back on its own. It can be triggered by recent illness, recent surgery, thyroid disease, low iron stores, vitamin D deficiency, fad diets or rapid weight loss, hormonal changes such as pregnancy or birth control pills, and some medication. Usually the hair loss starts 2-3 months after the illness or health change. Rarely, it can continue for longer than a year.  Hx of Alopecia in 1992  BP 119/83  Labs reviewed from date 12/20/21 Normal CBC   Labs ordered TSH  Continue Biotin 2.5 mg daily   Pending normal labs plan on starting Minoxidil 1.25 mg daily   Related Procedures TSH  Return in about 4 months (around 08/15/2022) for Alopecia .  IMarye Round, CMA, am acting as scribe for Sarina Ser, MD .  Documentation: I have reviewed the above  documentation for accuracy and completeness, and I agree with the above.  Sarina Ser, MD

## 2022-04-15 LAB — TSH: TSH: 3.42 u[IU]/mL (ref 0.450–4.500)

## 2022-04-16 ENCOUNTER — Other Ambulatory Visit: Payer: Self-pay | Admitting: Internal Medicine

## 2022-04-16 DIAGNOSIS — E559 Vitamin D deficiency, unspecified: Secondary | ICD-10-CM

## 2022-04-18 ENCOUNTER — Telehealth: Payer: Self-pay

## 2022-04-18 ENCOUNTER — Other Ambulatory Visit: Payer: Self-pay | Admitting: Internal Medicine

## 2022-04-18 DIAGNOSIS — L659 Nonscarring hair loss, unspecified: Secondary | ICD-10-CM

## 2022-04-18 DIAGNOSIS — K219 Gastro-esophageal reflux disease without esophagitis: Secondary | ICD-10-CM

## 2022-04-18 MED ORDER — MINOXIDIL 2.5 MG PO TABS: ORAL_TABLET | ORAL | 1 refills | Status: DC

## 2022-04-18 NOTE — Telephone Encounter (Signed)
Requested medication (s) are due for refill today - no  Requested medication (s) are on the active medication list -yes  Future visit scheduled -yes  Last refill: 01/26/22 #12  Notes to clinic: High dose Vitamin D- sent for provider review   Requested Prescriptions  Pending Prescriptions Disp Refills   Vitamin D, Ergocalciferol, (DRISDOL) 1.25 MG (50000 UNIT) CAPS capsule [Pharmacy Med Name: VITAMIN D2 50,000IU (ERGO) CAP RX] 12 capsule 0    Sig: TAKE 1 CAPSULE BY MOUTH EVERY 7 DAYS     Endocrinology:  Vitamins - Vitamin D Supplementation 2 Failed - 04/16/2022  3:39 AM      Failed - Manual Review: Route requests for 50,000 IU strength to the provider      Failed - Vitamin D in normal range and within 360 days    Vit D, 25-Hydroxy  Date Value Ref Range Status  01/25/2022 24 (L) 30 - 100 ng/mL Final    Comment:    Vitamin D Status         25-OH Vitamin D: . Deficiency:                    <20 ng/mL Insufficiency:             20 - 29 ng/mL Optimal:                 > or = 30 ng/mL . For 25-OH Vitamin D testing on patients on  D2-supplementation and patients for whom quantitation  of D2 and D3 fractions is required, the QuestAssureD(TM) 25-OH VIT D, (D2,D3), LC/MS/MS is recommended: order  code [id number] (patients >62yr). See Note 1 . Note 1 . For additional information, please refer to  http://education.QuestDiagnostics.com/faq/FAQ199  (This link is being provided for informational/ educational purposes only.)          Passed - Ca in normal range and within 360 days    Calcium  Date Value Ref Range Status  12/20/2021 9.1 8.7 - 10.2 mg/dL Final         Passed - Valid encounter within last 12 months    Recent Outpatient Visits           2 months ago Prediabetes   [place]   4 months [facility]   5 months ago Strain of neck muscle, initial encounter   [place]   7 months ago Viral upper respiratory tract infection   [facility]   7 months ago Acute bilateral low back pain with bilateral sciatica   [facility]       Future Appointments             In 3 months [facility] [place]  In 3 months Furth, [employer] PA-C [place] [facility]   In 3 months KRalene Bathe MD [place]              Requested Prescriptions  Pending Prescriptions Disp Refills   Vitamin D, Ergocalciferol, (DRISDOL) 1.25 MG (50000 UNIT) CAPS capsule [Pharmacy Med Name: VITAMIN D2 50,000IU (ERGO) CAP RX] 12 capsule 0    Sig: TAKE 1 CAPSULE BY MOUTH EVERY 7 DAYS     Endocrinology:  Vitamins - Vitamin D Supplementation 2 Failed -  04/16/2022  3:39 AM      Failed - Manual Review: Route requests for 50,000 IU strength to the provider      Failed - Vitamin D in normal range and within 360 days    Vit D, 25-Hydroxy  Date Value Ref Range Status  01/25/2022 24 (L) 30 - 100 ng/mL Final    Comment:    Vitamin D Status         25-OH Vitamin D: . Deficiency:                    <20 ng/mL Insufficiency:             20 - 29 ng/mL Optimal:                 > or = 30 ng/mL . For 25-OH Vitamin D testing on patients on  D2-supplementation and patients for whom quantitation  of D2 and D3 fractions is required, the QuestAssureD(TM) 25-OH VIT D, (D2,D3), LC/MS/MS is recommended: order  code [id number] (patients >26yr). See Note 1 . Note 1 . For additional information, please refer to  http://education.QuestDiagnostics.com/faq/FAQ199  (This link is being provided for informational/ educational purposes only.)          Passed - Ca in normal range and within 360 days    Calcium  Date Value Ref Range Status  12/20/2021 9.1 8.7 - 10.2 mg/dL Final         Passed - Valid  encounter within last 12 months    Recent Outpatient Visits           2 months ago Prediabetes   [place]   4 months [facility]   5 months ago Strain of neck muscle, initial encounter   [facility]   7 months ago Viral upper respiratory tract infection   [place] F[facility]   7 months ago Acute bilateral low back pain with bilateral sciatica   [facility]       Future Appointments             In 3 months [facility]  In 3 months Furth, [employer] PA-C [place] [facility]   In 3 months KRalene Bathe MD [place]

## 2022-04-18 NOTE — Telephone Encounter (Signed)
Patient informed of lab results and prescription sent to pharmacy.

## 2022-04-18 NOTE — Telephone Encounter (Signed)
-----   Message from Ralene Bathe, MD sent at 04/15/2022  3:52 PM EDT ----- Lab from 04/14/2022 shows: Normal Thyroid / TSH test.  If pt would like to start Minoxidil 1.25 mg (Half of 2.5 mg pill) qd for hair loss disp #45 (3 mos supply  with 1 refill -- may send to Pharmacy.  Keep November 2023 appt

## 2022-04-19 NOTE — Telephone Encounter (Signed)
Requested Prescriptions  Pending Prescriptions Disp Refills  . omeprazole (PRILOSEC) 20 MG capsule [Pharmacy Med Name: OMEPRAZOLE '20MG'$  CAPSULES] 90 capsule 0    Sig: TAKE 1 CAPSULE(20 MG) BY MOUTH DAILY AS NEEDED FOR GERD OR REFLUX     Gastroenterology: Proton Pump Inhibitors Passed - 04/18/2022  3:38 AM      Passed - Valid encounter within last 12 months    Recent Outpatient Visits          2 months ago Prediabetes   [facility] Teodora Medici, DO   4 months ago Deltoid bursitis, left   [facility] Bo Merino, FNP   5 months ago Strain of neck muscle, initial encounter   [facility] Teodora Medici, DO   7 months ago Viral upper respiratory tract infection   [place], FNP   7 months ago Acute bilateral low back pain with bilateral sciatica   [facility] Bo Merino, FNP      Future Appointments            In 3 months Teodora Medici, DO [facility], [place]   In 3 months Furth, Cadence H, PA-C [place], [facility]   In 3 months Ralene Bathe, MD [place]

## 2022-04-23 ENCOUNTER — Encounter: Payer: Self-pay | Admitting: Dermatology

## 2022-05-20 ENCOUNTER — Ambulatory Visit: Payer: Self-pay | Admitting: *Deleted

## 2022-05-20 NOTE — Telephone Encounter (Signed)
  Chief Complaint: requesting appt today cough  Symptoms: tested negative for covid. C/o nonproductive cough, feels congested. Back pain and under breast pain coughing. Wheezing noted after mopping floor and wheezing when lying down . Hx sleep apnea  and pneumonia Frequency: Monday 05/16/22 Pertinent Negatives: Patient denies chest pain no difficulty breathing no fever Disposition: '[]'$ ED /'[x]'$ Urgent Care (no appt availability in office) / '[]'$ Appointment(In office/virtual)/ '[]'$  [facility]/ '[]'$ Home Care/ '[]'$ Refused Recommended Disposition /'[]'$ [facility]/ '[]'$  Follow-up with PCP Additional Notes:   No appt available . Recommended UC . Patient did not want to schedule appt for Monday at this time. Please advise . Patient would like a call back.    Reason for Disposition  Cough has been present for > 3 weeks    Less than 3 weeks  Answer Assessment - Initial Assessment Questions 1. ONSET: "When did the cough begin?"      Monday 05/16/22 2. SEVERITY: "How bad is the cough today?"      Non productive  3. SPUTUM: "Describe the color of your sputum" (none, dry cough; clear, white, yellow, green)     None  4. HEMOPTYSIS: "Are you coughing up any blood?" If so ask: "How much?" (flecks, streaks, tablespoons, etc.)     na 5. DIFFICULTY BREATHING: "Are you having difficulty breathing?" If Yes, ask: "How bad is it?" (e.g., mild, moderate, severe)    - MILD: No SOB at rest, mild SOB with walking, speaks normally in sentences, can lie down, no retractions, pulse < 100.    - MODERATE: SOB at rest, SOB with minimal exertion and prefers to sit, cannot lie down flat, speaks in phrases, mild retractions, audible wheezing, pulse 100-120.    - SEVERE: Very SOB at rest, speaks in single words, struggling to breathe, sitting hunched forward, retractions, pulse > 120      No SOB noted wheezing after mopping floor  6. FEVER: "Do you have a fever?" If Yes, ask: "What is your temperature, how was it  measured, and when did it start?"     na 7. CARDIAC HISTORY: "Do you have any history of heart disease?" (e.g., heart attack, congestive heart failure)      Na  8. LUNG HISTORY: "Do you have any history of lung disease?"  (e.g., pulmonary embolus, asthma, emphysema)     Hx pneumonia  a few years ago  65. PE RISK FACTORS: "Do you have a history of blood clots?" (or: recent major surgery, recent prolonged travel, bedridden)     Na  10. OTHER SYMPTOMS: "Do you have any other symptoms?" (e.g., runny nose, wheezing, chest pain)       Wheezing after activity and lying down  11. PREGNANCY: "Is there any chance you are pregnant?" "When was your last menstrual period?"       na 12. TRAVEL: "Have you traveled out of the country in the last month?" (e.g., travel history, exposures)       na  Protocols used: Cough - Acute Non-Productive-A-AH

## 2022-05-20 NOTE — Telephone Encounter (Signed)
Pt notified appts full for today she could schedule for next week, go to urgent care or try OTC meds

## 2022-07-04 ENCOUNTER — Other Ambulatory Visit: Payer: Self-pay | Admitting: Internal Medicine

## 2022-07-05 NOTE — Telephone Encounter (Signed)
Requested Prescriptions  Pending Prescriptions Disp Refills  . fluticasone (FLONASE) 50 MCG/ACT nasal spray [Pharmacy Med Name: FLUTICASONE 50MCG NASAL SP (120) RX] 16 g 3    Sig: SHAKE LIQUID AND USE 2 SPRAYS IN EACH NOSTRIL DAILY     Ear, Nose, and Throat: Nasal Preparations - Corticosteroids Passed - 07/04/2022  3:36 AM      Passed - Valid encounter within last 12 months    Recent Outpatient Visits          5 months ago Prediabetes   [facility] Teodora Medici, DO   7 months ago Deltoid bursitis, left   [facility] Bo Merino, FNP   8 months ago Strain of neck muscle, initial encounter   [facility] Teodora Medici, DO   9 months ago Viral upper respiratory tract infection   [place], FNP   10 months ago Acute bilateral low back pain with bilateral sciatica   [facility] Bo Merino, FNP      Future Appointments            In 3 weeks Teodora Medici, [facility], [place]   In 1 month Furth, Cadence H, PA-C [place]. [place]   In 1 month Nehemiah Massed Monia Sabal, MD [place]

## 2022-07-07 ENCOUNTER — Other Ambulatory Visit: Payer: Self-pay | Admitting: Internal Medicine

## 2022-07-07 DIAGNOSIS — R7303 Prediabetes: Secondary | ICD-10-CM

## 2022-07-07 NOTE — Telephone Encounter (Signed)
Requested Prescriptions  Pending Prescriptions Disp Refills  . metFORMIN (GLUCOPHAGE) 500 MG tablet [Pharmacy Med Name: METFORMIN 500MG TABLETS] 90 tablet 0    Sig: TAKE 1 TABLET(500 MG) BY MOUTH DAILY WITH BREAKFAST     Endocrinology:  Diabetes - Biguanides Failed - 07/07/2022  8:42 AM      Failed - B12 Level in normal range and within 720 days    No results found for: "VITAMINB12"       Failed - CBC within normal limits and completed in the last 12 months    WBC  Date Value Ref Range Status  12/20/2021 4.4 3.4 - 10.8 x10E3/uL Final  02/09/2021 6.2 3.8 - 10.8 Thousand/uL Final   RBC  Date Value Ref Range Status  12/20/2021 4.47 3.77 - 5.28 x10E6/uL Final  02/09/2021 4.45 3.80 - 5.10 Million/uL Final   Hemoglobin  Date Value Ref Range Status  12/20/2021 12.8 11.1 - 15.9 g/dL Final   Hematocrit  Date Value Ref Range Status  12/20/2021 38.0 34.0 - 46.6 % Final   MCHC  Date Value Ref Range Status  12/20/2021 33.7 31.5 - 35.7 g/dL Final  02/09/2021 32.6 32.0 - 36.0 g/dL Final   [facility]  Date Value Ref Range Status  12/20/2021 28.6 26.6 - 33.0 pg Final  02/09/2021 28.5 27.0 - 33.0 pg Final   MCV  Date Value Ref Range Status  12/20/2021 85 79 - 97 fL Final   No results found for: "PLTCOUNTKUC", "LABPLAT", "POCPLA" RDW  Date Value Ref Range Status  12/20/2021 12.5 11.7 - 15.4 % Final         Passed - Cr in normal range and within 360 days    Creat  Date Value Ref Range Status  08/12/2021 1.30 (H) 0.50 - 1.03 mg/dL Final   Creatinine, Ser  Date Value Ref Range Status  12/20/2021 0.82 0.57 - 1.00 mg/dL Final         Passed - HBA1C is between 0 and 7.9 and within 180 days    Hgb A1c MFr Bld  Date Value Ref Range Status  01/25/2022 6.0 (H) <5.7 % of total Hgb Final    Comment:    For someone without known diabetes, a hemoglobin  A1c value between 5.7% and 6.4% is consistent with prediabetes and should be confirmed with a  follow-up test. . For someone with known  diabetes, a value <7% indicates that their diabetes is well controlled. A1c targets should be individualized based on duration of diabetes, age, comorbid conditions, and other considerations. . This assay result is consistent with an increased risk of diabetes. . Currently, no consensus exists regarding use of hemoglobin A1c for diagnosis of diabetes for children. .          Passed - eGFR in normal range and within 360 days    GFR, Est African American  Date Value Ref Range Status  02/09/2021 103 > OR = 60 mL/min/1.52m Final   GFR, Est Non African American  Date Value Ref Range Status  02/09/2021 88 > OR = 60 mL/min/1.754mFinal   eGFR  Date Value Ref Range Status  12/20/2021 83 >59 mL/min/1.73 Final         Passed - Valid encounter within last 6 months    Recent Outpatient Visits          5 months ago Prediabetes   [facility]   7 months ago [facility]  [facility] F[facility]   8 months ago Strain of neck muscle, initial encounter   [facility] Teodora Medici, DO   9 months ago Viral upper respiratory tract infection   [place], FNP   10 months ago Acute bilateral low back pain with bilateral sciatica   [facility] Bo Merino, FNP      Future Appointments            In 3 weeks Teodora Medici, [facility], [place]   In 1 month Kathlen Mody, Cadence H, PA-C [place]. [place]   In 1 month Nehemiah Massed Monia Sabal, MD [place]

## 2022-07-21 ENCOUNTER — Other Ambulatory Visit: Payer: Self-pay | Admitting: Internal Medicine

## 2022-07-21 DIAGNOSIS — Z1231 Encounter for screening mammogram for malignant neoplasm of breast: Secondary | ICD-10-CM

## 2022-07-27 NOTE — Progress Notes (Unsigned)
Established Patient Office Visit  Subjective   Patient ID: Regina Dunlap, female    DOB: [birth year]  Age: 61 y.o. MRN: [id number]  No chief complaint on file.   HPI Regina Dunlap is a 61 year old female here for follow up on chronic medical conditions.  Hypertension/OSA: -Medications: Olmesartan-HCTZ 40-25 mg -Patient is compliant with above medications and reports no side effects. -Checking BP at home (average): doesn't check, no machine  -Denies any SOB, CP, vision changes, LE edema or symptoms of hypotension -Compliant with CPAP  HLD: -Medications: Zocor 20 mg -Patient is compliant with above medications and reports no side effects.  -Last lipid panel: Lipid Panel     Component Value Date/Time   CHOL 156 08/12/2021 1116   CHOL 181 10/13/2015 0911   TRIG 146 08/12/2021 1116   HDL 52 08/12/2021 1116   HDL 54 10/13/2015 0911   CHOLHDL 3.0 08/12/2021 1116   VLDL 38 (H) 04/24/2017 1528   LDLCALC 79 08/12/2021 1116   LABVLDL 36 10/13/2015 0911    GERD: -Currently on Prilosec 20  -Symptoms include reflux, which is controlled with the medication. Denies abdominal pain, nausea, vomiting, changes in appetite.   Pre-Diabetes: -Last A1c 5/23 6.0% -Started on metformin 500 mg once daily at last office visit  Vitamin D Deficiency -Last checked 5/22, low at 26 -Currently on Vitamin D supplements 1000 IU daily   Health Maintenance: -Blood work due -Mammogram 12/22 Birads-2 -Colonoscopy 11/21, repeat in 5 years   Review of Systems  Constitutional:  Negative for chills and fever.  Eyes:  Negative for blurred vision.  Respiratory:  Negative for cough.   Cardiovascular:  Negative for chest pain and palpitations.  Gastrointestinal:  Negative for abdominal pain, heartburn, nausea and vomiting.  Musculoskeletal:  Negative for neck pain.  Neurological:  Negative for dizziness and headaches.      Objective:     There were no vitals taken for this visit. BP  Readings from Last 3 Encounters:  02/07/22 110/74  01/25/22 134/82  12/20/21 120/78   Wt Readings from Last 3 Encounters:  02/07/22 179 lb 8 oz (81.4 kg)  01/25/22 179 lb 9.6 oz (81.5 kg)  12/20/21 182 lb (82.6 kg)      Physical Exam Constitutional:      Appearance: Normal appearance.  HENT:     Head: Normocephalic and atraumatic.  Eyes:     Conjunctiva/sclera: Conjunctivae normal.  Cardiovascular:     Rate and Rhythm: Normal rate and regular rhythm.  Pulmonary:     Effort: Pulmonary effort is normal.     Breath sounds: Normal breath sounds.  Musculoskeletal:     Right lower leg: No edema.     Left lower leg: No edema.  Skin:    General: Skin is warm and dry.  Neurological:     General: No focal deficit present.     Mental Status: She is alert. Mental status is at baseline.  Psychiatric:        Mood and Affect: Mood normal.        Behavior: Behavior normal.      No results found for any visits on 07/28/22.  Last CBC Lab Results  Component Value Date   WBC 4.4 12/20/2021   HGB 12.8 12/20/2021   HCT 38.0 12/20/2021   MCV 85 12/20/2021   MCH 28.6 12/20/2021   RDW 12.5 12/20/2021   PLT 207 23/53/6144   Last metabolic panel Lab Results  Component Value Date  GLUCOSE 117 (H) 12/20/2021   NA 144 12/20/2021   K 3.9 12/20/2021   CL 108 (H) 12/20/2021   CO2 24 12/20/2021   BUN 10 12/20/2021   CREATININE 0.82 12/20/2021   EGFR 83 12/20/2021   CALCIUM 9.1 12/20/2021   PROT 6.9 08/12/2021   ALBUMIN 4.3 04/24/2017   LABGLOB 2.5 10/13/2015   AGRATIO 1.7 10/13/2015   BILITOT 0.4 08/12/2021   ALKPHOS 76 04/24/2017   AST 20 08/12/2021   ALT 20 08/12/2021   Last lipids Lab Results  Component Value Date   CHOL 156 08/12/2021   HDL 52 08/12/2021   LDLCALC 79 08/12/2021   TRIG 146 08/12/2021   CHOLHDL 3.0 08/12/2021   Last hemoglobin A1c Lab Results  Component Value Date   HGBA1C 6.0 (H) 01/25/2022   Last thyroid functions Lab Results  Component  Value Date   TSH 3.420 04/14/2022   Last vitamin D Lab Results  Component Value Date   VD25OH 24 (L) 01/25/2022   Last vitamin B12 and Folate No results found for: "VITAMINB12", "FOLATE"    The 10-year ASCVD risk score (Arnett DK, et al., 2019) is: 2.5%    Assessment & Plan:   Problem List Items Addressed This Visit   None  No follow-ups on file.    Regina Medici, DO

## 2022-07-28 ENCOUNTER — Encounter: Payer: Self-pay | Admitting: Internal Medicine

## 2022-07-28 ENCOUNTER — Ambulatory Visit (INDEPENDENT_AMBULATORY_CARE_PROVIDER_SITE_OTHER): Payer: BC Managed Care – PPO | Admitting: Internal Medicine

## 2022-07-28 VITALS — BP 126/78 | HR 79 | Temp 98.0°F | Resp 18 | Ht 62.0 in | Wt 172.1 lb

## 2022-07-28 DIAGNOSIS — K219 Gastro-esophageal reflux disease without esophagitis: Secondary | ICD-10-CM | POA: Diagnosis not present

## 2022-07-28 DIAGNOSIS — Z23 Encounter for immunization: Secondary | ICD-10-CM

## 2022-07-28 DIAGNOSIS — E559 Vitamin D deficiency, unspecified: Secondary | ICD-10-CM | POA: Diagnosis not present

## 2022-07-28 DIAGNOSIS — I10 Essential (primary) hypertension: Secondary | ICD-10-CM

## 2022-07-28 DIAGNOSIS — N6321 Unspecified lump in the left breast, upper outer quadrant: Secondary | ICD-10-CM

## 2022-07-28 DIAGNOSIS — R7303 Prediabetes: Secondary | ICD-10-CM | POA: Diagnosis not present

## 2022-07-28 DIAGNOSIS — Z1231 Encounter for screening mammogram for malignant neoplasm of breast: Secondary | ICD-10-CM

## 2022-07-28 DIAGNOSIS — E782 Mixed hyperlipidemia: Secondary | ICD-10-CM | POA: Diagnosis not present

## 2022-07-28 MED ORDER — OLMESARTAN MEDOXOMIL-HCTZ 40-25 MG PO TABS: 1.0000 | ORAL_TABLET | Freq: Every day | ORAL | 3 refills | Status: DC

## 2022-07-28 MED ORDER — METFORMIN HCL 500 MG PO TABS: 500.0000 mg | ORAL_TABLET | Freq: Every day | ORAL | 1 refills | Status: DC

## 2022-07-28 MED ORDER — OMEPRAZOLE 20 MG PO CPDR: 20.0000 mg | DELAYED_RELEASE_CAPSULE | Freq: Every day | ORAL | 1 refills | Status: DC

## 2022-07-28 MED ORDER — SIMVASTATIN 20 MG PO TABS: 20.0000 mg | ORAL_TABLET | Freq: Every day | ORAL | 3 refills | Status: DC

## 2022-07-28 NOTE — Patient Instructions (Addendum)
It was great seeing you today!  Plan discussed at today's visit: -Blood work ordered today, results will be uploaded to [place].  -Medications refilled -Mammogram and breast ultrasounds ordered  -Flu vaccine today  Follow up in: 6 months   Take care and let us know if you have any questions or concerns prior to your next visit.  Dr. Rosana Berger

## 2022-07-29 LAB — CBC WITH DIFFERENTIAL/PLATELET
Basophils Absolute: 0.1 10*3/uL (ref 0.0–0.2)
Basos: 1 %
EOS (ABSOLUTE): 0.2 10*3/uL (ref 0.0–0.4)
Eos: 5 %
Hematocrit: 40.3 % (ref 34.0–46.6)
Hemoglobin: 13.1 g/dL (ref 11.1–15.9)
Immature Grans (Abs): 0 10*3/uL (ref 0.0–0.1)
Immature Granulocytes: 0 %
Lymphocytes Absolute: 1.9 10*3/uL (ref 0.7–3.1)
Lymphs: 45 %
MCH: 28.7 pg (ref 26.6–33.0)
MCHC: 32.5 g/dL (ref 31.5–35.7)
MCV: 88 fL (ref 79–97)
Monocytes Absolute: 0.2 10*3/uL (ref 0.1–0.9)
Monocytes: 5 %
Neutrophils Absolute: 1.8 10*3/uL (ref 1.4–7.0)
Neutrophils: 44 %
Platelets: 272 10*3/uL (ref 150–450)
RBC: 4.56 x10E6/uL (ref 3.77–5.28)
RDW: 13.1 % (ref 11.7–15.4)
WBC: 4.2 10*3/uL (ref 3.4–10.8)

## 2022-07-29 LAB — COMPREHENSIVE METABOLIC PANEL
ALT: 18 IU/L (ref 0–32)
AST: 22 IU/L (ref 0–40)
Albumin/Globulin Ratio: 1.9 (ref 1.2–2.2)
Albumin: 4.7 g/dL (ref 3.8–4.9)
Alkaline Phosphatase: 104 IU/L (ref 44–121)
BUN/Creatinine Ratio: 15 (ref 9–23)
BUN: 13 mg/dL (ref 6–24)
Bilirubin Total: 0.3 mg/dL (ref 0.0–1.2)
CO2: 24 mmol/L (ref 20–29)
Calcium: 9.4 mg/dL (ref 8.7–10.2)
Chloride: 101 mmol/L (ref 96–106)
Creatinine, Ser: 0.88 mg/dL (ref 0.57–1.00)
Globulin, Total: 2.5 g/dL (ref 1.5–4.5)
Glucose: 98 mg/dL (ref 70–99)
Potassium: 3.9 mmol/L (ref 3.5–5.2)
Sodium: 143 mmol/L (ref 134–144)
Total Protein: 7.2 g/dL (ref 6.0–8.5)
eGFR: 76 mL/min/{1.73_m2} (ref >59)

## 2022-07-29 LAB — LIPID PANEL
Chol/HDL Ratio: 3.5 ratio (ref 0.0–4.4)
Cholesterol, Total: 180 mg/dL (ref 100–199)
HDL: 51 mg/dL (ref >39)
LDL Chol Calc (NIH): 98 mg/dL (ref 0–99)
Triglycerides: 179 mg/dL — ABNORMAL HIGH (ref 0–149)
VLDL Cholesterol Cal: 31 mg/dL (ref 5–40)

## 2022-07-29 LAB — VITAMIN D 25 HYDROXY (VIT D DEFICIENCY, FRACTURES): Vit D, 25-Hydroxy: 31 ng/mL (ref 30.0–100.0)

## 2022-07-29 LAB — HEMOGLOBIN A1C
Est. average glucose Bld gHb Est-mCnc: 131 mg/dL
Hgb A1c MFr Bld: 6.2 % — ABNORMAL HIGH (ref 4.8–5.6)

## 2022-08-11 ENCOUNTER — Ambulatory Visit
Admission: RE | Admit: 2022-08-11 | Discharge: 2022-08-11 | Disposition: A | Discharge status: Home / Self Care | Payer: BC Managed Care – PPO | Source: Ambulatory Visit | Attending: Internal Medicine | Admitting: Internal Medicine

## 2022-08-11 ENCOUNTER — Ambulatory Visit: Payer: BC Managed Care – PPO | Attending: Medical | Admitting: Medical

## 2022-08-11 ENCOUNTER — Encounter: Payer: Self-pay | Admitting: Medical

## 2022-08-11 VITALS — BP 112/76 | HR 66 | Ht 62.0 in | Wt 174.2 lb

## 2022-08-11 DIAGNOSIS — I10 Essential (primary) hypertension: Secondary | ICD-10-CM

## 2022-08-11 DIAGNOSIS — E782 Mixed hyperlipidemia: Secondary | ICD-10-CM | POA: Diagnosis not present

## 2022-08-11 DIAGNOSIS — N6322 Unspecified lump in the left breast, upper inner quadrant: Secondary | ICD-10-CM | POA: Diagnosis not present

## 2022-08-11 DIAGNOSIS — Z1231 Encounter for screening mammogram for malignant neoplasm of breast: Secondary | ICD-10-CM | POA: Diagnosis not present

## 2022-08-11 DIAGNOSIS — G4733 Obstructive sleep apnea (adult) (pediatric): Secondary | ICD-10-CM

## 2022-08-11 DIAGNOSIS — R92323 Mammographic fibroglandular density, bilateral breasts: Secondary | ICD-10-CM | POA: Diagnosis not present

## 2022-08-11 DIAGNOSIS — N6321 Unspecified lump in the left breast, upper outer quadrant: Secondary | ICD-10-CM

## 2022-08-11 DIAGNOSIS — R0609 Other forms of dyspnea: Secondary | ICD-10-CM | POA: Diagnosis not present

## 2022-08-11 NOTE — Patient Instructions (Signed)
Medication Instructions:  No changes at this time.   *If you need a refill on your cardiac medications before your next appointment, please call your pharmacy*   Lab Work: None  If you have labs (blood work) drawn today and your tests are completely normal, you will receive your results only by: [place] (if you have MyChart) OR A paper copy in the mail If you have any lab test that is abnormal or we need to change your treatment, we will call you to review the results.   Testing/Procedures: None   Follow-Up: At [facility], you and your health needs are our priority.  As part of our continuing mission to provide you with exceptional heart care, we have created designated Provider Care Teams.  These Care Teams include your primary Cardiologist (physician) and Advanced Practice Providers (APPs -  Physician Assistants and Nurse Practitioners) who all work together to provide you with the care you need, when you need it.   Your next appointment:   1 year(s)  The format for your next appointment:   In Person  Provider:   Kathlyn [place], MD or Cadence Kathlen Mody, [place]      Important Information About Sugar

## 2022-08-11 NOTE — Progress Notes (Signed)
Cardiology Office Note:    Date:  08/11/2022   ID:  Regina Dunlap, Regina Dunlap 10-Jul-1961, MRN [id number]  PCP:  Teodora Medici, DO  [facility] Cardiologist:  Kathlyn Sacramento, MD  [facility] Electrophysiologist:  None   Referring MD: Teodora Medici, DO   Chief Complaint: 6 month follow-up  History of Present Illness:    Regina Dunlap is a 61 y.o. female with a hx of HTN, HLD, hepatic steatosis, obesity, sleep apnea on CPAP, GERD, exertional dyspnea who presents for overdue follow-up.    Echo in 2015 showed normal EF. Treadmill test was normal. She underwent MPI for exertional dyspnea that showed no evidence of ischemia. Repeat echo 2021 showed normal LCSF, G1DD, and no significant valvular abnormalities.   Patient was seen 12/20/2021 reporting exertional dyspnea.  Echo, labs, CPX was ordered.  Echo showed normal LVEF with grade 1 diastolic dysfunction and mild valvular disease.  CPX showed normal functional capacity with no EKG changes, exercise limitations were from general deconditioning.  Patient was last seen 02/07/2022 and no changes were made.  Today, the patient report she is overall doing well. She denies chest pain, SOB, lower leg edema, orthopnea, or pnd. She is staying very active. She reports compliance with her CPAP.    Past Medical History:  Diagnosis Date   Esophageal reflux    Fatty liver 11/04/2016   Mastodynia    Other and unspecified hyperlipidemia    Overweight (BMI 25.0-29.9) 11/04/2016   Pain in thoracic spine    Prediabetes 03/28/2016   Unspecified essential hypertension    Unspecified sleep apnea    CPAP    Past Surgical History:  Procedure Laterality Date   BREAST BIOPSY Right 1999   neg   BREAST CYST ASPIRATION Right    neg   COLONOSCOPY WITH PROPOFOL N/A 08/07/2020   Procedure: COLONOSCOPY WITH PROPOFOL;  Surgeon: Lucilla Lame, MD;  Location: [facility];  Service: Endoscopy;  Laterality: N/A;  Sleep apnea priority 4    TUBAL LIGATION      Current Medications: Current Meds  Medication Sig   calcium gluconate 500 MG tablet Take 1 tablet by mouth daily.   cholecalciferol (VITAMIN D3) 25 MCG (1000 UNIT) tablet Take 5,000 Units by mouth daily.   fluticasone (FLONASE) 50 MCG/ACT nasal spray SHAKE LIQUID AND USE 2 SPRAYS IN EACH NOSTRIL DAILY   Krill Oil 1000 MG CAPS Take 1 capsule daily by mouth.    Magnesium 100 MG CAPS Take 1,000 mg by mouth.   metFORMIN (GLUCOPHAGE) 500 MG tablet Take 1 tablet (500 mg total) by mouth daily with breakfast.   minoxidil (LONITEN) 2.5 MG tablet Take 1/2 tab po QD.   olmesartan-hydrochlorothiazide (BENICAR HCT) 40-25 MG tablet Take 1 tablet by mouth daily.   omeprazole (PRILOSEC) 20 MG capsule Take 1 capsule (20 mg total) by mouth daily.   simvastatin (ZOCOR) 20 MG tablet Take 1 tablet (20 mg total) by mouth daily at 6 PM.   TURMERIC PO Take by mouth daily. Reported on 03/28/2016   Vitamin D, Ergocalciferol, (DRISDOL) 1.25 MG (50000 UNIT) CAPS capsule Take 1 capsule (50,000 Units total) by mouth every 7 (seven) days.     Allergies:   Amoxil [amoxicillin], Ciprofloxacin, Lisinopril, Shellfish allergy, Migraine formula [aspirin-acetaminophen-caffeine], and Povidone iodine   Social History   Socioeconomic History   Marital status: Married    Spouse name: Not on file   Number of children: Not on file   Years of education: Not on file  Highest education level: Not on file  Occupational History   Not on file  Tobacco Use   Smoking status: Never   Smokeless tobacco: Never  Vaping Use   Vaping Use: Never used  Substance and Sexual Activity   Alcohol use: Yes    Comment: social   Drug use: No   Sexual activity: Yes  Other Topics Concern   Not on file  Social History Narrative   Not on file   Social Determinants of Health   Financial Resource Strain: Low Risk  (06/19/2020)   Overall Financial Resource Strain (CARDIA)    Difficulty of Paying Living Expenses: Not  hard at all  Food Insecurity: No Food Insecurity (06/19/2020)   Hunger Vital Sign    Worried About Running Out of Food in the Last Year: Never true    Ran Out of Food in the Last Year: Never true  Transportation Needs: No Transportation Needs (06/19/2020)   PRAPARE - Hydrologist (Medical): No    Lack of Transportation (Non-Medical): No  Physical Activity: Insufficiently Active (06/19/2020)   Exercise Vital Sign    Days of Exercise per Week: 3 days    Minutes of Exercise per Session: 30 min  Stress: No Stress Concern Present (06/19/2020)   [place]    Feeling of Stress : Only a little  Social Connections: Moderately Isolated (06/19/2020)   Social Connection and Isolation Panel [NHANES]    Frequency of Communication with Friends and Family: More than three times a week    Frequency of Social Gatherings with Friends and Family: Never    Attends Religious Services: Never    Marine scientist or Organizations: No    Attends Music therapist: Never    Marital Status: Married     Family History: The patient's family history includes Anuerysm in her brother; Cancer in her sister and sister; Hypertension in her father, sister, and sister; Migraines in her mother; Stroke in her father. There is no history of Breast cancer.  ROS:   Please see the history of present illness.     All other systems reviewed and are negative.  EKGs/Labs/Other Studies Reviewed:    The following studies were reviewed today:   Cardiopulmonary exercise test 02/04/22 Interpretation   Notes: Patient gave a very good effort. Pulse-oximetry remained 95-98% for the duration of exercise (manually monitored). There were no sustained arrhythmias or ST-T changes. BP response appropriate.   ECG:  Resting ECG in normal sinus bradycardia. HR response appropriate. There were no sustained arrhythmias or ST-T  changes. BP response mildly hypertensive at peak exercise.   PFT:  Pre-exercise spirometry was within normal limits. The MVV was normal.   CPX:  Exercise testing with gas exchange demonstrates a normal peak VO2 of 21.6 ml/kg/min (108% of the age/gender/weight matched sedentary norms). The RER of 1.06 indicates near maximal effort. When adjusted to the patient's ideal body weight of 131.3 lb (59.6 kg) the peak VO2 is 29.3 ml/kg (ibw)/min (119% of the ibw-adjusted predicted). The VE/VCO2 slope is normal. The oxygen uptake efficiency slope (OUES) is normal. The VO2 at the ventilatory threshold was normal at 72% of the predicted peak VO2. At peak exercise, the ventilation reached 60% of the measured MVV indicating ventilatory reserve remained. The O2pulse (a surrogate for stroke volume) increased with incremental exercise, reaching peak at 12 ml/beat (120% predicted).    Conclusion: Exercise testing with gas exchange  demonstrates normal functional capacity when compared to matched sedentary norms. There is no indication for cardiopulmonary abnormality and no ST-T changes were noted during exercise. Patient presents with limitations and exercise intolerance that appears related to body habitus once corrected for ideal body weight.    Test, report and preliminary impression by:  Kathy Breach, MS, ACSM-RCEP  02/04/2022 12:20 PM    Attending: Agree with above. Normal (to excellent) functional capacity based on CPX testing with no obvious cardiopulmonary abnormalities noted. Based on testing results, patients perceived exercise intolerance likely due primarily to her body habitus.   Glori Bickers, MD    Echo 12/2021  1. Left ventricular ejection fraction, by estimation, is 60 to 65%. The  left ventricle has normal function. The left ventricle has no regional  wall motion abnormalities. Left ventricular diastolic parameters are  consistent with Grade I diastolic  dysfunction (impaired  relaxation).   2. Right ventricular systolic function is normal. The right ventricular  size is normal. Tricuspid regurgitation signal is inadequate for assessing  PA pressure.   3. The mitral valve is normal in structure. Mild mitral valve  regurgitation. No evidence of mitral stenosis.   4. The aortic valve is normal in structure. Aortic valve regurgitation is  not visualized. No aortic stenosis is present.   5. The inferior vena cava is normal in size with greater than 50%  respiratory variability, suggesting right atrial pressure of 3 mmHg.   10:38 PM     MPI 09/2019 Narrative & Impression  There was no ST segment deviation noted during stress. No T wave inversion was noted during stress. The study is normal. This is a low risk study. The left ventricular ejection fraction is normal (69%). No evidence for ischemia    This result has not been signed. Information might be incomplete.      Echo 11/2019  1. Left ventricular ejection fraction, by estimation, is 60 to 65%. The  left ventricle has normal function. The left ventricle has no regional  wall motion abnormalities. Left ventricular diastolic parameters are  consistent with Grade I diastolic  dysfunction (impaired relaxation).   2. Right ventricular systolic function is normal. The right ventricular  size is normal.     EKG:  EKG is ordered today.  The ekg ordered today demonstrates NSR, 66bpm, nonspecific T wave changes  Recent Labs: 04/14/2022: TSH 3.420 07/28/2022: ALT 18; BUN 13; Creatinine, Ser 0.88; Hemoglobin 13.1; Platelets 272; Potassium 3.9; Sodium 143  Recent Lipid Panel    Component Value Date/Time   CHOL 180 07/28/2022 1009   TRIG 179 (H) 07/28/2022 1009   HDL 51 07/28/2022 1009   CHOLHDL 3.5 07/28/2022 1009   CHOLHDL 3.0 08/12/2021 1116   VLDL 38 (H) 04/24/2017 1528   LDLCALC 98 07/28/2022 1009   LDLCALC 79 08/12/2021 1116    Physical Exam:    VS:  BP 112/76 (BP Location: Left Arm, Patient  Position: Sitting, Cuff Size: Normal)   Pulse 66   Ht '5\' 2"'$  (1.575 m)   Wt 174 lb 3.2 oz (79 kg)   SpO2 98%   BMI 31.86 kg/m     Wt Readings from Last 3 Encounters:  08/11/22 174 lb 3.2 oz (79 kg)  07/28/22 172 lb 1.6 oz (78.1 kg)  02/07/22 179 lb 8 oz (81.4 kg)     GEN:  Well nourished, well developed in no acute distress HEENT: Normal NECK: No JVD; No carotid bruits LYMPHATICS: No lymphadenopathy CARDIAC: RRR, no murmurs,  rubs, gallops RESPIRATORY:  Clear to auscultation without rales, wheezing or rhonchi  ABDOMEN: Soft, non-tender, non-distended MUSCULOSKELETAL:  No edema; No deformity  SKIN: Warm and dry NEUROLOGIC:  Alert and oriented x 3 PSYCHIATRIC:  Normal affect   ASSESSMENT:    1. DOE (dyspnea on exertion)   2. Essential hypertension   3. Hyperlipidemia, mixed   4. OSA (obstructive sleep apnea)    PLAN:    In order of problems listed above:  DOE Patient denies chest pain or SOB. She has been staying active with hiking and walking. Echo 12/2021 showed LVEF 60-65%, G1DD, mild MR. No further work-up.   HTN BP is good today. Continue Benicar.   HLD Recent LDL 98. Continue Simvastatin.  OSA She reports compliance with CPAP.   Disposition: Follow up in 1 year(s) with MD/APP    Signed, Albino Bufford Ninfa Meeker, PA-C  08/11/2022 2:43 PM    [facility]

## 2022-08-12 ENCOUNTER — Other Ambulatory Visit: Payer: Self-pay | Admitting: Internal Medicine

## 2022-08-12 DIAGNOSIS — N63 Unspecified lump in unspecified breast: Secondary | ICD-10-CM

## 2022-08-12 DIAGNOSIS — R928 Other abnormal and inconclusive findings on diagnostic imaging of breast: Secondary | ICD-10-CM

## 2022-08-15 ENCOUNTER — Ambulatory Visit: Payer: BC Managed Care – PPO | Admitting: Dermatology

## 2022-08-29 ENCOUNTER — Ambulatory Visit: Payer: BC Managed Care – PPO

## 2022-08-29 ENCOUNTER — Ambulatory Visit (INDEPENDENT_AMBULATORY_CARE_PROVIDER_SITE_OTHER): Payer: BC Managed Care – PPO | Admitting: Dermatology

## 2022-08-29 VITALS — BP 108/70 | HR 65

## 2022-08-29 DIAGNOSIS — Z79899 Other long term (current) drug therapy: Secondary | ICD-10-CM | POA: Diagnosis not present

## 2022-08-29 DIAGNOSIS — L650 Telogen effluvium: Secondary | ICD-10-CM | POA: Diagnosis not present

## 2022-08-29 MED ORDER — MINOXIDIL 2.5 MG PO TABS: 2.5000 mg | ORAL_TABLET | Freq: Every day | ORAL | 5 refills | Status: DC

## 2022-08-29 NOTE — Patient Instructions (Signed)
Spironolactone can cause increased urination and cause blood pressure to decrease. Please watch for signs of lightheadedness and be cautious when changing position. It can sometimes cause breast tenderness or an irregular period in premenopausal women. It can also increase potassium. The increase in potassium usually is not a concern unless you are taking other medicines that also increase potassium, so please be sure your doctor knows all of the other medications you are taking. This medication should not be taken by pregnant women.  This medicine should also not be taken together with sulfa drugs like Bactrim (trimethoprim/sulfamethexazole).      Due to recent changes in healthcare laws, you may see results of your pathology and/or laboratory studies on MyChart before the doctors have had a chance to review them. We understand that in some cases there may be results that are confusing or concerning to you. Please understand that not all results are received at the same time and often the doctors may need to interpret multiple results in order to provide you with the best plan of care or course of treatment. Therefore, we ask that you please give us 2 business days to thoroughly review all your results before contacting the office for clarification. Should we see a critical lab result, you will be contacted sooner.   If You Need Anything After Your Visit  If you have any questions or concerns for your doctor, please call our main line at [phone] and press option 4 to reach your doctor's medical assistant. If no one answers, please leave a voicemail as directed and we will return your call as soon as possible. Messages left after 4 pm will be answered the following business day.   You may also send us a message via MyChart. We typically respond to MyChart messages within 1-2 business days.  For prescription refills, please ask your pharmacy to contact our office. Our fax number is  [phone].  If you have an urgent issue when the clinic is closed that cannot wait until the next business day, you can page your doctor at the number below.    Please note that while we do our best to be available for urgent issues outside of office hours, we are not available 24/7.   If you have an urgent issue and are unable to reach us, you may choose to seek medical care at your doctor's office, retail clinic, urgent care center, or emergency room.  If you have a medical emergency, please immediately call 911 or go to the emergency department.  Pager Numbers  - Dr. Kowalski: [phone]  - Dr. Moye: [phone]  - Dr. Stewart: [phone]  In the event of inclement weather, please call our main line at [phone] for an update on the status of any delays or closures.  Dermatology Medication Tips: Please keep the boxes that topical medications come in in order to help keep track of the instructions about where and how to use these. Pharmacies typically print the medication instructions only on the boxes and not directly on the medication tubes.   If your medication is too expensive, please contact our office at [phone] option 4 or send us a message through MyChart.   We are unable to tell what your co-pay for medications will be in advance as this is different depending on your insurance coverage. However, we may be able to find a substitute medication at lower cost or fill out paperwork to get insurance to cover a needed medication.   If   a prior authorization is required to get your medication covered by your insurance company, please allow us 1-2 business days to complete this process.  Drug prices often vary depending on where the prescription is filled and some pharmacies may offer cheaper prices.  The website www.goodrx.com contains coupons for medications through different pharmacies. The prices here do not account for what the cost may be with help from  insurance (it may be cheaper with your insurance), but the website can give you the price if you did not use any insurance.  - You can print the associated coupon and take it with your prescription to the pharmacy.  - You may also stop by our office during regular business hours and pick up a GoodRx coupon card.  - If you need your prescription sent electronically to a different pharmacy, notify our office through [place] MyChart or by phone at [phone] option 4.     Si Usted Necesita Algo Despus de Su Visita  Tambin puede enviarnos un mensaje a travs de MyChart. Por lo general respondemos a los mensajes de MyChart en el transcurso de 1 a 2 das hbiles.  Para renovar recetas, por favor pida a su farmacia que se ponga en contacto con nuestra oficina. Nuestro nmero de fax es el [phone].  Si tiene un asunto urgente cuando la clnica est cerrada y que no puede esperar hasta el siguiente da hbil, puede llamar/localizar a su doctor(a) al nmero que aparece a continuacin.   Por favor, tenga en cuenta que aunque hacemos todo lo posible para estar disponibles para asuntos urgentes fuera del horario de oficina, no estamos disponibles las 24 horas del da, los 7 das de la semana.   Si tiene un problema urgente y no puede comunicarse con nosotros, puede optar por buscar atencin mdica  en el consultorio de su doctor(a), en una clnica privada, en un centro de atencin urgente o en una sala de emergencias.  Si tiene una emergencia mdica, por favor llame inmediatamente al 911 o vaya a la sala de emergencias.  Nmeros de bper  - Dr. Kowalski: [phone]  - Dra. Moye: [phone]  - Dra. Stewart: [phone]  En caso de inclemencias del tiempo, por favor llame a nuestra lnea principal al [phone] para una actualizacin sobre el estado de cualquier retraso o cierre.  Consejos para la medicacin en dermatologa: Por favor, guarde las cajas en las que vienen los  medicamentos de uso tpico para ayudarle a seguir las instrucciones sobre dnde y cmo usarlos. Las farmacias generalmente imprimen las instrucciones del medicamento slo en las cajas y no directamente en los tubos del medicamento.   Si su medicamento es muy caro, por favor, pngase en contacto con nuestra oficina llamando al [phone] y presione la opcin 4 o envenos un mensaje a travs de MyChart.   No podemos decirle cul ser su copago por los medicamentos por adelantado ya que esto es diferente dependiendo de la cobertura de su seguro. Sin embargo, es posible que podamos encontrar un medicamento sustituto a menor costo o llenar un formulario para que el seguro cubra el medicamento que se considera necesario.   Si se requiere una autorizacin previa para que su compaa de seguros cubra su medicamento, por favor permtanos de 1 a 2 das hbiles para completar este proceso.  Los precios de los medicamentos varan con frecuencia dependiendo del lugar de dnde se surte la receta y alguna farmacias pueden ofrecer precios ms baratos.  El sitio web www.goodrx.com tiene   cupones para medicamentos de diferentes farmacias. Los precios aqu no tienen en cuenta lo que podra costar con la ayuda del seguro (puede ser ms barato con su seguro), pero el sitio web puede darle el precio si no utiliz ningn seguro.  - Puede imprimir el cupn correspondiente y llevarlo con su receta a la farmacia.  - Tambin puede pasar por nuestra oficina durante el horario de atencin regular y recoger una tarjeta de cupones de GoodRx.  - Si necesita que su receta se enve electrnicamente a una farmacia diferente, informe a nuestra oficina a travs de MyChart de [place] o por telfono llamando al [phone] y presione la opcin 4.  

## 2022-08-29 NOTE — Progress Notes (Signed)
   Follow-Up Visit   Subjective  Regina Dunlap is a 61 y.o. female who presents for the following: Follow-up (Telogen Effluvium follow up - Minoxidil 2.5 mg 1/2 tab qd - has not noticed much growth but hair is not falling out as much).  The following portions of the chart were reviewed this encounter and updated as appropriate:   Tobacco  Allergies  Meds  Problems  Med Hx  Surg Hx  Fam Hx     Review of Systems:  No other skin or systemic complaints except as noted in HPI or Assessment and Plan.  Objective  Well appearing patient in no apparent distress; mood and affect are within normal limits.  A focused examination was performed including scalp. Relevant physical exam findings are noted in the Assessment and Plan.  Scalp Some improvement when compared to photos today. 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 Condition is symptomatic / bothersome to patient. Not to goal.  Telogen Effluvium Counseling Telogen effluvium is a benign, self-limited condition causing increased hair shedding usually for several months. It does not progress to baldness, and the hair eventually grows back on its own. It can be triggered by recent illness, recent surgery, thyroid disease, low iron stores, vitamin D deficiency, fad diets or rapid weight loss, hormonal changes such as pregnancy or birth control pills, and some medication. Usually the hair loss starts 2-3 months after the illness or health change. Rarely, it can continue for longer than a year. Treatments options may include oral or topical Minoxidil; Red Light scalp treatments; Biotin 2.5 mg daily and other options.  Increase Minoxidil 2.5 mg 1 po qd - may decrease back to 1/2 tablet  qd if she notices side effects  minoxidil (LONITEN) 2.5 MG tablet - Scalp Take 1 tablet (2.5 mg total)  by mouth daily.   Return in about 6 months (around 02/28/2023) for Follow up.  I, Ashok Cordia, CMA, am acting as scribe for Sarina Ser, MD . Documentation: I have reviewed the above documentation for accuracy and completeness, and I agree with the above.  Sarina Ser, MD

## 2022-09-01 ENCOUNTER — Ambulatory Visit
Admission: RE | Admit: 2022-09-01 | Discharge: 2022-09-01 | Disposition: A | Discharge status: Home / Self Care | Payer: BC Managed Care – PPO | Source: Ambulatory Visit | Attending: Internal Medicine | Admitting: Internal Medicine

## 2022-09-01 DIAGNOSIS — N6325 Unspecified lump in the left breast, overlapping quadrants: Secondary | ICD-10-CM | POA: Diagnosis not present

## 2022-09-01 DIAGNOSIS — R928 Other abnormal and inconclusive findings on diagnostic imaging of breast: Secondary | ICD-10-CM | POA: Insufficient documentation

## 2022-09-01 DIAGNOSIS — N63 Unspecified lump in unspecified breast: Secondary | ICD-10-CM

## 2022-09-01 DIAGNOSIS — D249 Benign neoplasm of unspecified breast: Secondary | ICD-10-CM | POA: Diagnosis not present

## 2022-09-01 HISTORY — PX: BREAST BIOPSY: SHX20

## 2022-09-01 MED ORDER — LIDOCAINE HCL (PF) 1 % IJ SOLN
5.0000 mL | Freq: Once | INTRAMUSCULAR | Status: AC
  Administered 2022-09-01: 5 mL via INTRADERMAL

## 2022-09-01 MED ORDER — LIDOCAINE-EPINEPHRINE 1 %-1:100000 IJ SOLN
10.0000 mL | Freq: Once | INTRAMUSCULAR | Status: AC
  Administered 2022-09-01: 10 mL via INTRADERMAL

## 2022-09-02 LAB — SURGICAL PATHOLOGY

## 2022-09-10 ENCOUNTER — Encounter: Payer: Self-pay | Admitting: Dermatology

## 2022-10-13 ENCOUNTER — Encounter: Payer: Self-pay | Admitting: Internal Medicine

## 2022-10-13 ENCOUNTER — Ambulatory Visit (INDEPENDENT_AMBULATORY_CARE_PROVIDER_SITE_OTHER): Payer: BC Managed Care – PPO | Admitting: Internal Medicine

## 2022-10-13 VITALS — BP 104/70 | HR 98 | Temp 98.1°F | Resp 16 | Ht 62.0 in | Wt 175.1 lb

## 2022-10-13 DIAGNOSIS — R102 Pelvic and perineal pain: Secondary | ICD-10-CM | POA: Diagnosis not present

## 2022-10-13 DIAGNOSIS — N939 Abnormal uterine and vaginal bleeding, unspecified: Secondary | ICD-10-CM

## 2022-10-13 DIAGNOSIS — R140 Abdominal distension (gaseous): Secondary | ICD-10-CM

## 2022-10-13 LAB — POCT URINALYSIS DIPSTICK
Bilirubin, UA: NEGATIVE
Blood, UA: NEGATIVE
Glucose, UA: NEGATIVE
Ketones, UA: NEGATIVE
Leukocytes, UA: NEGATIVE
Nitrite, UA: NEGATIVE
Protein, UA: NEGATIVE
Spec Grav, UA: 1.01 (ref 1.010–1.025)
Urobilinogen, UA: 0.2 E.U./dL
pH, UA: 6 (ref 5.0–8.0)

## 2022-10-13 MED ORDER — METFORMIN HCL ER 500 MG PO TB24: 500.0000 mg | ORAL_TABLET | Freq: Every day | ORAL | 1 refills | Status: DC

## 2022-10-13 NOTE — Progress Notes (Signed)
Acute Office Visit  Subjective:     Patient ID: Regina Dunlap, female    DOB: [birth year], 62 y.o.   MRN: [id number]  Chief Complaint  Patient presents with   Abdominal Pain    Hypogastric area, radiate let or right side. On/off for about a month   Vaginal Bleeding    Pt noticed today after intercourse while she wiped some blood showed     HPI Patient is in today for abdominal pain.   Abdominal Pain: -Duration: 1 month  -Onset: gradual -Severity: moderate -Quality: cramping -Location:  epigastric  Episode duration:  -Radiation: yes to the sides  -Frequency: intermittent -Alleviating factors: Eating helps slightly, Naproxen helped  -Aggravating factors: uncertain -Status: stable -Fever: no -Nausea: no -Vomiting: no -Weight loss: no -Decreased appetite: no -Diarrhea: no -Constipation: no -Blood in stool: no -Heartburn: no -Dysuria/urinary frequency: no -Hematuria: no -History of sexually transmitted disease: no -LMP: years ago, last Pap 2021 normal  -Recurrent NSAID use: no  Was having intercourse this morning and noticed vaginal bleeding. She went through menopause many years ago and has not had any issue with bleeding until now. Last Pap 05/2020, normal.   Review of Systems  Constitutional:  Negative for chills and fever.  Respiratory:  Negative for shortness of breath.   Cardiovascular:  Negative for chest pain.  Gastrointestinal:  Positive for abdominal pain. Negative for blood in stool, constipation, diarrhea, heartburn, melena, nausea and vomiting.  Genitourinary:  Negative for dysuria, frequency, hematuria and urgency.        Objective:    BP 104/70   Pulse 98   Temp 98.1 F (36.7 C) (Oral)   Resp 16   Ht '5\' 2"'$  (1.575 m)   Wt 175 lb 1.6 oz (79.4 kg)   SpO2 98%   BMI 32.03 kg/m  BP Readings from Last 3 Encounters:  10/13/22 104/70  08/29/22 108/70  08/11/22 112/76   Wt Readings from Last 3 Encounters:  10/13/22 175 lb 1.6 oz (79.4  kg)  08/11/22 174 lb 3.2 oz (79 kg)  07/28/22 172 lb 1.6 oz (78.1 kg)      Physical Exam Exam conducted with a chaperone present.  Constitutional:      Appearance: Normal appearance.  HENT:     Head: Normocephalic and atraumatic.  Eyes:     Conjunctiva/sclera: Conjunctivae normal.  Cardiovascular:     Rate and Rhythm: Normal rate and regular rhythm.  Pulmonary:     Effort: Pulmonary effort is normal.     Breath sounds: Normal breath sounds.  Abdominal:     General: Bowel sounds are normal. There is no distension.     Palpations: Abdomen is soft.     Tenderness: There is abdominal tenderness. There is no right CVA tenderness, left CVA tenderness, guarding or rebound.     Comments: Suprapubic tenderness to palpation  Genitourinary:    Comments: External genitalia within normal limits.  Vaginal mucosa pink, moist, normal rugae.  Nonfriable cervix without lesions, no discharge noted on speculum exam. Small amount of blood in vaginal canal but not on cervix. No tears or trauma present.    Musculoskeletal:     Right lower leg: No edema.     Left lower leg: No edema.  Skin:    General: Skin is warm and dry.  Neurological:     General: No focal deficit present.     Mental Status: She is alert. Mental status is at baseline.  Psychiatric:  Mood and Affect: Mood normal.        Behavior: Behavior normal.     No results found for any visits on 10/13/22.      Assessment & Plan:   1. Suprapubic pain/Abdominal bloating: Metformin was just started for the patient in November, abdominal pain/bloating could be a side effect of this medication so it will be switched to the extended release version which should help with symptoms. Urine in the office negative today. Has follow up scheduled for May.  - POCT Urinalysis Dipstick - metFORMIN (GLUCOPHAGE-XR) 500 MG 24 hr tablet; Take 1 tablet (500 mg total) by mouth daily with breakfast.  Dispense: 90 tablet; Refill: 1  2. Vaginal  bleeding: Speculum exam today with small amount of blood in the vaginal vault, no bleeding from cervix. Most likely to due sexual intercourse. Plan for regular Pap in September.    Return in about 8 months (around 06/14/2023) for CPE.  Teodora Medici, DO

## 2022-10-13 NOTE — Patient Instructions (Addendum)
It was great seeing you today!  Plan discussed at today's visit: -Urine today -Pelvic exam with small amount of bleeding but not coming from cervix -Metformin switched to extended release version  Follow up in: 6 months, plan for Pap in September   Take care and let us know if you have any questions or concerns prior to your next visit.  Dr. Rosana Berger

## 2022-11-01 ENCOUNTER — Other Ambulatory Visit: Payer: Self-pay | Admitting: Dermatology

## 2022-11-01 DIAGNOSIS — L659 Nonscarring hair loss, unspecified: Secondary | ICD-10-CM

## 2022-11-16 ENCOUNTER — Other Ambulatory Visit: Payer: Self-pay | Admitting: Internal Medicine

## 2022-11-16 NOTE — Telephone Encounter (Signed)
Requested Prescriptions  Pending Prescriptions Disp Refills   fluticasone (FLONASE) 50 MCG/ACT nasal spray [Pharmacy Med Name: FLUTICASONE 50MCG NASAL SP (120) RX] 16 g 3    Sig: SHAKE LIQUID AND USE 2 SPRAYS IN EACH NOSTRIL DAILY     Ear, Nose, and Throat: Nasal Preparations - Corticosteroids Passed - 11/16/2022  3:37 AM      Passed - Valid encounter within last 12 months    Recent Outpatient Visits           1 month ago Suprapubic pain   [facility] Teodora Medici, DO   3 months ago Essential hypertension   [facility] Teodora Medici, DO   9 months ago Prediabetes   [facility] Teodora Medici, DO   11 months ago Deltoid bursitis, left   [place], FNP   1 year ago Strain of neck muscle, initial encounter   [facility] Teodora Medici, DO       Future Appointments             In 2 months Teodora Medici, [facility], [place]   In 3 months Ralene Bathe, MD [place]

## 2023-01-16 ENCOUNTER — Ambulatory Visit (INDEPENDENT_AMBULATORY_CARE_PROVIDER_SITE_OTHER): Payer: BC Managed Care – PPO | Admitting: Internal Medicine

## 2023-01-16 VITALS — BP 118/82 | HR 65 | Resp 14 | Ht 62.0 in | Wt 172.0 lb

## 2023-01-16 DIAGNOSIS — I10 Essential (primary) hypertension: Secondary | ICD-10-CM | POA: Diagnosis not present

## 2023-01-16 DIAGNOSIS — G4733 Obstructive sleep apnea (adult) (pediatric): Secondary | ICD-10-CM | POA: Diagnosis not present

## 2023-01-16 DIAGNOSIS — Z7189 Other specified counseling: Secondary | ICD-10-CM | POA: Diagnosis not present

## 2023-01-16 NOTE — Progress Notes (Signed)
[facility] [address] [place], [place] [address]  Pulmonary Sleep Medicine   Office Visit Note  Patient Name: Regina Dunlap DOB: 1960/10/15 MRN [id number]    Chief Complaint: Obstructive Sleep Apnea visit  Brief History:  Regina Dunlap is seen today for an annual follow up on APAP at 4-18 cmh20.  The patient has a 9 year history of sleep apnea. Patient is using PAP nightly.  The patient feels rested after sleeping with PAP.  The patient reports benefiting from PAP use. Reported sleepiness is  improved and the Epworth Sleepiness Score is 2 out of 24. The patient does not take naps. The patient complains of the following: No complaints.  The compliance download shows 87% compliance with an average use time of 7:27 hours. The AHI is 0.5.  The patient does not complain of limb movements disrupting sleep.  ROS  General: (-) fever, (-) chills, (-) night sweat Nose and Sinuses: (-) nasal stuffiness or itchiness, (-) postnasal drip, (-) nosebleeds, (-) sinus trouble. Mouth and Throat: (-) sore throat, (-) hoarseness. Neck: (-) swollen glands, (-) enlarged thyroid, (-) neck pain. Respiratory: - cough, - shortness of breath, - wheezing. Neurologic: - numbness, - tingling. Psychiatric: - anxiety, - depression   Current Medication: Outpatient Encounter Medications as of 01/16/2023  Medication Sig   metFORMIN (GLUCOPHAGE) 500 MG tablet Take 500 mg by mouth daily.   fluticasone (FLONASE) 50 MCG/ACT nasal spray SHAKE LIQUID AND USE 2 SPRAYS IN EACH NOSTRIL DAILY   olmesartan-hydrochlorothiazide (BENICAR HCT) 40-25 MG tablet Take 1 tablet by mouth daily.   omeprazole (PRILOSEC) 20 MG capsule Take 1 capsule (20 mg total) by mouth daily.   simvastatin (ZOCOR) 20 MG tablet Take 1 tablet (20 mg total) by mouth daily at 6 PM.   TURMERIC PO Take by mouth daily. Reported on 03/28/2016   VITAMIN E PO Take by mouth as needed.   [DISCONTINUED] calcium gluconate 500 MG tablet Take 1  tablet by mouth daily.   [DISCONTINUED] cholecalciferol (VITAMIN D3) 25 MCG (1000 UNIT) tablet Take 5,000 Units by mouth daily.   [DISCONTINUED] Krill Oil 1000 MG CAPS Take 1 capsule daily by mouth.    [DISCONTINUED] Magnesium 100 MG CAPS Take 1,000 mg by mouth.   [DISCONTINUED] metFORMIN (GLUCOPHAGE-XR) 500 MG 24 hr tablet Take 1 tablet (500 mg total) by mouth daily with breakfast.   [DISCONTINUED] minoxidil (LONITEN) 2.5 MG tablet Take 1/2 tab po QD.   [DISCONTINUED] minoxidil (LONITEN) 2.5 MG tablet Take 1 tablet (2.5 mg total) by mouth daily.   [DISCONTINUED] Vitamin D, Ergocalciferol, (DRISDOL) 1.25 MG (50000 UNIT) CAPS capsule Take 1 capsule (50,000 Units total) by mouth every 7 (seven) days.   No facility-administered encounter medications on file as of 01/16/2023.    Surgical History: Past Surgical History:  Procedure Laterality Date   BREAST BIOPSY Right 1999   neg   BREAST BIOPSY Left 09/01/2022   [place] Axilla Biopsy, ribbon clip, path pending   BREAST BIOPSY Left 09/01/2022   [place] LT BREAST BX W LOC DEV 1ST LESION IMG BX SPEC US GUIDE 09/01/2022 [facility]   BREAST CYST ASPIRATION Right    neg   COLONOSCOPY WITH PROPOFOL N/A 08/07/2020   Procedure: COLONOSCOPY WITH PROPOFOL;  Surgeon: Midge Minium, MD;  [facility];  Service: Endoscopy;  Laterality: N/A;  Sleep apnea priority 4   TUBAL LIGATION      Medical History: Past Medical History:  Diagnosis Date   Esophageal reflux  Fatty liver 11/04/2016   Mastodynia    Other and unspecified hyperlipidemia    Overweight (BMI 25.0-29.9) 11/04/2016   Pain in thoracic spine    Prediabetes 03/28/2016   Unspecified essential hypertension    Unspecified sleep apnea    CPAP    Family History: Non contributory to the present illness  Social History: Social History   Socioeconomic History   Marital status: Married    Spouse name: Not on file   Number of children: Not on file   Years of education: Not on  file   Highest education level: Not on file  Occupational History   Not on file  Tobacco Use   Smoking status: Never   Smokeless tobacco: Never  Vaping Use   Vaping Use: Never used  Substance and Sexual Activity   Alcohol use: Yes    Comment: social   Drug use: No   Sexual activity: Yes  Other Topics Concern   Not on file  Social History Narrative   Not on file   Social Determinants of Health   Financial Resource Strain: Low Risk  (06/19/2020)   Overall Financial Resource Strain (CARDIA)    Difficulty of Paying Living Expenses: Not hard at all  Food Insecurity: No Food Insecurity (06/19/2020)   Hunger Vital Sign    Worried About Running Out of Food in the Last Year: Never true    Ran Out of Food in the Last Year: Never true  Transportation Needs: No Transportation Needs (06/19/2020)   PRAPARE - Administrator, Civil Service (Medical): No    Lack of Transportation (Non-Medical): No  Physical Activity: Insufficiently Active (06/19/2020)   Exercise Vital Sign    Days of Exercise per Week: 3 days    Minutes of Exercise per Session: 30 min  Stress: No Stress Concern Present (06/19/2020)   [employer] of Occupational Health - Occupational Stress Questionnaire    Feeling of Stress : Only a little  Social Connections: Moderately Isolated (06/19/2020)   Social Connection and Isolation Panel [NHANES]    Frequency of Communication with Friends and Family: More than three times a week    Frequency of Social Gatherings with Friends and Family: Never    Attends Religious Services: Never    Database administrator or Organizations: No    Attends Banker Meetings: Never    Marital Status: Married  Catering manager Violence: Not At Risk (06/19/2020)   Humiliation, Afraid, Rape, and Kick questionnaire    Fear of Current or Ex-Partner: No    Emotionally Abused: No    Physically Abused: No    Sexually Abused: No    Vital Signs: Blood pressure 118/82, pulse  65, resp. rate 14, height 5\' 2"  (1.575 m), weight 172 lb (78 kg), SpO2 96 %. Body mass index is 31.46 kg/m.    Examination: General Appearance: The patient is well-developed, well-nourished, and in no distress. Neck Circumference: 38 cm Skin: Gross inspection of skin unremarkable. Head: normocephalic, no gross deformities. Eyes: no gross deformities noted. ENT: ears appear grossly normal Neurologic: Alert and oriented. No involuntary movements.  STOP BANG RISK ASSESSMENT S (snore) Have you been told that you snore?     NO   T (tired) Are you often tired, fatigued, or sleepy during the day?   NO  O (obstruction) Do you stop breathing, choke, or gasp during sleep? NO   P (pressure) Do you have or are you being treated for high blood pressure?  YES   B (BMI) Is your body index greater than 35 kg/m? NO   A (age) Are you 7 years old or older? YES   N (neck) Do you have a neck circumference greater than 16 inches?   NO   G (gender) Are you a female? NO   TOTAL STOP/BANG "YES" ANSWERS 2       A STOP-Bang score of 2 or less is considered low risk, and a score of 5 or more is high risk for having either moderate or severe OSA. For people who score 3 or 4, doctors may need to perform further assessment to determine how likely they are to have OSA.         EPWORTH SLEEPINESS SCALE:  Scale:  (0)= no chance of dozing; (1)= slight chance of dozing; (2)= moderate chance of dozing; (3)= high chance of dozing  Chance  Situtation    Sitting and reading: 0    Watching TV: 1    Sitting Inactive in public: 0    As a passenger in car: 1      Lying down to rest: 0    Sitting and talking: 0    Sitting quielty after lunch: 0    In a car, stopped in traffic: 0   TOTAL SCORE:   2 out of 24    SLEEP STUDIES:  PSG (04/08/14)  AHI 18, min SPO2 83%   CPAP COMPLIANCE DATA:  Date Range: 01/11/22 - 01/10/23  Average Daily Use: 7:27 hours  Median Use: 8:40 hours  Compliance  for > 4 Hours: 316 days  AHI: 0.5 respiratory events per hour  Days Used: 319/365  Mask Leak: 1.8  95th Percentile Pressure: 11.4 cmh20         LABS: No results found for this or any previous visit (from the past 2160 hour(s)).  Radiology: [place] LT BREAST BX W LOC DEV 1ST LESION IMG BX SPEC US GUIDE  Addendum Date: 09/02/2022   ADDENDUM REPORT: 09/02/2022 15:28 ADDENDUM: PATHOLOGY revealed: A. BREAST, LEFT; STEREOTACTIC CORE BIOPSY: - FEATURES CONSISTENT WITH ANGIOLIPOMA. - NEGATIVE FOR MALIGNANCY Pathology results are CONCORDANT with imaging findings, per Dr. Meda Klinefelter. Pathology results and recommendations were discussed with patient via telephone on 09/02/2022. Patient reported biopsy site doing well with no adverse symptoms, and only slight tenderness at the site. Post biopsy care instructions were reviewed, questions were answered and my direct phone number was provided. Patient was instructed to call [facility] for any additional questions or concerns related to biopsy site. RECOMMENDATION: Patient instructed to continue monthly self breast examinations and resume annual bilateral screening mammogram due November 2024. Pathology results reported by Randa Lynn RN on 09/02/2022. Electronically Signed   By: Meda Klinefelter M.D.   On: 09/02/2022 15:28   Result Date: 09/02/2022 CLINICAL DATA:  Probably benign LEFT breast mass EXAM: ULTRASOUND GUIDED LEFT BREAST CORE NEEDLE BIOPSY COMPARISON:  Previous exam(s). PROCEDURE: I met with the patient and we discussed the procedure of ultrasound-guided biopsy, including benefits and alternatives. We discussed the high likelihood of a successful procedure. We discussed the risks of the procedure, including infection, bleeding, tissue injury, clip migration, and inadequate sampling. Informed written consent was given. The usual time-out protocol was performed immediately prior to the procedure. Lesion quadrant: Upper outer quadrant  Using sterile technique and 1% lidocaine and 1% lidocaine with epinephrine as local anesthetic, under direct ultrasound visualization, a 14 gauge spring-loaded device was used to perform biopsy of a palpable mass  at 12 o'clock using a lateral approach. At the conclusion of the procedure a RIBBON shaped tissue marker clip was deployed into the biopsy cavity. Follow up 2 view mammogram was performed and dictated separately. IMPRESSION: Ultrasound guided biopsy of a palpable LEFT breast mass. No apparent complications. Electronically Signed: By: Meda Klinefelter M.D. On: 09/01/2022 08:50  MM CLIP PLACEMENT LEFT  Result Date: 09/01/2022 CLINICAL DATA:  Status post ultrasound-guided biopsy EXAM: 3D DIAGNOSTIC LEFT MAMMOGRAM POST ULTRASOUND BIOPSY COMPARISON:  Previous exam(s). FINDINGS: 3D Mammographic images were obtained following ultrasound guided biopsy of a palpable LEFT breast mass. The RIBBON biopsy marking clip is in expected position at the site of biopsy. IMPRESSION: Appropriate positioning of the RIBBON shaped biopsy marking clip at the site of biopsy in the upper breast. Final Assessment: Post Procedure Mammograms for Marker Placement Electronically Signed   By: Meda Klinefelter M.D.   On: 09/01/2022 08:51   No results found.  No results found.    Assessment and Plan: Patient Active Problem List   Diagnosis Date Noted   OSA on CPAP 10/25/2021   CPAP use counseling 10/25/2021   Essential hypertension 10/25/2021   Vitamin D deficiency 05/01/2020   Hot flashes 07/15/2019   Varicose veins of leg with pain, left 04/23/2019   Hypokalemia 11/23/2017   Postmenopausal bleeding 10/17/2017   Varicose veins of both lower extremities without ulcer or inflammation 04/27/2017   Sleep apnea 04/24/2017   Encounter for screening colonoscopy 04/24/2017   Obesity (BMI 30.0-34.9) 11/04/2016   Fatty liver 11/04/2016   History of concussion 06/07/2016   MCI (mild cognitive impairment) 06/07/2016    Prediabetes 03/28/2016   Memory impairment 03/28/2016   Decreased sense of smell 03/28/2016   Hypercholesterolemia with hypertriglyceridemia 10/13/2015   Encounter for screening mammogram for malignant neoplasm of breast 10/13/2015   GERD without esophagitis 10/13/2015   Screening for STD (sexually transmitted disease) 10/13/2015   Allergic rhinitis 09/16/2015   Lipoma of arm 09/16/2015   Family history of colon cancer 09/16/2015   Hypertension goal BP (blood pressure) < 140/90    1. OSA on CPAP The patient does tolerate PAP and reports  benefit from PAP use. The patient was reminded how to clean equipment and advised to replace supplies routinely. The patient was also counselled on weight loss. The compliance is good. The AHI is 0.5.   OSA on cpap- controlled. Continue with goodcompliance with pap. CPAP continues to be medically necessary to treat this patient's OSA. F/u one year.     2. CPAP use counseling CPAP Counseling: had a lengthy discussion with the patient regarding the importance of PAP therapy in management of the sleep apnea. Patient appears to understand the risk factor reduction and also understands the risks associated with untreated sleep apnea. Patient will try to make a good faith effort to remain compliant with therapy. Also instructed the patient on proper cleaning of the device including the water must be changed daily if possible and use of distilled water is preferred. Patient understands that the machine should be regularly cleaned with appropriate recommended cleaning solutions that do not damage the PAP machine for example given white vinegar and water rinses. Other methods such as ozone treatment may not be as good as these simple methods to achieve cleaning.   3. Essential hypertension Hypertension Counseling:   The following hypertensive lifestyle modification were recommended and discussed:  1. Limiting alcohol intake to less than 1 oz/day of ethanol:(24 oz of  beer or 8 oz  of wine or 2 oz of 100-proof whiskey). 2. Take baby ASA 81 mg daily. 3. Importance of regular aerobic exercise and losing weight. 4. Reduce dietary saturated fat and cholesterol intake for overall cardiovascular health. 5. Maintaining adequate dietary potassium, calcium, and magnesium intake. 6. Regular monitoring of the blood pressure. 7. Reduce sodium intake to less than 100 mmol/day (less than 2.3 gm of sodium or less than 6 gm of sodium choride)       General Counseling: I have discussed the findings of the evaluation and examination with Regina Dunlap.  I have also discussed any further diagnostic evaluation thatmay be needed or ordered today. Regina Dunlap verbalizes understanding of the findings of todays visit. We also reviewed her medications today and discussed drug interactions and side effects including but not limited excessive drowsiness and altered mental states. We also discussed that there is always a risk not just to her but also people around her. she has been encouraged to call the office with any questions or concerns that should arise related to todays visit.  No orders of the defined types were placed in this encounter.       I have personally obtained a history, examined the patient, evaluated laboratory and imaging results, formulated the assessment and plan and placed orders. This patient was seen today by Regina Kluver, PA-C in collaboration with Dr. Freda Munro.   Yevonne Pax, MD [facility] Diplomate ABMS Pulmonary Critical Care Medicine and Sleep Medicine

## 2023-01-16 NOTE — Patient Instructions (Signed)

## 2023-01-22 ENCOUNTER — Other Ambulatory Visit: Payer: Self-pay | Admitting: Internal Medicine

## 2023-01-22 DIAGNOSIS — K219 Gastro-esophageal reflux disease without esophagitis: Secondary | ICD-10-CM

## 2023-01-23 ENCOUNTER — Other Ambulatory Visit: Payer: Self-pay | Admitting: Internal Medicine

## 2023-01-24 ENCOUNTER — Other Ambulatory Visit: Payer: Self-pay

## 2023-01-24 NOTE — Telephone Encounter (Signed)
Requested medication (s) are due for refill today: yes  Requested medication (s) are on the active medication list: yes  Last refill:  4//22/24  Future visit scheduled: yes  Notes to clinic:  Unable to refill per protocol, last refill by another provider, historical provider     Requested Prescriptions  Pending Prescriptions Disp Refills   metFORMIN (GLUCOPHAGE) 500 MG tablet [Pharmacy Med Name: METFORMIN 500MG  TABLETS] 90 tablet     Sig: TAKE 1 TABLET(500 MG) BY MOUTH DAILY WITH BREAKFAST     Endocrinology:  Diabetes - Biguanides Failed - 01/23/2023  3:37 AM      Failed - B12 Level in normal range and within 720 days    No results found for: "VITAMINB12"       Passed - Cr in normal range and within 360 days    Creat  Date Value Ref Range Status  08/12/2021 1.30 (H) 0.50 - 1.03 mg/dL Final   Creatinine, Ser  Date Value Ref Range Status  07/28/2022 0.88 0.57 - 1.00 mg/dL Final         Passed - HBA1C is between 0 and 7.9 and within 180 days    Hgb A1c MFr Bld  Date Value Ref Range Status  07/28/2022 6.2 (H) 4.8 - 5.6 % Final    Comment:             Prediabetes: 5.7 - 6.4          Diabetes: >6.4          Glycemic control for adults with diabetes: <7.0          Passed - eGFR in normal range and within 360 days    GFR, Est African American  Date Value Ref Range Status  02/09/2021 103 > OR = 60 mL/min/1.71m2 Final   GFR, Est Non African American  Date Value Ref Range Status  02/09/2021 88 > OR = 60 mL/min/1.42m2 Final   eGFR  Date Value Ref Range Status  07/28/2022 76 >59 mL/min/1.73 Final         Passed - Valid encounter within last 6 months    Recent Outpatient Visits           3 months ago Suprapubic pain   [place] [facility] Margarita Mail, DO   6 months ago Essential hypertension   [facility] Margarita Mail, DO   12 months ago Prediabetes   [facility] Margarita Mail,  DO   1 year ago Deltoid bursitis, left   [facility] Berniece Salines, FNP   1 year ago Strain of neck muscle, initial encounter   [facility] Margarita Mail, DO       Future Appointments             In 3 weeks Margarita Mail, DO [place] [facility]   In 1 month Deirdre Evener, MD [place]            Passed - CBC within normal limits and completed in the last 12 months    WBC  Date Value Ref Range Status  07/28/2022 4.2 3.4 - 10.8 x10E3/uL Final  02/09/2021 6.2 3.8 - 10.8 Thousand/uL Final   RBC  Date Value Ref Range Status  07/28/2022 4.56 3.77 - 5.28 x10E6/uL Final  02/09/2021 4.45 3.80 - 5.10 Million/uL Final   Hemoglobin  Date Value Ref Range Status  07/28/2022 13.1  11.1 - 15.9 g/dL Final   Hematocrit  Date Value Ref Range Status  07/28/2022 40.3 34.0 - 46.6 % Final   MCHC  Date Value Ref Range Status  07/28/2022 32.5 31.5 - 35.7 g/dL Final  16/06/9603 54.0 32.0 - 36.0 g/dL Final   [facility]  Date Value Ref Range Status  07/28/2022 28.7 26.6 - 33.0 pg Final  02/09/2021 28.5 27.0 - 33.0 pg Final   MCV  Date Value Ref Range Status  07/28/2022 88 79 - 97 fL Final   No results found for: "PLTCOUNTKUC", "LABPLAT", "POCPLA" RDW  Date Value Ref Range Status  07/28/2022 13.1 11.7 - 15.4 % Final

## 2023-01-24 NOTE — Telephone Encounter (Signed)
Requested Prescriptions  Pending Prescriptions Disp Refills   omeprazole (PRILOSEC) 20 MG capsule [Pharmacy Med Name: OMEPRAZOLE 20MG  CAPSULES] 90 capsule 1    Sig: TAKE 1 CAPSULE(20 MG) BY MOUTH DAILY     Gastroenterology: Proton Pump Inhibitors Passed - 01/22/2023  3:39 AM      Passed - Valid encounter within last 12 months    Recent Outpatient Visits           3 months ago Suprapubic pain   [facility] Margarita Mail, DO   6 months ago Essential hypertension   [facility] Margarita Mail, DO   12 months ago Prediabetes   [facility] Margarita Mail, DO   1 year ago Deltoid bursitis, left   [facility] Berniece Salines, FNP   1 year ago Strain of neck muscle, initial encounter   [facility] Margarita Mail, DO       Future Appointments             In 3 weeks Margarita Mail, DO [facility]   In 1 month Deirdre Evener, MD [facility]             fluticasone [facility]) 50 MCG/ACT nasal spray [Pharmacy Med Name: FLUTICASONE NASAL SP (120) RX] 16 g 1    Sig: SHAKE LIQUID AND USE 2 SPRAYS IN EACH NOSTRIL DAILY     Ear, Nose, and Throat: Nasal Preparations - Corticosteroids Passed - 01/22/2023  3:39 AM      Passed - Valid encounter within last 12 months    Recent Outpatient Visits           3 months ago Suprapubic pain   [facility] Margarita Mail, DO   6 months ago Essential hypertension   [facility] Margarita Mail, DO   12 months ago Prediabetes   [facility] Margarita Mail, DO   1 year ago Deltoid bursitis, left   [facility] Berniece Salines, FNP   1 year ago Strain of neck muscle, initial encounter   [facility]  Margarita Mail, DO       Future Appointments             In 3 weeks Margarita Mail, DO [facility]   In 1 month Deirdre Evener, MD [facility]

## 2023-01-26 ENCOUNTER — Ambulatory Visit: Payer: BC Managed Care – PPO | Admitting: Internal Medicine

## 2023-02-16 NOTE — Progress Notes (Deleted)
Established Patient Office Visit  Subjective   Patient ID: Regina Dunlap, female    DOB: 05/20/1961  Age: 62 y.o. MRN: [id number]  No chief complaint on file.   HPI  Patient here for follow up on chronic medical conditions.  Hypertension/OSA: -Medications: Olmesartan-HCTZ 40-25 mg -Patient is compliant with above medications and reports no side effects. -Checking BP at home (average): doesn't check, no machine  -Denies any SOB, CP, vision changes, LE edema or symptoms of hypotension -Compliant with CPAP  HLD: -Medications: Zocor 20 mg -Patient is compliant with above medications and reports no side effects.  -Last lipid panel: Lipid Panel     Component Value Date/Time   CHOL 180 07/28/2022 1009   TRIG 179 (H) 07/28/2022 1009   HDL 51 07/28/2022 1009   CHOLHDL 3.5 07/28/2022 1009   CHOLHDL 3.0 08/12/2021 1116   VLDL 38 (H) 04/24/2017 1528   LDLCALC 98 07/28/2022 1009   LDLCALC 79 08/12/2021 1116   LABVLDL 31 07/28/2022 1009    GERD: -Currently on Prilosec 20 mg -Symptoms include reflux, which is controlled with the medication. Denies abdominal pain, nausea, vomiting, changes in appetite.   Pre-Diabetes: -Last A1c 11/23 6.2% -Started on metformin 500 mg once daily at last office visit which she is doing well with, tolerating well but sometimes forgets to take  Vitamin D Deficiency -Last checked 5/22, low at 26 -Currently on Vitamin D and calcium over the counter  Health Maintenance: -Blood work UTD -Mammogram 12/22 Birads-2 - scheduled 08/29/22 -Colonoscopy 11/21, repeat in 5 years  Review of Systems  Constitutional:  Negative for chills and fever.  Eyes:  Negative for blurred vision.  Respiratory:  Negative for cough.   Cardiovascular:  Negative for chest pain and palpitations.  Gastrointestinal:  Negative for abdominal pain, diarrhea, heartburn, nausea and vomiting.  Musculoskeletal:  Negative for neck pain.  Neurological:  Negative for dizziness  and headaches.      Objective:     There were no vitals taken for this visit. BP Readings from Last 3 Encounters:  01/16/23 118/82  10/13/22 104/70  08/29/22 108/70   Wt Readings from Last 3 Encounters:  01/16/23 172 lb (78 kg)  10/13/22 175 lb 1.6 oz (79.4 kg)  08/11/22 174 lb 3.2 oz (79 kg)      Physical Exam Exam conducted with a chaperone present.  Constitutional:      Appearance: Normal appearance.  HENT:     Head: Normocephalic and atraumatic.  Eyes:     Conjunctiva/sclera: Conjunctivae normal.  Cardiovascular:     Rate and Rhythm: Normal rate and regular rhythm.  Pulmonary:     Effort: Pulmonary effort is normal.     Breath sounds: Normal breath sounds.  Chest:  Breasts:    Right: Normal. No swelling, inverted nipple, mass, nipple discharge, skin change or tenderness.     Left: Tenderness present. No swelling, inverted nipple, mass, nipple discharge or skin change.     Comments: Patient palpating small, hard lesion at the 12 o'clock position however this was not palpated on exam, patient does endorse tenderness.  Musculoskeletal:     Right lower leg: No edema.     Left lower leg: No edema.  Skin:    General: Skin is warm and dry.  Neurological:     General: No focal deficit present.     Mental Status: She is alert. Mental status is at baseline.  Psychiatric:        Mood and Affect:  Mood normal.        Behavior: Behavior normal.      No results found for any visits on 02/17/23.  Last CBC Lab Results  Component Value Date   WBC 4.2 07/28/2022   HGB 13.1 07/28/2022   HCT 40.3 07/28/2022   MCV 88 07/28/2022   MCH 28.7 07/28/2022   RDW 13.1 07/28/2022   PLT 272 07/28/2022   Last metabolic panel Lab Results  Component Value Date   GLUCOSE 98 07/28/2022   NA 143 07/28/2022   K 3.9 07/28/2022   CL 101 07/28/2022   CO2 24 07/28/2022   BUN 13 07/28/2022   CREATININE 0.88 07/28/2022   EGFR 76 07/28/2022   CALCIUM 9.4 07/28/2022   PROT 7.2  07/28/2022   ALBUMIN 4.7 07/28/2022   LABGLOB 2.5 07/28/2022   AGRATIO 1.9 07/28/2022   BILITOT 0.3 07/28/2022   ALKPHOS 104 07/28/2022   AST 22 07/28/2022   ALT 18 07/28/2022   Last lipids Lab Results  Component Value Date   CHOL 180 07/28/2022   HDL 51 07/28/2022   LDLCALC 98 07/28/2022   TRIG 179 (H) 07/28/2022   CHOLHDL 3.5 07/28/2022   Last hemoglobin A1c Lab Results  Component Value Date   HGBA1C 6.2 (H) 07/28/2022   Last thyroid functions Lab Results  Component Value Date   TSH 3.420 04/14/2022   Last vitamin D Lab Results  Component Value Date   VD25OH 31.0 07/28/2022   Last vitamin B12 and Folate No results found for: "VITAMINB12", "FOLATE"    The 10-year ASCVD risk score (Arnett DK, et al., 2019) is: 3.3%    Assessment & Plan:   1. Essential hypertension: Chronic and stable.  At goal here today.  Continue olmesartan-HCTZ 40-25 mg daily, refilled today.  Due for annual labs including CBC and CMP.  Follow-up in 6 months.  - CBC w/Diff/Platelet - Comprehensive Metabolic Panel (CMET) - olmesartan-hydrochlorothiazide (BENICAR HCT) 40-25 MG tablet; Take 1 tablet by mouth daily.  Dispense: 90 tablet; Refill: 3  2. Hypercholesterolemia with hypertriglyceridemia: Due for annual recheck.  Continue Zocor 20 mg, refilled today.  - Lipid Profile - simvastatin (ZOCOR) 20 MG tablet; Take 1 tablet (20 mg total) by mouth daily at 6 PM.  Dispense: 90 tablet; Refill: 3  3. Prediabetes: Doing well on metformin 500 mg daily, refilled today.  Due for A1c check.  - HgB A1c - metFORMIN (GLUCOPHAGE) 500 MG tablet; Take 1 tablet (500 mg total) by mouth daily with breakfast.  Dispense: 90 tablet; Refill: 1  4. GERD without esophagitis: Stable.  Continue Prilosec 20 mg, refilled today.  - omeprazole (PRILOSEC) 20 MG capsule; Take 1 capsule (20 mg total) by mouth daily.  Dispense: 90 capsule; Refill: 1  5. Vitamin D deficiency: Recheck vitamin D today with above labs.   Patient is currently on over-the-counter vitamin D after completing prescription strength supplements.  - Vitamin D (25 hydroxy)  6. Mass of upper outer quadrant of left breast/Encounter for screening mammogram for malignant neoplasm of breast: Patient has noticed a small hard lump at the 12 o'clock position in her left breast a few months ago, states it is unchanged but tender when palpated.  I could not palpate this mass on exam, however we will change her screening mammogram to diagnostic and obtain bilateral ultrasounds.  - MM DIAG BREAST TOMO BILATERAL; Future - US BREAST LTD UNI LEFT INC AXILLA; Future - US BREAST LTD UNI RIGHT INC AXILLA; Future  7. Need  for influenza vaccination: Flu vaccine administered today.  - Flu Vaccine QUAD 6+ mos PF IM (Fluarix Quad PF)   No follow-ups on file.    Margarita Mail, DO

## 2023-02-17 ENCOUNTER — Ambulatory Visit: Payer: BC Managed Care – PPO | Admitting: Internal Medicine

## 2023-02-27 NOTE — Progress Notes (Unsigned)
Established Patient Office Visit  Subjective   Patient ID: Regina Dunlap, female    DOB: 06-24-1961  Age: 62 y.o. MRN: [id number]  No chief complaint on file.   HPI  Patient here for follow up on chronic medical conditions.  Hypertension/OSA: -Medications: Olmesartan-HCTZ 40-25 mg -Patient is compliant with above medications and reports no side effects. -Checking BP at home (average): doesn't check, no machine  -Denies any SOB, CP, vision changes, LE edema or symptoms of hypotension -Compliant with CPAP  HLD: -Medications: Zocor 20 mg -Patient is compliant with above medications and reports no side effects.  -Last lipid panel: Lipid Panel     Component Value Date/Time   CHOL 180 07/28/2022 1009   TRIG 179 (H) 07/28/2022 1009   HDL 51 07/28/2022 1009   CHOLHDL 3.5 07/28/2022 1009   CHOLHDL 3.0 08/12/2021 1116   VLDL 38 (H) 04/24/2017 1528   LDLCALC 98 07/28/2022 1009   LDLCALC 79 08/12/2021 1116   LABVLDL 31 07/28/2022 1009    GERD: -Currently on Prilosec 20 mg -Symptoms include reflux, which is controlled with the medication. Denies abdominal pain, nausea, vomiting, changes in appetite.   Pre-Diabetes: -Last A1c 11/23 6.2% -Started on metformin 500 mg once daily at last office visit which she is doing well with, tolerating well but sometimes forgets to take  Vitamin D Deficiency -Last checked 5/22, low at 26 -Currently on Vitamin D and calcium over the counter  Health Maintenance: -Blood work UTD -Mammogram 12/22 Birads-2 - scheduled 08/29/22 -Colonoscopy 11/21, repeat in 5 years  Review of Systems  Constitutional:  Negative for chills and fever.  Eyes:  Negative for blurred vision.  Respiratory:  Negative for cough.   Cardiovascular:  Negative for chest pain and palpitations.  Gastrointestinal:  Negative for abdominal pain, diarrhea, heartburn, nausea and vomiting.  Musculoskeletal:  Negative for neck pain.  Neurological:  Negative for dizziness  and headaches.      Objective:     There were no vitals taken for this visit. BP Readings from Last 3 Encounters:  01/16/23 118/82  10/13/22 104/70  08/29/22 108/70   Wt Readings from Last 3 Encounters:  01/16/23 172 lb (78 kg)  10/13/22 175 lb 1.6 oz (79.4 kg)  08/11/22 174 lb 3.2 oz (79 kg)      Physical Exam Exam conducted with a chaperone present.  Constitutional:      Appearance: Normal appearance.  HENT:     Head: Normocephalic and atraumatic.  Eyes:     Conjunctiva/sclera: Conjunctivae normal.  Cardiovascular:     Rate and Rhythm: Normal rate and regular rhythm.  Pulmonary:     Effort: Pulmonary effort is normal.     Breath sounds: Normal breath sounds.  Chest:  Breasts:    Right: Normal. No swelling, inverted nipple, mass, nipple discharge, skin change or tenderness.     Left: Tenderness present. No swelling, inverted nipple, mass, nipple discharge or skin change.     Comments: Patient palpating small, hard lesion at the 12 o'clock position however this was not palpated on exam, patient does endorse tenderness.  Musculoskeletal:     Right lower leg: No edema.     Left lower leg: No edema.  Skin:    General: Skin is warm and dry.  Neurological:     General: No focal deficit present.     Mental Status: She is alert. Mental status is at baseline.  Psychiatric:        Mood and Affect:  Mood normal.        Behavior: Behavior normal.      No results found for any visits on 02/28/23.  Last CBC Lab Results  Component Value Date   WBC 4.2 07/28/2022   HGB 13.1 07/28/2022   HCT 40.3 07/28/2022   MCV 88 07/28/2022   MCH 28.7 07/28/2022   RDW 13.1 07/28/2022   PLT 272 07/28/2022   Last metabolic panel Lab Results  Component Value Date   GLUCOSE 98 07/28/2022   NA 143 07/28/2022   K 3.9 07/28/2022   CL 101 07/28/2022   CO2 24 07/28/2022   BUN 13 07/28/2022   CREATININE 0.88 07/28/2022   EGFR 76 07/28/2022   CALCIUM 9.4 07/28/2022   PROT 7.2  07/28/2022   ALBUMIN 4.7 07/28/2022   LABGLOB 2.5 07/28/2022   AGRATIO 1.9 07/28/2022   BILITOT 0.3 07/28/2022   ALKPHOS 104 07/28/2022   AST 22 07/28/2022   ALT 18 07/28/2022   Last lipids Lab Results  Component Value Date   CHOL 180 07/28/2022   HDL 51 07/28/2022   LDLCALC 98 07/28/2022   TRIG 179 (H) 07/28/2022   CHOLHDL 3.5 07/28/2022   Last hemoglobin A1c Lab Results  Component Value Date   HGBA1C 6.2 (H) 07/28/2022   Last thyroid functions Lab Results  Component Value Date   TSH 3.420 04/14/2022   Last vitamin D Lab Results  Component Value Date   VD25OH 31.0 07/28/2022   Last vitamin B12 and Folate No results found for: "VITAMINB12", "FOLATE"    The 10-year ASCVD risk score (Arnett DK, et al., 2019) is: 3.3%    Assessment & Plan:   1. Essential hypertension: Chronic and stable.  At goal here today.  Continue olmesartan-HCTZ 40-25 mg daily, refilled today.  Due for annual labs including CBC and CMP.  Follow-up in 6 months.  - CBC w/Diff/Platelet - Comprehensive Metabolic Panel (CMET) - olmesartan-hydrochlorothiazide (BENICAR HCT) 40-25 MG tablet; Take 1 tablet by mouth daily.  Dispense: 90 tablet; Refill: 3  2. Hypercholesterolemia with hypertriglyceridemia: Due for annual recheck.  Continue Zocor 20 mg, refilled today.  - Lipid Profile - simvastatin (ZOCOR) 20 MG tablet; Take 1 tablet (20 mg total) by mouth daily at 6 PM.  Dispense: 90 tablet; Refill: 3  3. Prediabetes: Doing well on metformin 500 mg daily, refilled today.  Due for A1c check.  - HgB A1c - metFORMIN (GLUCOPHAGE) 500 MG tablet; Take 1 tablet (500 mg total) by mouth daily with breakfast.  Dispense: 90 tablet; Refill: 1  4. GERD without esophagitis: Stable.  Continue Prilosec 20 mg, refilled today.  - omeprazole (PRILOSEC) 20 MG capsule; Take 1 capsule (20 mg total) by mouth daily.  Dispense: 90 capsule; Refill: 1  5. Vitamin D deficiency: Recheck vitamin D today with above labs.   Patient is currently on over-the-counter vitamin D after completing prescription strength supplements.  - Vitamin D (25 hydroxy)  6. Mass of upper outer quadrant of left breast/Encounter for screening mammogram for malignant neoplasm of breast: Patient has noticed a small hard lump at the 12 o'clock position in her left breast a few months ago, states it is unchanged but tender when palpated.  I could not palpate this mass on exam, however we will change her screening mammogram to diagnostic and obtain bilateral ultrasounds.  - MM DIAG BREAST TOMO BILATERAL; Future - US BREAST LTD UNI LEFT INC AXILLA; Future - US BREAST LTD UNI RIGHT INC AXILLA; Future  7. Need  for influenza vaccination: Flu vaccine administered today.  - Flu Vaccine QUAD 6+ mos PF IM (Fluarix Quad PF)   No follow-ups on file.    Margarita Mail, DO

## 2023-02-28 ENCOUNTER — Ambulatory Visit (INDEPENDENT_AMBULATORY_CARE_PROVIDER_SITE_OTHER): Payer: BC Managed Care – PPO | Admitting: Internal Medicine

## 2023-02-28 ENCOUNTER — Encounter: Payer: Self-pay | Admitting: Internal Medicine

## 2023-02-28 VITALS — BP 116/76 | HR 63 | Temp 98.0°F | Resp 18 | Ht 62.0 in | Wt 173.3 lb

## 2023-02-28 DIAGNOSIS — I10 Essential (primary) hypertension: Secondary | ICD-10-CM | POA: Diagnosis not present

## 2023-02-28 DIAGNOSIS — E782 Mixed hyperlipidemia: Secondary | ICD-10-CM | POA: Diagnosis not present

## 2023-02-28 DIAGNOSIS — S39012A Strain of muscle, fascia and tendon of lower back, initial encounter: Secondary | ICD-10-CM

## 2023-02-28 DIAGNOSIS — R7303 Prediabetes: Secondary | ICD-10-CM | POA: Diagnosis not present

## 2023-02-28 LAB — POCT GLYCOSYLATED HEMOGLOBIN (HGB A1C): Hemoglobin A1C: 6.1 % — AB (ref 4.0–5.6)

## 2023-02-28 MED ORDER — METFORMIN HCL 500 MG PO TABS: 500.0000 mg | ORAL_TABLET | Freq: Every day | ORAL | 1 refills | Status: DC

## 2023-02-28 MED ORDER — TIZANIDINE HCL 4 MG PO TABS: 4.0000 mg | ORAL_TABLET | Freq: Four times a day (QID) | ORAL | 0 refills | Status: DC | PRN

## 2023-03-05 ENCOUNTER — Other Ambulatory Visit: Payer: Self-pay | Admitting: Internal Medicine

## 2023-03-05 DIAGNOSIS — S39012A Strain of muscle, fascia and tendon of lower back, initial encounter: Secondary | ICD-10-CM

## 2023-03-06 NOTE — Telephone Encounter (Signed)
Requested medication (s) are due for refill today:   Requested medication (s) are on the active medication list: yES  Last refill:  6/424  Future visit scheduled:  Notes to clinic:  Not delegated.    Requested Prescriptions  Pending Prescriptions Disp Refills   tiZANidine (ZANAFLEX) 4 MG tablet [Pharmacy Med Name: TIZANIDINE 4MG  TABLETS] 30 tablet 0    Sig: TAKE 1 TABLET(4 MG) BY MOUTH EVERY 6 HOURS AS NEEDED FOR MUSCLE SPASMS     Not Delegated - Cardiovascular:  Alpha-2 Agonists - tizanidine Failed - 03/05/2023  3:38 AM      Failed - This refill cannot be delegated      Passed - Valid encounter within last 6 months    Recent Outpatient Visits           6 days ago Essential hypertension   [facility] Margarita Mail, DO   4 months ago Suprapubic pain   [facility] Margarita Mail, DO   7 months ago Essential hypertension   [facility] Margarita Mail, DO   1 year ago Prediabetes   [facility] Margarita Mail, DO   1 year ago Deltoid bursitis, left   [facility] Berniece Salines, FNP       Future Appointments             In 2 days Deirdre Evener, MD [facility]   In 5 months Margarita Mail, DO [facility]

## 2023-03-08 ENCOUNTER — Ambulatory Visit: Payer: BC Managed Care – PPO | Admitting: Dermatology

## 2023-04-04 ENCOUNTER — Encounter: Payer: Self-pay | Admitting: Family Medicine

## 2023-04-04 ENCOUNTER — Ambulatory Visit (INDEPENDENT_AMBULATORY_CARE_PROVIDER_SITE_OTHER): Payer: BC Managed Care – PPO | Admitting: Family Medicine

## 2023-04-04 VITALS — BP 120/82 | HR 75 | Temp 98.0°F | Resp 16 | Ht 62.0 in | Wt 176.8 lb

## 2023-04-04 DIAGNOSIS — Z8669 Personal history of other diseases of the nervous system and sense organs: Secondary | ICD-10-CM | POA: Diagnosis not present

## 2023-04-04 DIAGNOSIS — R591 Generalized enlarged lymph nodes: Secondary | ICD-10-CM

## 2023-04-04 DIAGNOSIS — J069 Acute upper respiratory infection, unspecified: Secondary | ICD-10-CM | POA: Diagnosis not present

## 2023-04-04 DIAGNOSIS — R519 Headache, unspecified: Secondary | ICD-10-CM | POA: Diagnosis not present

## 2023-04-04 MED ORDER — SUMATRIPTAN SUCCINATE 50 MG PO TABS: 25.0000 mg | ORAL_TABLET | ORAL | 0 refills | Status: AC | PRN

## 2023-04-04 NOTE — Patient Instructions (Signed)
I recommend you follow up in office if your lymph nodes do not improve in 3-4 weeks.  It is common to have lymph nodes get larger or tender when you are ill with something.  They should return to normal in a few weeks.  If they do not return to normal then at that time it would be appropriate to recheck you and possibly get some labs done. Its very common and expected and is called reactive lymphadenopathy.  I suspect you have a viral illness which can cause all your symptoms and should be self limiting.  You can continue supportive and symptomatic measures such at taking tylenol or ibuprofen over the counter, push a lot of clear fluids and rest.  Viral Illness, Adult Viruses are tiny germs that can get into a person's body and cause illness. There are many different types of viruses. And they cause many types of illness. Viral illnesses can range from mild to severe. They can affect various parts of the body. Short-term conditions that are caused by a virus include colds and flu (influenza) and stomach viruses. Long-term conditions that are caused by a virus include herpes, shingles, and human immunodeficiency virus (HIV) infection. A few viruses have been linked to certain cancers. What are the causes? Many types of viruses can cause illness. Viruses get into cells in your body, multiply, and cause the infected cells to work differently or die. When these cells die, they release more of the virus. When this happens, you get symptoms of the illness and the virus spreads to other cells. If the virus takes over how the cell works, it can cause the cell to divide and grow out of control. This happens when a virus causes cancer. Different viruses get into the body in different ways. You can get a virus by: Swallowing food or water that has come in contact with the virus. Breathing in droplets that have been coughed or sneezed into the air by an infected person. Touching a surface that has the virus on it  and then touching your eyes, nose, or mouth. Being bitten by an insect or animal that carries the virus. Having sexual contact with a person who is infected with the virus. Being exposed to blood or fluids that contain the virus, either through an open cut or during a transfusion. If a virus enters your body, your body's disease-fighting system (immune system) will try to fight the virus. You may be at higher risk for a viral illness if your immune system is weak. What are the signs or symptoms? Symptoms depend on the type of virus and the location of the cells that it gets into. Symptoms can include: For cold and flu viruses: Fever. Headache. Sore throat. Muscle aches. Stuffy nose (nasal congestion). Cough. For stomach (gastrointestinal) viruses: Fever. Pain in the abdomen. Nausea or vomiting. Diarrhea. For liver viruses (hepatitis): Loss of appetite. Feeling tired. Skin or the white parts of your eyes turning yellow (jaundice). For brain and spinal cord viruses: Fever. Headache. Stiff neck. Nausea and vomiting. Confusion or being sleepy. For skin viruses: Warts. Itching. Rash. For sexually transmitted viruses: Discharge. Swelling. Redness. Rash. How is this diagnosed? This condition may be diagnosed based on one or more of these: Your symptoms and medical history. A physical exam. Tests, such as: Blood tests. Tests on a sample of mucus from your lungs (sputum sample). Tests on a poop (stool) sample. Tests on a swab of body fluids or a skin sore (lesion). How is  this treated? Viruses can be hard to treat because they live within cells. Antibiotics do not treat viruses because these medicines do not get inside cells. Treatment for a viral illness may include: Resting and drinking a lot of fluids. Medicines to treat symptoms. These can include over-the-counter medicine for pain and fever, medicines for cough or congestion, and medicines for diarrhea. Antiviral  medicines. These medicines are available only for certain types of viruses. Some viral illnesses can be prevented with vaccinations. A common example is the flu shot. Follow these instructions at home: Medicines Take over-the-counter and prescription medicines only as told by your health care provider. If you were prescribed an antiviral medicine, take it as told by your provider. Do not stop taking the antiviral even if you start to feel better. Know when antibiotics are needed and when they are not needed. Antibiotics do not treat viruses. You may get an antibiotic if your provider thinks that you may have, or are at risk for, a bacterial infection and you have a viral infection. Do not ask for an antibiotic prescription if you have been diagnosed with a viral illness. Antibiotics will not make your illness go away faster. Taking antibiotics when they are not needed can lead to antibiotic resistance. When this develops, the medicine no longer works against the bacteria that it normally fights. General instructions Drink enough fluids to keep your pee (urine) pale yellow. Rest as much as possible. Return to your normal activities as told by your provider. Ask your provider what activities are safe for you. How is this prevented? To lower your risk of getting another viral illness: Wash your hands often with soap and water for at least 20 seconds. If soap and water are not available, use hand sanitizer. Avoid touching your nose, eyes, and mouth, especially if you have not washed your hands recently. If anyone in your household has a viral infection, clean all household surfaces that may have been in contact with the virus. Use soap and hot water. You may also use a commercially prepared, bleach-containing solution. Stay away from people who are sick with symptoms of a viral infection. Do not share items such as toothbrushes and water bottles with other people. Keep your vaccinations up to date.  This includes getting a yearly flu shot. Eat a healthy diet and get plenty of rest. Contact a health care provider if: You have symptoms of a viral illness that do not go away. Your symptoms come back after going away. Your symptoms get worse. Get help right away if: You have trouble breathing. You have a severe headache or a stiff neck. You have severe vomiting or pain in your abdomen. These symptoms may be an emergency. Get help right away. Call 911. Do not wait to see if the symptoms will go away. Do not drive yourself to the hospital. This information is not intended to replace advice given to you by your health care provider. Make sure you discuss any questions you have with your health care provider. Document Revised: 09/28/2022 Document Reviewed: 07/13/2022 Elsevier Patient Education  2024 [employer].

## 2023-04-04 NOTE — Progress Notes (Signed)
Patient ID: Regina Dunlap, female    DOB: 1961-07-26, 62 y.o.   MRN: [id number]  PCP: Margarita Mail, DO  Chief Complaint  Patient presents with   Headache    On the back left side of head onset since yesterday she took Advair and naproxen no help. Woke up today still there but not as bad as yesterday. Also noticed her lymph nodes swollen    Subjective:   Regina Dunlap is a 62 y.o. female, presents to clinic with CC of the following:  Headache  This is a recurrent problem. The current episode started yesterday. The problem has been gradually improving. The pain is located in the Left unilateral region. Her past medical history is significant for migraines in the family.   Associated tender lymph nodes, she had generalized bodyaches and some URI symptoms all of which began yesterday   Patient Active Problem List   Diagnosis Date Noted   OSA on CPAP 10/25/2021   CPAP use counseling 10/25/2021   Essential hypertension 10/25/2021   Vitamin D deficiency 05/01/2020   Hot flashes 07/15/2019   Varicose veins of leg with pain, left 04/23/2019   Hypokalemia 11/23/2017   Postmenopausal bleeding 10/17/2017   Varicose veins of both lower extremities without ulcer or inflammation 04/27/2017   Sleep apnea 04/24/2017   Encounter for screening colonoscopy 04/24/2017   Obesity (BMI 30.0-34.9) 11/04/2016   Fatty liver 11/04/2016   History of concussion 06/07/2016   MCI (mild cognitive impairment) 06/07/2016   Prediabetes 03/28/2016   Memory impairment 03/28/2016   Decreased sense of smell 03/28/2016   Hypercholesterolemia with hypertriglyceridemia 10/13/2015   Encounter for screening mammogram for malignant neoplasm of breast 10/13/2015   GERD without esophagitis 10/13/2015   Screening for STD (sexually transmitted disease) 10/13/2015   Allergic rhinitis 09/16/2015   Lipoma of arm 09/16/2015   Family history of colon cancer 09/16/2015   Hypertension goal BP (blood  pressure) < 140/90       Current Outpatient Medications:    fluticasone (FLONASE) 50 MCG/ACT nasal spray, SHAKE LIQUID AND USE 2 SPRAYS IN EACH NOSTRIL DAILY, Disp: 16 g, Rfl: 1   metFORMIN (GLUCOPHAGE) 500 MG tablet, Take 1 tablet (500 mg total) by mouth daily with breakfast., Disp: 90 tablet, Rfl: 1   olmesartan-hydrochlorothiazide (BENICAR HCT) 40-25 MG tablet, Take 1 tablet by mouth daily., Disp: 90 tablet, Rfl: 3   omeprazole (PRILOSEC) 20 MG capsule, TAKE 1 CAPSULE(20 MG) BY MOUTH DAILY, Disp: 90 capsule, Rfl: 1   simvastatin (ZOCOR) 20 MG tablet, Take 1 tablet (20 mg total) by mouth daily at 6 PM., Disp: 90 tablet, Rfl: 3   tiZANidine (ZANAFLEX) 4 MG tablet, Take 1 tablet (4 mg total) by mouth every 6 (six) hours as needed for muscle spasms., Disp: 30 tablet, Rfl: 0   TURMERIC PO, Take by mouth daily. Reported on 03/28/2016, Disp: , Rfl:    VITAMIN E PO, Take by mouth as needed., Disp: , Rfl:    Allergies  Allergen Reactions   Amoxil [Amoxicillin]    Ciprofloxacin Diarrhea, Nausea And Vomiting and Other (See Comments)    Other Reaction(s): diarrhea, vomiting   Lisinopril Cough   Shellfish Allergy Other (See Comments)    Patient states that she currently does not have a shell fish allergy    Migraine Formula [Aspirin-Acetaminophen-Caffeine] Rash    Midrina; broke out in rash    Povidone Iodine Rash     Social History   Tobacco Use  Smoking status: Never   Smokeless tobacco: Never  Vaping Use   Vaping Use: Never used  Substance Use Topics   Alcohol use: Yes    Comment: social   Drug use: No      Chart Review Today: I personally reviewed active problem list, medication list, allergies, family history, social history, health maintenance, notes from last encounter, lab results, imaging with the patient/caregiver today.   Review of Systems  Constitutional: Negative.   HENT: Negative.    Eyes: Negative.   Respiratory: Negative.    Cardiovascular: Negative.    Gastrointestinal: Negative.   Endocrine: Negative.   Genitourinary: Negative.   Musculoskeletal: Negative.   Skin: Negative.   Allergic/Immunologic: Negative.   Neurological:  Positive for headaches.  Hematological: Negative.   Psychiatric/Behavioral: Negative.    All other systems reviewed and are negative.      Objective:   Vitals:   04/04/23 1315  BP: 120/82  Pulse: 75  Resp: 16  Temp: 98 F (36.7 C)  TempSrc: Oral  SpO2: 96%  Weight: 176 lb 12.8 oz (80.2 kg)  Height: 5\' 2"  (1.575 m)    Body mass index is 32.34 kg/m.  Physical Exam Vitals and nursing note reviewed.  Constitutional:      General: She is not in acute distress.    Appearance: Normal appearance. She is well-developed. She is not ill-appearing, toxic-appearing or diaphoretic.  HENT:     Head: Normocephalic and atraumatic.     Right Ear: External ear normal.     Left Ear: External ear normal.     Nose: Rhinorrhea present.     Mouth/Throat:     Mouth: Mucous membranes are moist.     Pharynx: Oropharynx is clear. Posterior oropharyngeal erythema present.  Eyes:     General: No scleral icterus.       Right eye: No discharge.        Left eye: No discharge.     Conjunctiva/sclera: Conjunctivae normal.  Neck:     Trachea: Trachea and phonation normal. No tracheal deviation.  Cardiovascular:     Rate and Rhythm: Normal rate and regular rhythm.     Pulses: Normal pulses.     Heart sounds: Normal heart sounds.  Pulmonary:     Effort: Pulmonary effort is normal. No respiratory distress.     Breath sounds: No stridor.  Abdominal:     General: Bowel sounds are normal.     Palpations: Abdomen is soft.  Musculoskeletal:        General: Normal range of motion.     Cervical back: Full passive range of motion without pain, normal range of motion and neck supple.  Lymphadenopathy:     Cervical: No cervical adenopathy.  Skin:    General: Skin is warm and dry.     Findings: No rash.  Neurological:      Mental Status: She is alert.     Motor: No abnormal muscle tone.     Coordination: Coordination normal.     Comments: CRANIAL NERVES:   II: Pupils equal and reactive, no RAPD   III, IV, VI: EOM intact, no gaze preference or deviation, no nystagmus.   V: normal sensation in V1, V2, and V3 segments bilaterally   VII: no asymmetry, no nasolabial fold flattening   VIII: normal hearing to speech   IX, X: normal palatal elevation, no uvular deviation   XI: 5/5 head turn and 5/5 shoulder shrug bilaterally   XII: midline tongue protrusion  MOTOR:  5/5 bilateral grip strength 5/5 strength dorsiflexion/plantarflexion b/l   SENSORY:  Normal to light touch Romberg absent  COORD: Normal finger to nose and heel to shin, no tremor, no dysmetria  STATION: normal stance, no truncal ataxia  GAIT: Normal    Psychiatric:        Behavior: Behavior normal.      Results for orders placed or performed in visit on 02/28/23  POCT HgB A1C  Result Value Ref Range   Hemoglobin A1C 6.1 (A) 4.0 - 5.6 %   HbA1c POC (<> result, manual entry)     HbA1c, POC (prediabetic range)     HbA1c, POC (controlled diabetic range)         Assessment & Plan:   1. Upper respiratory tract infection, unspecified type Likely viral, would expect it to be self-limiting and for her to gradually improve management would be supportive and symptomatic it likely is also causing some of her headache symptoms  2. Lymphadenopathy Suspect they are reactive, reassured patient, would also expect these to resolve in a few weeks patient was advised to seek follow-up if any lymph nodes remained enlarged or tender for more than 3 to 4 weeks  3. Acute nonintractable headache, unspecified headache type Reviewed over-the-counter medications that the patient can try encouraged her to try Excedrin also to try to avoid daily and frequent dosing of headache medication to avoid medication overuse headaches, Imitrex prescribed for her to  try - SUMAtriptan (IMITREX) 50 MG tablet; Take 0.5-1 tablets (25-50 mg total) by mouth every 2 (two) hours as needed for migraine (max 200 mg in 24 hours). May repeat in 2 hours if headache persists or recurs.  Dispense: 10 tablet; Refill: 0  4. History of migraine No migraines for several years this may be a slight episode which is likely due to acute viral illness - SUMAtriptan (IMITREX) 50 MG tablet; Take 0.5-1 tablets (25-50 mg total) by mouth every 2 (two) hours as needed for migraine (max 200 mg in 24 hours). May repeat in 2 hours if headache persists or recurs.  Dispense: 10 tablet; Refill: 0      Danelle Berry, PA-C 04/04/23 1:29 PM

## 2023-05-14 IMAGING — MG MM DIGITAL SCREENING BILAT W/ TOMO AND CAD
8 series · 8 of 24 positions shown · non-contrast
Comparison: Previous exam(s).

CLINICAL DATA: Screening.

EXAM:
DIGITAL SCREENING BILATERAL MAMMOGRAM WITH TOMOSYNTHESIS AND CAD
TECHNIQUE: Bilateral screening digital craniocaudal and mediolateral oblique
mammograms were obtained. Bilateral screening digital breast
tomosynthesis was performed. The images were evaluated with
computer-aided detection.

[L MLO synth-2D]
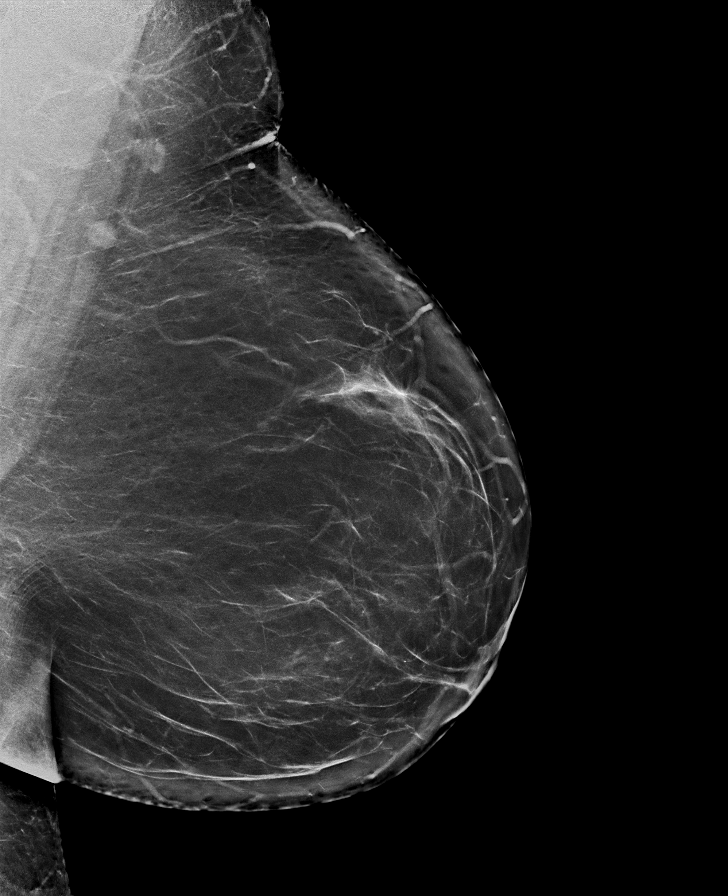

[L CC synth-2D]
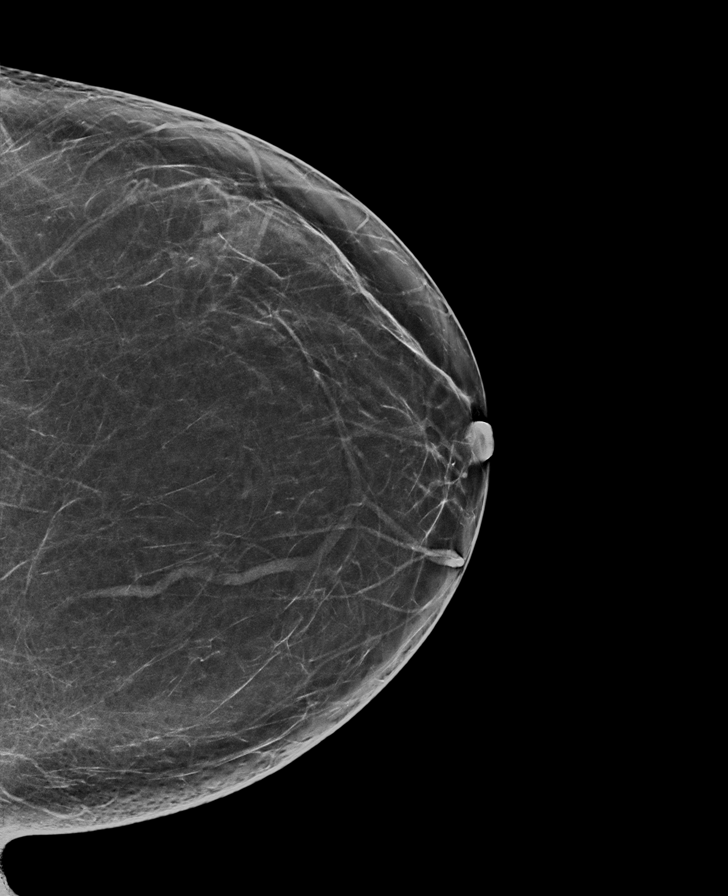

[R MLO synth-2D]
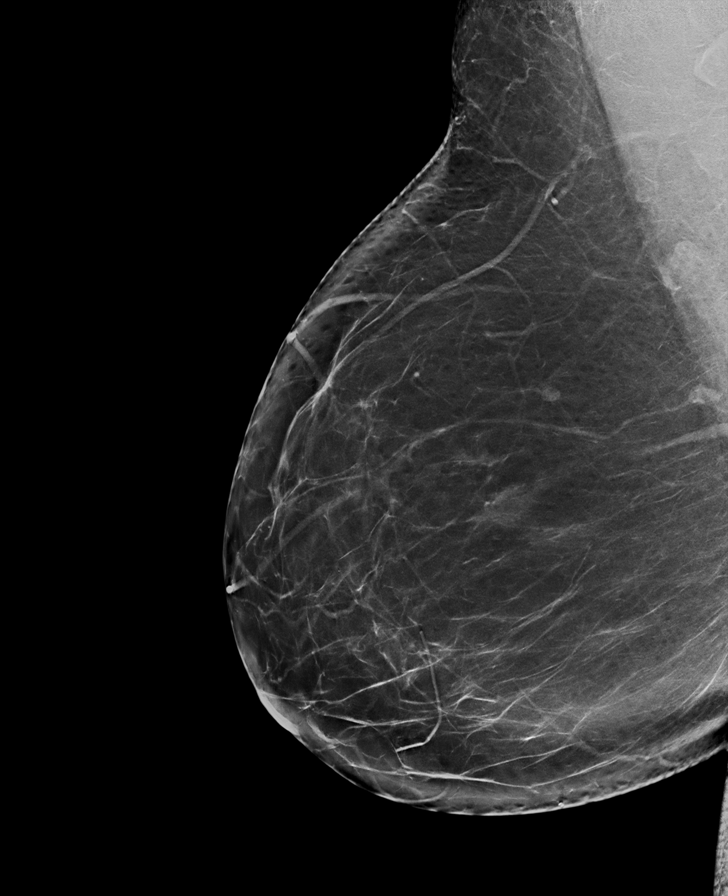

[R CC synth-2D]
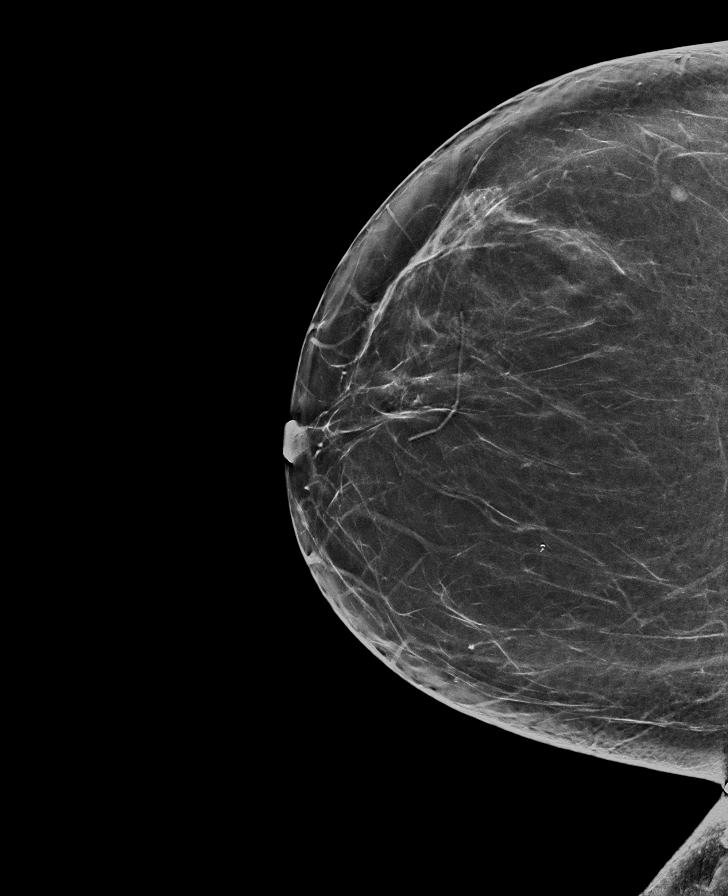

[R CC tomo · tomo slice 39/78.0]
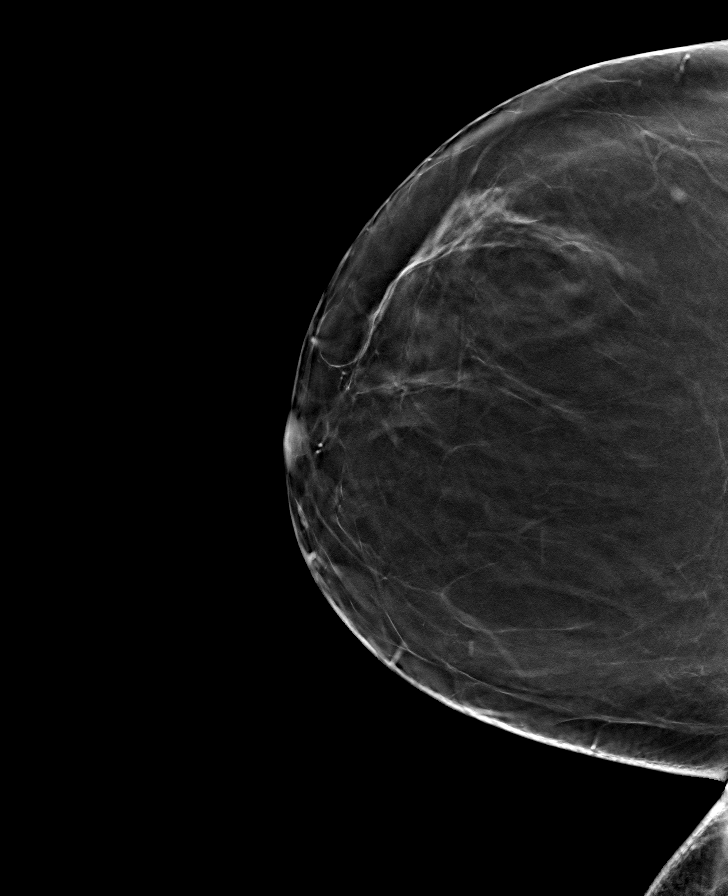

[R MLO tomo · tomo slice 46/91.0]
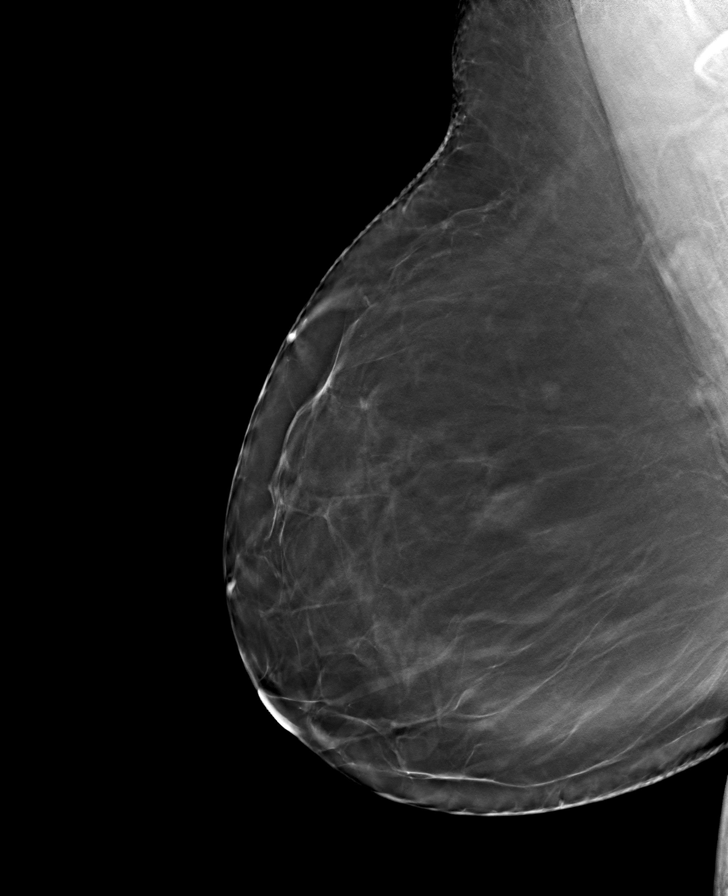

[L CC tomo · tomo slice 41/81.0]
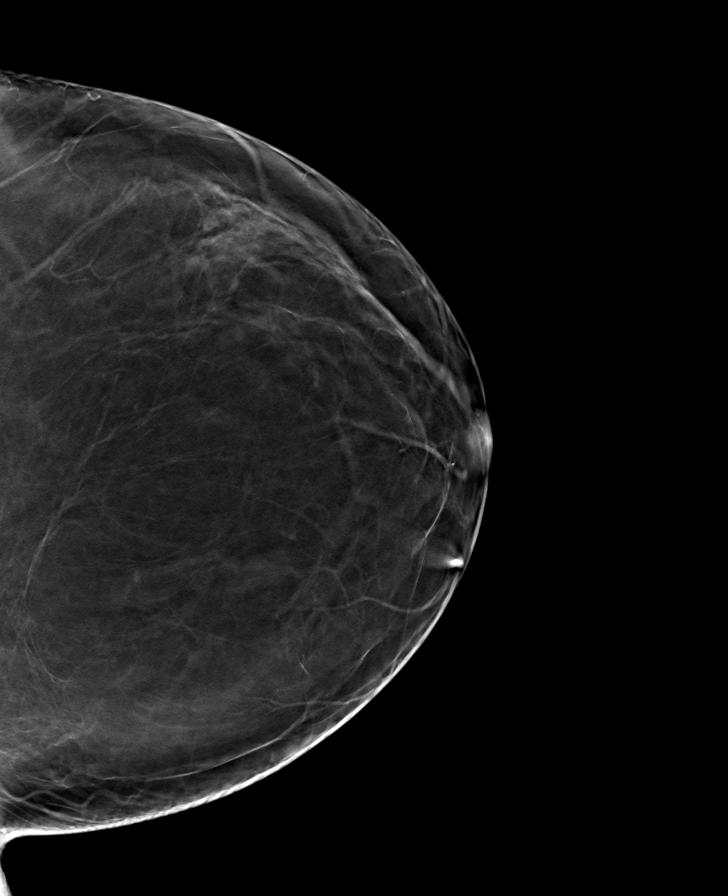

[L MLO tomo · tomo slice 49/97.0]
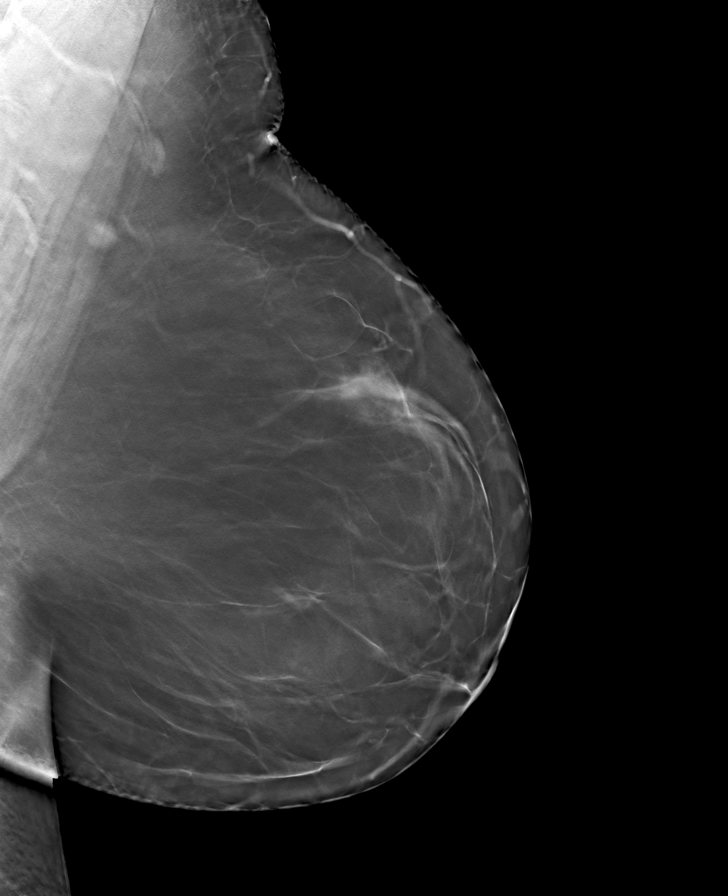

[8 of 24 positions shown; findings below may reference images not displayed]

ACR Breast Density Category b: There are scattered areas of
fibroglandular density.
FINDINGS: There are no findings suspicious for malignancy. Stable RIGHT breast
postsurgical changes.
IMPRESSION: No mammographic evidence of malignancy. A result letter of this
screening mammogram will be mailed directly to the patient.

RECOMMENDATION:
Screening mammogram in one year. (Code:ZU-E-1WA)

BI-RADS CATEGORY  2: Benign.

## 2023-05-15 IMAGING — CR DG LUMBAR SPINE COMPLETE 4+V
1 series · 6 of 6 positions shown · non-contrast
Comparison: Study of 08/25/2020.

CLINICAL DATA: Back pain.

EXAM:
LUMBAR SPINE - COMPLETE 4+ VIEW

[Series 1: dg lumbar spine complete 4 +v · 0.14mm/px · 6 of 6 slices shown]
[im 1/6]
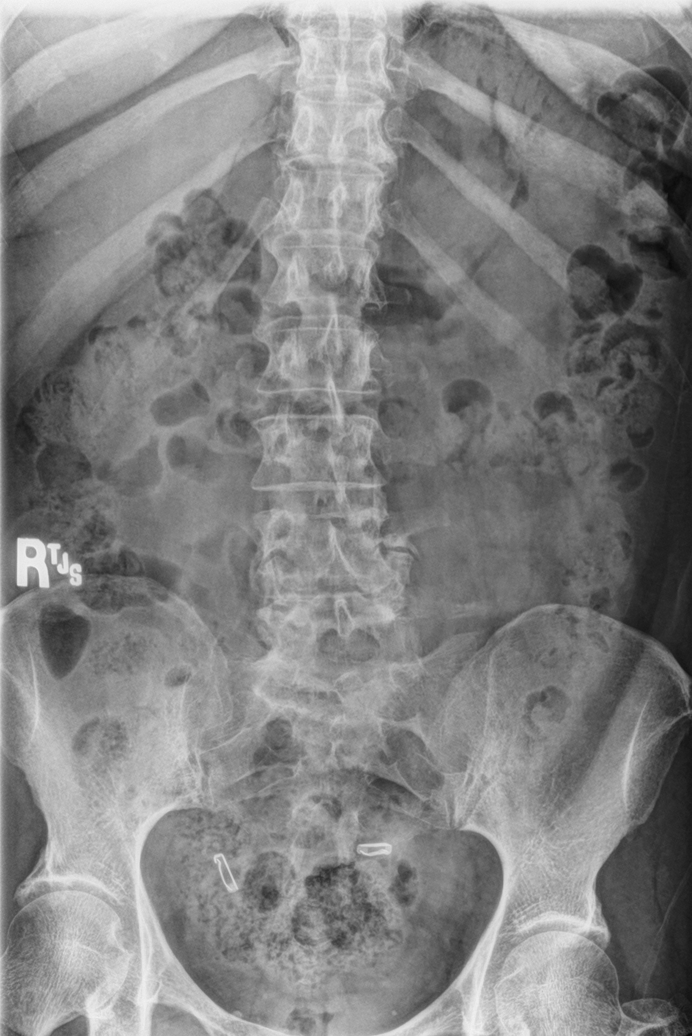
[im 2/6]
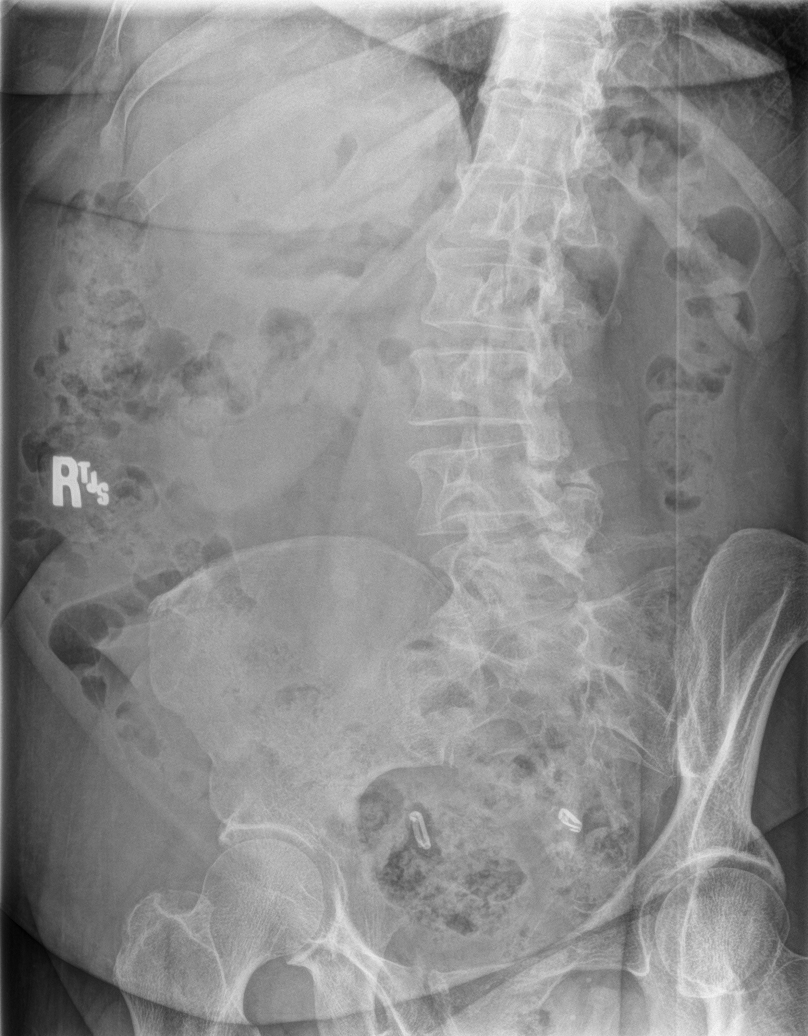
[im 3/6]
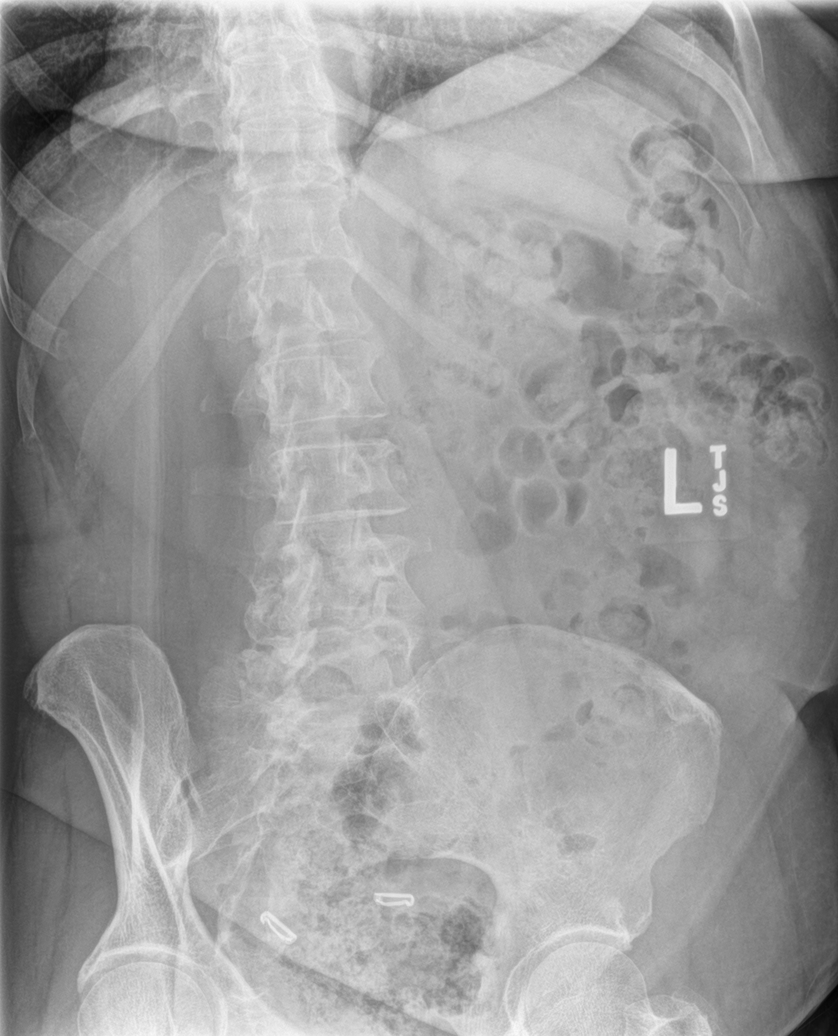
[im 4/6]
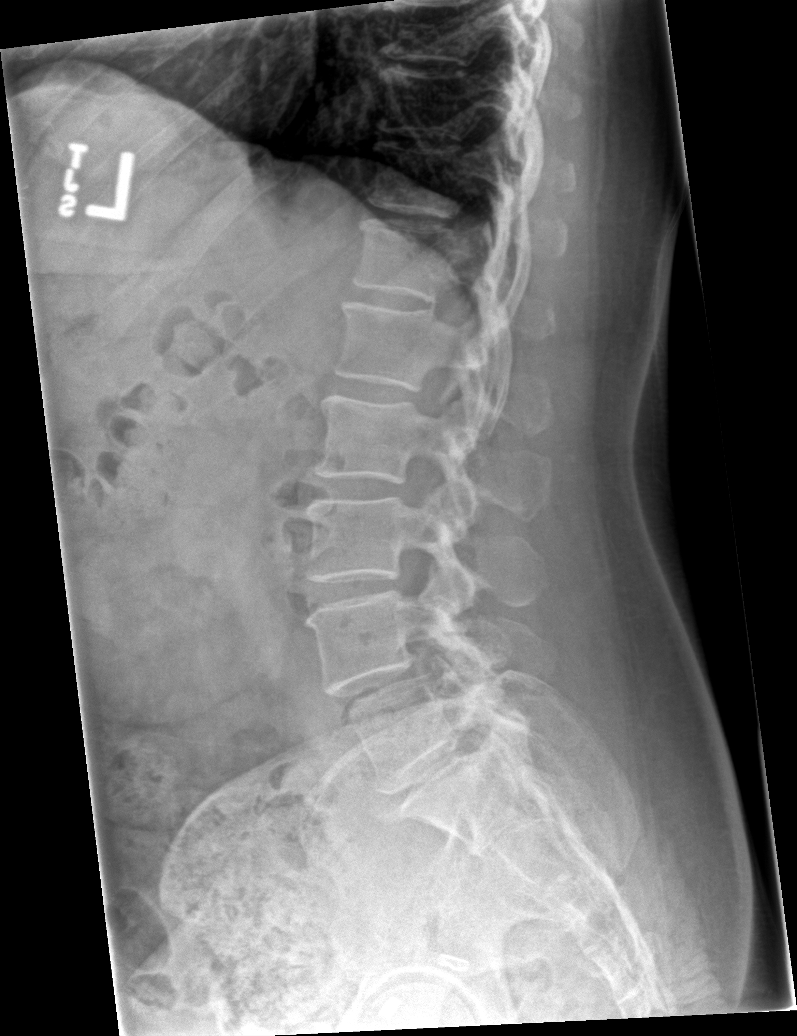
[im 5/6]
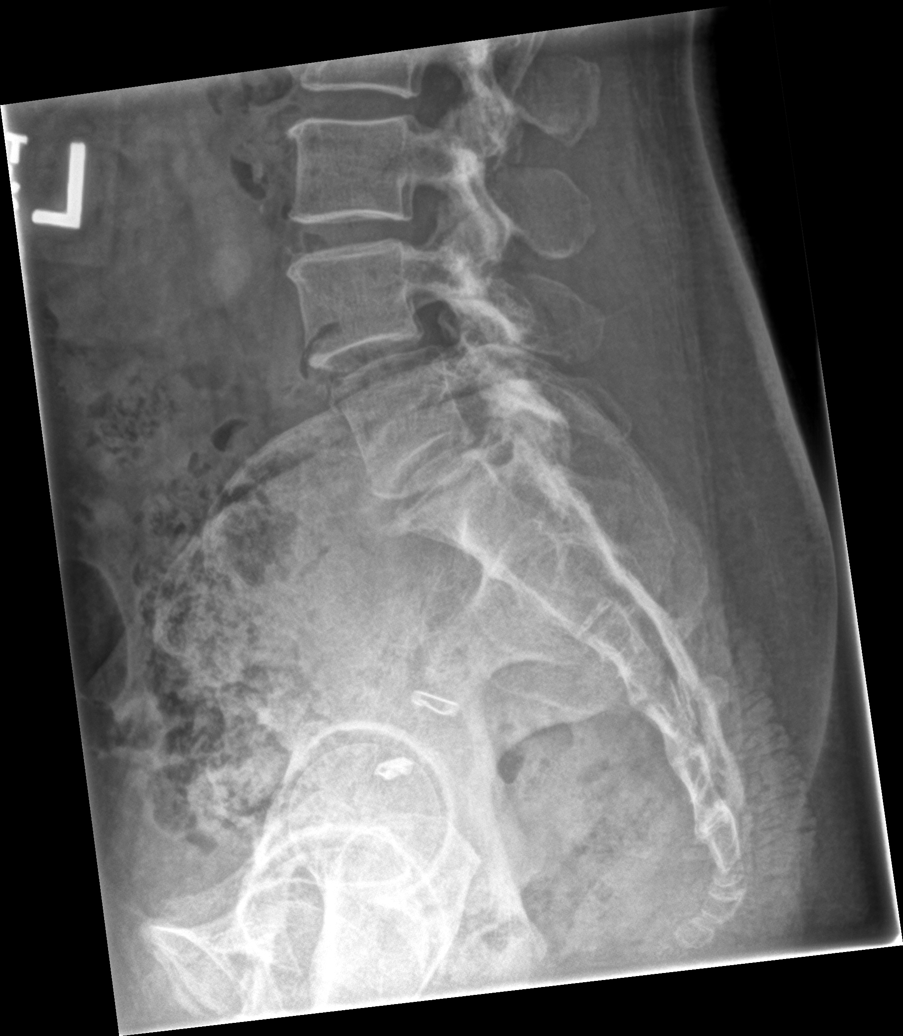
[im 6/6]
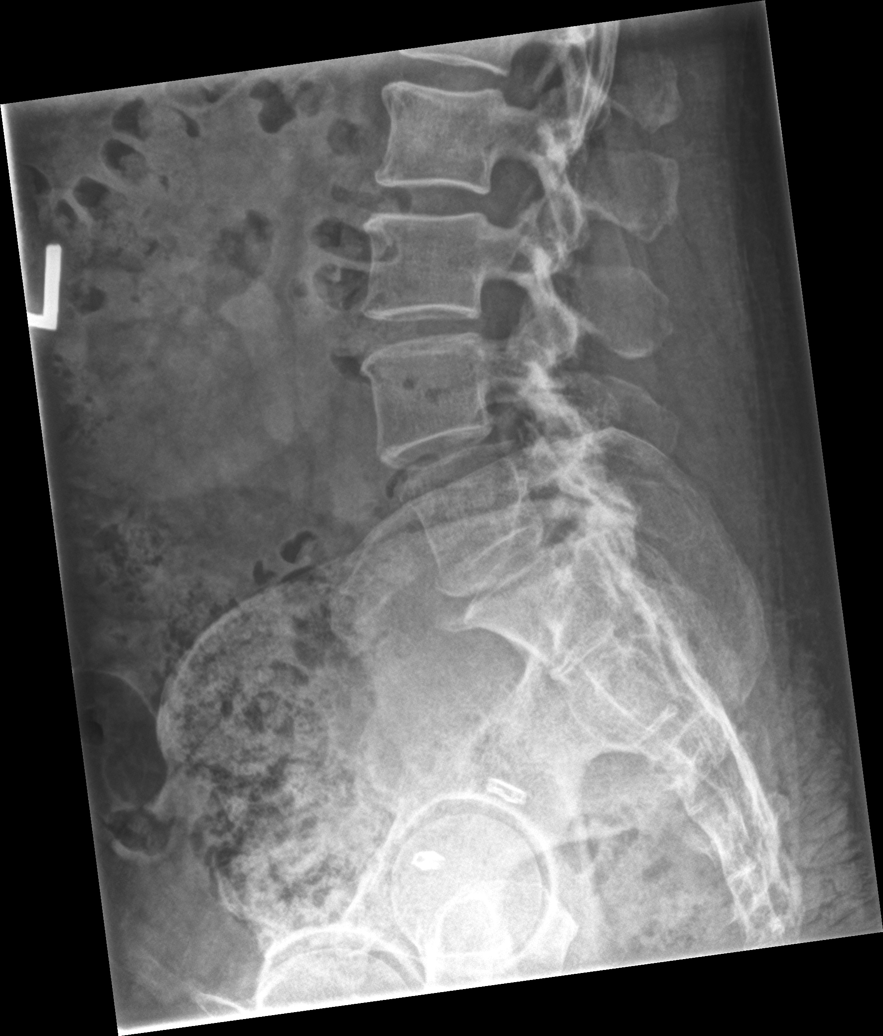

[6 of 6 positions shown; findings below may reference images not displayed]

FINDINGS: Mild osteopenia. There is preservation of the normal vertebral
heights without evidence of fractures. Grade 1 degenerative L4-5
anterolisthesis is unchanged, most likely due to prominent facet
osteophytes at this level, with slight dextroscoliosis and otherwise
no AP listhesis.

There are mild features of spondylosis. Moderate facet hypertrophy
is seen at L3-4 and L5-S1. Above L4 there normal disc heights.

At L4-5 and L5-S1, partial disc space loss is unchanged. BTL clips
in the pelvis are redemonstrated.
IMPRESSION: Osteopenia and degenerative change without evidence of fractures.
Stable exam including stable alignment findings.

## 2023-06-23 ENCOUNTER — Ambulatory Visit (INDEPENDENT_AMBULATORY_CARE_PROVIDER_SITE_OTHER): Payer: BC Managed Care – PPO | Admitting: Nurse Practitioner

## 2023-06-23 ENCOUNTER — Encounter: Payer: Self-pay | Admitting: Nurse Practitioner

## 2023-06-23 ENCOUNTER — Other Ambulatory Visit: Payer: Self-pay

## 2023-06-23 VITALS — BP 120/72 | HR 79 | Temp 98.1°F | Resp 16 | Ht 62.0 in | Wt 168.3 lb

## 2023-06-23 DIAGNOSIS — R0782 Intercostal pain: Secondary | ICD-10-CM | POA: Diagnosis not present

## 2023-06-23 DIAGNOSIS — M940 Chondrocostal junction syndrome [Tietze]: Secondary | ICD-10-CM

## 2023-06-23 DIAGNOSIS — Z23 Encounter for immunization: Secondary | ICD-10-CM | POA: Diagnosis not present

## 2023-06-23 MED ORDER — TIZANIDINE HCL 4 MG PO TABS: 4.0000 mg | ORAL_TABLET | Freq: Four times a day (QID) | ORAL | 0 refills | Status: DC | PRN

## 2023-06-23 MED ORDER — NAPROXEN 500 MG PO TABS: 500.0000 mg | ORAL_TABLET | Freq: Two times a day (BID) | ORAL | 0 refills | Status: AC

## 2023-06-23 MED ORDER — PREDNISONE 10 MG (21) PO TBPK: ORAL_TABLET | ORAL | 0 refills | Status: DC

## 2023-06-23 NOTE — Progress Notes (Signed)
BP 120/72   Pulse 79   Temp 98.1 F (36.7 C) (Oral)   Resp 16   Ht 5\' 2"  (1.575 m)   Wt 168 lb 4.8 oz (76.3 kg)   SpO2 95%   BMI 30.78 kg/m    Subjective:    Patient ID: Regina Dunlap, female    DOB: [birth year], 62 y.o.   MRN: [id number]  HPI: Regina Dunlap is a 62 y.o. female  Chief Complaint  Patient presents with   Flank Pain    Radiates for 2 days   Intercostal pain: she reports that she has this pain for a few months. She says the intensity changes.  She reports the pain starts in the epigastric area, where her ribs start and radiates to the back.  She reports the pain is dull, constant with occasional sharp pain. She denies any shortness of breath.  She denies any injury. She has tried muscle relaxer and stretches.  It helped a little bit but still having the pain.  She denies any fever. She reports she does have a history of GERD but well controlled with omeprazole.  EKG: normal sinus rhythm. Discussed treating as costochondritis. Sent in prednisone, tizanidine and naproxen.   Relevant past medical, surgical, family and social history reviewed and updated as indicated. Interim medical history since our last visit reviewed. Allergies and medications reviewed and updated.  Review of Systems  Ten systems reviewed and is negative except as mentioned in HPI       Objective:    BP 120/72   Pulse 79   Temp 98.1 F (36.7 C) (Oral)   Resp 16   Ht 5\' 2"  (1.575 m)   Wt 168 lb 4.8 oz (76.3 kg)   SpO2 95%   BMI 30.78 kg/m   Wt Readings from Last 3 Encounters:  06/23/23 168 lb 4.8 oz (76.3 kg)  04/04/23 176 lb 12.8 oz (80.2 kg)  02/28/23 173 lb 4.8 oz (78.6 kg)    Physical Exam  Constitutional: Patient appears well-developed and well-nourished. Obese  No distress.  HEENT: head atraumatic, normocephalic, pupils equal and reactive to light, neck supple Cardiovascular: Normal rate, regular rhythm and normal heart sounds.  No murmur heard. No BLE  edema. Pulmonary/Chest: Effort normal and breath sounds normal. No respiratory distress. Abdominal: Soft.  There is no tenderness. MSK: no tenderness on exam, full range of motion Psychiatric: Patient has a normal mood and affect. behavior is normal. Judgment and thought content normal.  Results for orders placed or performed in visit on 02/28/23  POCT HgB A1C  Result Value Ref Range   Hemoglobin A1C 6.1 (A) 4.0 - 5.6 %   HbA1c POC (<> result, manual entry)     HbA1c, POC (prediabetic range)     HbA1c, POC (controlled diabetic range)        Assessment & Plan:   Problem List Items Addressed This Visit   None Visit Diagnoses     Intercostal pain    -  Primary   ekg: NSR,  likely costcondritis start naproxen, prednisone and tizanidine   Relevant Medications   predniSONE (STERAPRED UNI-PAK 21 TAB) 10 MG (21) TBPK tablet   naproxen (NAPROSYN) 500 MG tablet   tiZANidine (ZANAFLEX) 4 MG tablet   Other Relevant Orders   EKG 12-Lead   Need for influenza vaccination       Relevant Orders   Flu vaccine trivalent PF, 6mos and older(Flulaval,Afluria,Fluarix,Fluzone) (Completed)   Costochondritis  ekg: NSR, likely costcondritis start naproxen, prednisone and tizanidine   Relevant Medications   predniSONE (STERAPRED UNI-PAK 21 TAB) 10 MG (21) TBPK tablet   naproxen (NAPROSYN) 500 MG tablet   tiZANidine (ZANAFLEX) 4 MG tablet        Follow up plan: Return if symptoms worsen or fail to improve.

## 2023-06-26 ENCOUNTER — Encounter: Payer: Self-pay | Admitting: Nurse Practitioner

## 2023-06-27 ENCOUNTER — Other Ambulatory Visit: Payer: Self-pay | Admitting: Nurse Practitioner

## 2023-06-27 DIAGNOSIS — R0782 Intercostal pain: Secondary | ICD-10-CM

## 2023-06-27 DIAGNOSIS — M940 Chondrocostal junction syndrome [Tietze]: Secondary | ICD-10-CM

## 2023-06-27 NOTE — Telephone Encounter (Signed)
Requested medications are due for refill today.  unsure  Requested medications are on the active medications list.  yes  Last refill. 06/23/2023 #30 0 rf  Future visit scheduled.   yes  Notes to clinic.  Refill not delegated.    Requested Prescriptions  Pending Prescriptions Disp Refills   tiZANidine (ZANAFLEX) 4 MG tablet [Pharmacy Med Name: TIZANIDINE 4MG  TABLETS] 30 tablet 0    Sig: TAKE 1 TABLET(4 MG) BY MOUTH EVERY 6 HOURS AS NEEDED FOR MUSCLE SPASMS     Not Delegated - Cardiovascular:  Alpha-2 Agonists - tizanidine Failed - 06/27/2023  3:36 AM      Failed - This refill cannot be delegated      Passed - Valid encounter within last 6 months    Recent Outpatient Visits           4 days ago Intercostal pain   [facility] Berniece Salines, FNP   2 months ago Upper respiratory tract infection, unspecified type   [facility] Danelle Berry, PA-C   3 months ago Essential hypertension   [facility] Margarita Mail, DO   8 months ago Suprapubic pain   [facility] Margarita Mail, DO   11 months ago Essential hypertension   [facility] Margarita Mail, DO       Future Appointments             In 2 months Margarita Mail, DO [facility], [facility]

## 2023-06-30 ENCOUNTER — Other Ambulatory Visit: Payer: Self-pay | Admitting: Nurse Practitioner

## 2023-06-30 DIAGNOSIS — M940 Chondrocostal junction syndrome [Tietze]: Secondary | ICD-10-CM

## 2023-06-30 DIAGNOSIS — R0782 Intercostal pain: Secondary | ICD-10-CM

## 2023-06-30 NOTE — Telephone Encounter (Signed)
Requested medications are due for refill today.  unsure  Requested medications are on the active medications list.  yes  Last refill. 06/23/2023 #20 0 rf  Future visit scheduled.   yes  Notes to clinic.  Rx written to expire 07/03/2023. Please review for refill.    Requested Prescriptions  Pending Prescriptions Disp Refills   naproxen (NAPROSYN) 500 MG tablet [Pharmacy Med Name: NAPROXEN 500MG  TABLETS] 20 tablet 0    Sig: TAKE 1 TABLET(500 MG) BY MOUTH TWICE DAILY WITH A MEAL FOR 10 DAYS     Analgesics:  NSAIDS Failed - 06/30/2023  3:38 AM      Failed - Manual Review: Labs are only required if the patient has taken medication for more than 8 weeks.      Passed - Cr in normal range and within 360 days    Creat  Date Value Ref Range Status  08/12/2021 1.30 (H) 0.50 - 1.03 mg/dL Final   Creatinine, Ser  Date Value Ref Range Status  07/28/2022 0.88 0.57 - 1.00 mg/dL Final         Passed - HGB in normal range and within 360 days    Hemoglobin  Date Value Ref Range Status  07/28/2022 13.1 11.1 - 15.9 g/dL Final         Passed - PLT in normal range and within 360 days    Platelets  Date Value Ref Range Status  07/28/2022 272 150 - 450 x10E3/uL Final         Passed - HCT in normal range and within 360 days    Hematocrit  Date Value Ref Range Status  07/28/2022 40.3 34.0 - 46.6 % Final         Passed - eGFR is 30 or above and within 360 days    GFR, Est African American  Date Value Ref Range Status  02/09/2021 103 > OR = 60 mL/min/1.29m2 Final   GFR, Est Non African American  Date Value Ref Range Status  02/09/2021 88 > OR = 60 mL/min/1.23m2 Final   eGFR  Date Value Ref Range Status  07/28/2022 76 >59 mL/min/1.73 Final         Passed - Patient is not pregnant      Passed - Valid encounter within last 12 months    Recent Outpatient Visits           1 week ago Intercostal pain   [facility] [facility] Berniece Salines, FNP   2 months ago Upper  respiratory tract infection, unspecified type   [facility] Danelle Berry, PA-C   4 months ago Essential hypertension   [facility] Margarita Mail, DO   8 months ago Suprapubic pain   [facility] Margarita Mail, DO   11 months ago Essential hypertension   [facility] Margarita Mail, DO       Future Appointments             In 2 months Margarita Mail, DO [place] [facility], [facility]

## 2023-07-13 ENCOUNTER — Other Ambulatory Visit: Payer: Self-pay | Admitting: Internal Medicine

## 2023-07-13 DIAGNOSIS — I10 Essential (primary) hypertension: Secondary | ICD-10-CM

## 2023-07-13 DIAGNOSIS — E782 Mixed hyperlipidemia: Secondary | ICD-10-CM

## 2023-07-13 NOTE — Telephone Encounter (Signed)
Requested medication (s) are due for refill today:   Yes for both  Requested medication (s) are on the active medication list:   Yes for both  Future visit scheduled:   Yes 12/5   Last ordered: Both 07/28/2022 #90, 3 refills  Unable to refill because labs are due per protocol.   Requested Prescriptions  Pending Prescriptions Disp Refills   simvastatin (ZOCOR) 20 MG tablet [Pharmacy Med Name: SIMVASTATIN 20MG  TABLETS] 90 tablet 3    Sig: TAKE 1 TABLET(20 MG) BY MOUTH DAILY AT 6 PM     Cardiovascular:  Antilipid - Statins Failed - 07/13/2023  3:39 AM      Failed - Lipid Panel in normal range within the last 12 months    Cholesterol, Total  Date Value Ref Range Status  07/28/2022 180 100 - 199 mg/dL Final   LDL Cholesterol (Calc)  Date Value Ref Range Status  08/12/2021 79 mg/dL (calc) Final    Comment:    Reference range: <100 . Desirable range <100 mg/dL for primary prevention;   <70 mg/dL for patients with CHD or diabetic patients  with > or = 2 CHD risk factors. Marland Kitchen LDL-C is now calculated using the Martin-Hopkins  calculation, which is a validated novel method providing  better accuracy than the Friedewald equation in the  estimation of LDL-C.  Horald Pollen et al. Lenox Ahr. 0981;191(47): 2061-2068  (http://education.[employer].com/faq/FAQ164)    LDL Chol Calc (NIH)  Date Value Ref Range Status  07/28/2022 98 0 - 99 mg/dL Final   HDL  Date Value Ref Range Status  07/28/2022 51 >39 mg/dL Final   Triglycerides  Date Value Ref Range Status  07/28/2022 179 (H) 0 - 149 mg/dL Final         Passed - Patient is not pregnant      Passed - Valid encounter within last 12 months    Recent Outpatient Visits           2 weeks ago Intercostal pain   [facility] Berniece Salines, FNP   3 months ago Upper respiratory tract infection, unspecified type   [facility] Danelle Berry, PA-C   4 months ago Essential hypertension    [facility] Margarita Mail, DO   9 months ago Suprapubic pain   [facility] Margarita Mail, DO   11 months ago Essential hypertension   [facility] Margarita Mail, DO       Future Appointments             In 1 month Margarita Mail, DO [place] [facility], [facility]             olmesartan-hydrochlorothiazide (BENICAR HCT) 40-25 MG tablet [Pharmacy Med Name: OLMESARTAN MEDOX/HCTZ 40-25MG  TAB] 90 tablet 3    Sig: Take 1 tablet by mouth daily.     Cardiovascular: ARB + Diuretic Combos Failed - 07/13/2023  3:39 AM      Failed - K in normal range and within 180 days    Potassium  Date Value Ref Range Status  07/28/2022 3.9 3.5 - 5.2 mmol/L Final         Failed - Na in normal range and within 180 days    Sodium  Date Value Ref Range Status  07/28/2022 143 134 - 144 mmol/L Final         Failed - Cr in normal range and within 180 days  Creat  Date Value Ref Range Status  08/12/2021 1.30 (H) 0.50 - 1.03 mg/dL Final   Creatinine, Ser  Date Value Ref Range Status  07/28/2022 0.88 0.57 - 1.00 mg/dL Final         Failed - eGFR is 10 or above and within 180 days    GFR, Est African American  Date Value Ref Range Status  02/09/2021 103 > OR = 60 mL/min/1.61m2 Final   GFR, Est Non African American  Date Value Ref Range Status  02/09/2021 88 > OR = 60 mL/min/1.72m2 Final   eGFR  Date Value Ref Range Status  07/28/2022 76 >59 mL/min/1.73 Final         Passed - Patient is not pregnant      Passed - Last BP in normal range    BP Readings from Last 1 Encounters:  06/23/23 120/72         Passed - Valid encounter within last 6 months    Recent Outpatient Visits           2 weeks ago Intercostal pain   [place] [facility] Berniece Salines, FNP   3 months ago Upper respiratory tract infection, unspecified type   [facility] Danelle Berry, PA-C   4 months ago Essential hypertension   [facility] Margarita Mail, DO   9 months ago Suprapubic pain   [facility] Margarita Mail, DO   11 months ago Essential hypertension   [facility] Margarita Mail, DO       Future Appointments             In 1 month Margarita Mail, DO [facility], [facility]

## 2023-07-14 ENCOUNTER — Other Ambulatory Visit: Payer: Self-pay | Admitting: Nurse Practitioner

## 2023-07-14 DIAGNOSIS — Z1231 Encounter for screening mammogram for malignant neoplasm of breast: Secondary | ICD-10-CM

## 2023-08-09 ENCOUNTER — Other Ambulatory Visit: Payer: Self-pay | Admitting: Internal Medicine

## 2023-08-10 NOTE — Telephone Encounter (Signed)
Requested Prescriptions  Pending Prescriptions Disp Refills   fluticasone (FLONASE) 50 MCG/ACT nasal spray [Pharmacy Med Name: FLUTICASONE NASAL SP (120) RX] 16 g 0    Sig: SHAKE LIQUID AND USE 2 SPRAYS IN EACH NOSTRIL DAILY     Ear, Nose, and Throat: Nasal Preparations - Corticosteroids Passed - 08/09/2023  3:36 AM      Passed - Valid encounter within last 12 months    Recent Outpatient Visits           1 month ago Intercostal pain   [place] [facility] Berniece Salines, FNP   4 months ago Upper respiratory tract infection, unspecified type   [facility] Danelle Berry, PA-C   5 months ago Essential hypertension   [facility] Margarita Mail, DO   10 months ago Suprapubic pain   [facility] Margarita Mail, DO   1 year ago Essential hypertension   [facility] Margarita Mail, DO       Future Appointments             In 3 weeks Margarita Mail, DO [facility]

## 2023-08-16 ENCOUNTER — Ambulatory Visit
Admission: RE | Admit: 2023-08-16 | Discharge: 2023-08-16 | Disposition: A | Discharge status: Home / Self Care | Payer: BC Managed Care – PPO | Source: Ambulatory Visit | Attending: Nurse Practitioner | Admitting: Nurse Practitioner

## 2023-08-16 DIAGNOSIS — Z1231 Encounter for screening mammogram for malignant neoplasm of breast: Secondary | ICD-10-CM | POA: Insufficient documentation

## 2023-08-29 NOTE — Progress Notes (Unsigned)
Established Patient Office Visit  Subjective   Patient ID: Regina Dunlap, female    DOB: November 12, 1960  Age: 62 y.o. MRN: [id number]  No chief complaint on file.   HPI  Patient here for follow up on chronic medical conditions. Patient having some mild mid lumbar right sided back pain, worse with activities around the house, stiff in the morning. Naproxen helps.  Hypertension/OSA: -Medications: Olmesartan-HCTZ 40-25 mg -Patient is compliant with above medications and reports no side effects. -Checking BP at home (average): doesn't check, no machine  -Denies any SOB, CP, vision changes, LE edema or symptoms of hypotension -Compliant with CPAP  HLD: -Medications: Zocor 20 mg, fish oil -Patient is compliant with above medications and reports no side effects.  -Last lipid panel: Lipid Panel     Component Value Date/Time   CHOL 180 07/28/2022 1009   TRIG 179 (H) 07/28/2022 1009   HDL 51 07/28/2022 1009   CHOLHDL 3.5 07/28/2022 1009   CHOLHDL 3.0 08/12/2021 1116   VLDL 38 (H) 04/24/2017 1528   LDLCALC 98 07/28/2022 1009   LDLCALC 79 08/12/2021 1116   LABVLDL 31 07/28/2022 1009    GERD: -Currently on Prilosec 20 mg -Symptoms include reflux, which is controlled with the medication. Denies abdominal pain, nausea, vomiting, changes in appetite.   Pre-Diabetes: -Last A1c 6/24 6.1% -Metformin 500 mg once daily - tolerating well, no side effects.  -Patient just got back from a trip to [place] and [place] where she was eating more than normal  Vitamin D Deficiency -Last checked 11/23, 31 -Currently on Vitamin D and calcium over the counter  Health Maintenance: -Blood work UTD -Mammogram 12/23 -Colonoscopy 11/21, repeat in 5 years  Review of Systems  Constitutional:  Negative for chills and fever.  Eyes:  Negative for blurred vision.  Respiratory:  Negative for cough.   Cardiovascular:  Negative for chest pain and palpitations.  Gastrointestinal:  Negative for abdominal  pain, diarrhea, heartburn, nausea and vomiting.  Musculoskeletal:  Negative for neck pain.  Neurological:  Negative for dizziness and headaches.      Objective:     There were no vitals taken for this visit. BP Readings from Last 3 Encounters:  06/23/23 120/72  04/04/23 120/82  02/28/23 116/76   Wt Readings from Last 3 Encounters:  06/23/23 168 lb 4.8 oz (76.3 kg)  04/04/23 176 lb 12.8 oz (80.2 kg)  02/28/23 173 lb 4.8 oz (78.6 kg)      Physical Exam Constitutional:      Appearance: Normal appearance.  HENT:     Head: Normocephalic and atraumatic.  Eyes:     Conjunctiva/sclera: Conjunctivae normal.  Cardiovascular:     Rate and Rhythm: Normal rate and regular rhythm.  Pulmonary:     Effort: Pulmonary effort is normal.     Breath sounds: Normal breath sounds.  Musculoskeletal:     Right lower leg: No edema.     Left lower leg: No edema.  Skin:    General: Skin is warm and dry.  Neurological:     General: No focal deficit present.     Mental Status: She is alert. Mental status is at baseline.  Psychiatric:        Mood and Affect: Mood normal.        Behavior: Behavior normal.      No results found for any visits on 08/31/23.  Last CBC Lab Results  Component Value Date   WBC 4.2 07/28/2022   HGB 13.1 07/28/2022  HCT 40.3 07/28/2022   MCV 88 07/28/2022   MCH 28.7 07/28/2022   RDW 13.1 07/28/2022   PLT 272 07/28/2022   Last metabolic panel Lab Results  Component Value Date   GLUCOSE 98 07/28/2022   NA 143 07/28/2022   K 3.9 07/28/2022   CL 101 07/28/2022   CO2 24 07/28/2022   BUN 13 07/28/2022   CREATININE 0.88 07/28/2022   EGFR 76 07/28/2022   CALCIUM 9.4 07/28/2022   PROT 7.2 07/28/2022   ALBUMIN 4.7 07/28/2022   LABGLOB 2.5 07/28/2022   AGRATIO 1.9 07/28/2022   BILITOT 0.3 07/28/2022   ALKPHOS 104 07/28/2022   AST 22 07/28/2022   ALT 18 07/28/2022   Last lipids Lab Results  Component Value Date   CHOL 180 07/28/2022   HDL 51  07/28/2022   LDLCALC 98 07/28/2022   TRIG 179 (H) 07/28/2022   CHOLHDL 3.5 07/28/2022   Last hemoglobin A1c Lab Results  Component Value Date   HGBA1C 6.1 (A) 02/28/2023   Last thyroid functions Lab Results  Component Value Date   TSH 3.420 04/14/2022   Last vitamin D Lab Results  Component Value Date   VD25OH 31.0 07/28/2022   Last vitamin B12 and Folate No results found for: "VITAMINB12", "FOLATE"    The 10-year ASCVD risk score (Arnett DK, et al., 2019) is: 3.8%    Assessment & Plan:   1. Essential hypertension: Stable, well controlled on Olmesartan-HCTZ 40-25 mg.   2. Hypercholesterolemia with hypertriglyceridemia: Reviewed lipid panel from November with the patient, triglycerides slightly high but now on fish oil and Zocor 20 mg.  3. Prediabetes: Despite her trip her A1c improved to 6.1%. Continue Metformin 500 mg once daily and we will recheck labs at follow up.  - POCT HgB A1C - metFORMIN (GLUCOPHAGE) 500 MG tablet; Take 1 tablet (500 mg total) by mouth daily with breakfast.  Dispense: 90 tablet; Refill: 1  4. Strain of lumbar region, initial encounter: Discussed using heating pad, gentle stretching, Naproxen PRN and I will prescribe a muscle relaxer to use as needed.  - tiZANidine (ZANAFLEX) 4 MG tablet; Take 1 tablet (4 mg total) by mouth every 6 (six) hours as needed for muscle spasms.  Dispense: 30 tablet; Refill: 0   No follow-ups on file.    Margarita Mail, DO

## 2023-08-31 ENCOUNTER — Ambulatory Visit
Admission: RE | Admit: 2023-08-31 | Discharge: 2023-08-31 | Disposition: A | Discharge status: Home / Self Care | Payer: BC Managed Care – PPO | Source: Ambulatory Visit | Attending: Internal Medicine | Admitting: Internal Medicine

## 2023-08-31 ENCOUNTER — Ambulatory Visit
Admission: RE | Admit: 2023-08-31 | Discharge: 2023-08-31 | Disposition: A | Discharge status: Home / Self Care | Payer: BC Managed Care – PPO | Attending: Internal Medicine | Admitting: Internal Medicine

## 2023-08-31 ENCOUNTER — Ambulatory Visit (INDEPENDENT_AMBULATORY_CARE_PROVIDER_SITE_OTHER): Payer: BC Managed Care – PPO | Admitting: Internal Medicine

## 2023-08-31 ENCOUNTER — Encounter: Payer: Self-pay | Admitting: Internal Medicine

## 2023-08-31 ENCOUNTER — Other Ambulatory Visit: Payer: Self-pay

## 2023-08-31 VITALS — BP 132/84 | HR 68 | Temp 97.8°F | Resp 16 | Ht 62.0 in | Wt 175.0 lb

## 2023-08-31 DIAGNOSIS — E559 Vitamin D deficiency, unspecified: Secondary | ICD-10-CM

## 2023-08-31 DIAGNOSIS — E782 Mixed hyperlipidemia: Secondary | ICD-10-CM | POA: Diagnosis not present

## 2023-08-31 DIAGNOSIS — M546 Pain in thoracic spine: Secondary | ICD-10-CM

## 2023-08-31 DIAGNOSIS — K219 Gastro-esophageal reflux disease without esophagitis: Secondary | ICD-10-CM | POA: Diagnosis not present

## 2023-08-31 DIAGNOSIS — I10 Essential (primary) hypertension: Secondary | ICD-10-CM

## 2023-08-31 DIAGNOSIS — R7303 Prediabetes: Secondary | ICD-10-CM | POA: Diagnosis not present

## 2023-08-31 DIAGNOSIS — M8588 Other specified disorders of bone density and structure, other site: Secondary | ICD-10-CM | POA: Diagnosis not present

## 2023-08-31 DIAGNOSIS — G8929 Other chronic pain: Secondary | ICD-10-CM

## 2023-08-31 DIAGNOSIS — Z13820 Encounter for screening for osteoporosis: Secondary | ICD-10-CM

## 2023-08-31 DIAGNOSIS — M47814 Spondylosis without myelopathy or radiculopathy, thoracic region: Secondary | ICD-10-CM | POA: Diagnosis not present

## 2023-08-31 DIAGNOSIS — M549 Dorsalgia, unspecified: Secondary | ICD-10-CM | POA: Diagnosis not present

## 2023-08-31 DIAGNOSIS — E2839 Other primary ovarian failure: Secondary | ICD-10-CM

## 2023-08-31 MED ORDER — OMEPRAZOLE 20 MG PO CPDR: 20.0000 mg | DELAYED_RELEASE_CAPSULE | Freq: Every day | ORAL | 1 refills | Status: DC

## 2023-08-31 MED ORDER — METFORMIN HCL 500 MG PO TABS: 500.0000 mg | ORAL_TABLET | Freq: Every day | ORAL | 1 refills | Status: DC

## 2023-08-31 MED ORDER — TIZANIDINE HCL 4 MG PO TABS: 4.0000 mg | ORAL_TABLET | Freq: Four times a day (QID) | ORAL | 0 refills | Status: AC | PRN

## 2023-08-31 NOTE — Patient Instructions (Addendum)
It was great seeing you today!  Plan discussed at today's visit: -Blood work ordered today, results will be uploaded to MyChart.  -Call insurance to ask about cost of DEXA scan or bone scan. Say that you had osteopenia (thinning of the bones) on a back x-ray back in 2022 with ongoing back pain.  -Continue Vitamin D supplements and weight bearing exercise  -Medications refilled   Follow up in: 6 months   Take care and let us know if you have any questions or concerns prior to your next visit.  Dr. Caralee Ates

## 2023-09-01 ENCOUNTER — Telehealth: Payer: Self-pay | Admitting: Internal Medicine

## 2023-09-01 LAB — COMPREHENSIVE METABOLIC PANEL
ALT: 17 [IU]/L (ref 0–32)
AST: 24 [IU]/L (ref 0–40)
Albumin: 4.4 g/dL (ref 3.8–4.9)
Alkaline Phosphatase: 106 [IU]/L (ref 44–121)
BUN/Creatinine Ratio: 18 (ref 12–28)
BUN: 16 mg/dL (ref 8–27)
Bilirubin Total: 0.4 mg/dL (ref 0.0–1.2)
CO2: 24 mmol/L (ref 20–29)
Calcium: 9.3 mg/dL (ref 8.7–10.3)
Chloride: 101 mmol/L (ref 96–106)
Creatinine, Ser: 0.91 mg/dL (ref 0.57–1.00)
Globulin, Total: 2.7 g/dL (ref 1.5–4.5)
Glucose: 90 mg/dL (ref 70–99)
Potassium: 3.5 mmol/L (ref 3.5–5.2)
Sodium: 140 mmol/L (ref 134–144)
Total Protein: 7.1 g/dL (ref 6.0–8.5)
eGFR: 72 mL/min/{1.73_m2} (ref >59)

## 2023-09-01 LAB — CBC WITH DIFFERENTIAL/PLATELET
Basophils Absolute: 0.1 10*3/uL (ref 0.0–0.2)
Basos: 1 %
EOS (ABSOLUTE): 0.2 10*3/uL (ref 0.0–0.4)
Eos: 4 %
Hematocrit: 40.2 % (ref 34.0–46.6)
Hemoglobin: 12.6 g/dL (ref 11.1–15.9)
Immature Grans (Abs): 0 10*3/uL (ref 0.0–0.1)
Immature Granulocytes: 0 %
Lymphocytes Absolute: 2.4 10*3/uL (ref 0.7–3.1)
Lymphs: 47 %
MCH: 26.1 pg — ABNORMAL LOW (ref 26.6–33.0)
MCHC: 31.3 g/dL — ABNORMAL LOW (ref 31.5–35.7)
MCV: 83 fL (ref 79–97)
Monocytes Absolute: 0.3 10*3/uL (ref 0.1–0.9)
Monocytes: 5 %
Neutrophils Absolute: 2.2 10*3/uL (ref 1.4–7.0)
Neutrophils: 43 %
Platelets: 238 10*3/uL (ref 150–450)
RBC: 4.83 x10E6/uL (ref 3.77–5.28)
RDW: 13.7 % (ref 11.7–15.4)
WBC: 5.1 10*3/uL (ref 3.4–10.8)

## 2023-09-01 LAB — LIPID PANEL
Chol/HDL Ratio: 3.3 {ratio} (ref 0.0–4.4)
Cholesterol, Total: 189 mg/dL (ref 100–199)
HDL: 58 mg/dL (ref >39)
LDL Chol Calc (NIH): 103 mg/dL — ABNORMAL HIGH (ref 0–99)
Triglycerides: 163 mg/dL — ABNORMAL HIGH (ref 0–149)
VLDL Cholesterol Cal: 28 mg/dL (ref 5–40)

## 2023-09-01 LAB — HEMOGLOBIN A1C
Est. average glucose Bld gHb Est-mCnc: 128 mg/dL
Hgb A1c MFr Bld: 6.1 % — ABNORMAL HIGH (ref 4.8–5.6)

## 2023-09-01 LAB — VITAMIN D 25 HYDROXY (VIT D DEFICIENCY, FRACTURES): Vit D, 25-Hydroxy: 28.4 ng/mL — ABNORMAL LOW (ref 30.0–100.0)

## 2023-09-01 MED ORDER — VITAMIN D (ERGOCALCIFEROL) 1.25 MG (50000 UNIT) PO CAPS: 50000.0000 [IU] | ORAL_CAPSULE | ORAL | 0 refills | Status: DC

## 2023-09-01 NOTE — Telephone Encounter (Signed)
Pt. Given lab results and instructions.Verbalizes understanding. 

## 2023-09-01 NOTE — Addendum Note (Signed)
Addended by: Margarita Mail on: 09/01/2023 07:53 AM   Modules accepted: Orders

## 2023-09-07 ENCOUNTER — Other Ambulatory Visit: Payer: Self-pay | Admitting: Internal Medicine

## 2023-09-07 NOTE — Telephone Encounter (Signed)
Requested Prescriptions  Pending Prescriptions Disp Refills   fluticasone (FLONASE) 50 MCG/ACT nasal spray [Pharmacy Med Name: FLUTICASONE NASAL SP (120) RX] 16 g 2    Sig: SHAKE LIQUID AND USE 2 SPRAYS IN EACH NOSTRIL DAILY     Ear, Nose, and Throat: Nasal Preparations - Corticosteroids Passed - 09/07/2023 10:33 AM      Passed - Valid encounter within last 12 months    Recent Outpatient Visits           1 week ago Essential hypertension   [facility] Margarita Mail, DO   2 months ago Intercostal pain   [facility] Berniece Salines, FNP   5 months ago Upper respiratory tract infection, unspecified type   [facility] Danelle Berry, PA-C   6 months ago Essential hypertension   [facility] Margarita Mail, DO   10 months ago Suprapubic pain   [facility] Margarita Mail, DO       Future Appointments             In 5 months Margarita Mail, DO [facility], [facility]

## 2023-09-18 ENCOUNTER — Ambulatory Visit
Admission: RE | Admit: 2023-09-18 | Discharge: 2023-09-18 | Disposition: A | Discharge status: Home / Self Care | Payer: BC Managed Care – PPO | Source: Ambulatory Visit | Attending: Internal Medicine | Admitting: Internal Medicine

## 2023-09-18 DIAGNOSIS — M8588 Other specified disorders of bone density and structure, other site: Secondary | ICD-10-CM | POA: Diagnosis not present

## 2023-09-18 DIAGNOSIS — E2839 Other primary ovarian failure: Secondary | ICD-10-CM | POA: Insufficient documentation

## 2023-09-18 DIAGNOSIS — M85852 Other specified disorders of bone density and structure, left thigh: Secondary | ICD-10-CM | POA: Diagnosis not present

## 2023-09-18 DIAGNOSIS — Z13820 Encounter for screening for osteoporosis: Secondary | ICD-10-CM | POA: Diagnosis not present

## 2023-09-18 DIAGNOSIS — Z780 Asymptomatic menopausal state: Secondary | ICD-10-CM | POA: Diagnosis not present

## 2023-09-20 ENCOUNTER — Other Ambulatory Visit: Payer: Self-pay | Admitting: Internal Medicine

## 2023-09-20 DIAGNOSIS — G8929 Other chronic pain: Secondary | ICD-10-CM

## 2023-09-21 NOTE — Telephone Encounter (Signed)
Requested medication (s) are due for refill today:   Provider to review  Requested medication (s) are on the active medication list:   Yes  Future visit scheduled:   Yes 02/29/2024   Last ordered: 08/31/2023 #90, 0 refills  Non delegated refill    Requested Prescriptions  Pending Prescriptions Disp Refills   tiZANidine (ZANAFLEX) 4 MG tablet [Pharmacy Med Name: TIZANIDINE 4MG  TABLETS] 90 tablet 0    Sig: TAKE 1 TABLET(4 MG) BY MOUTH EVERY 6 HOURS AS NEEDED FOR MUSCLE SPASMS     Not Delegated - Cardiovascular:  Alpha-2 Agonists - tizanidine Failed - 09/21/2023  3:36 PM      Failed - This refill cannot be delegated      Passed - Valid encounter within last 6 months    Recent Outpatient Visits           3 weeks ago Essential hypertension   [facility] Margarita Mail, DO   3 months ago Intercostal pain   [facility] Berniece Salines, FNP   5 months ago Upper respiratory tract infection, unspecified type   [facility] Danelle Berry, PA-C   6 months ago Essential hypertension   [facility] Margarita Mail, [place]   11 months ago Suprapubic pain   [facility] Margarita Mail, DO       Future Appointments             In 5 months Margarita Mail, DO [facility], [facility]

## 2023-09-25 ENCOUNTER — Other Ambulatory Visit: Payer: Self-pay | Admitting: Internal Medicine

## 2023-09-25 DIAGNOSIS — E559 Vitamin D deficiency, unspecified: Secondary | ICD-10-CM

## 2023-11-17 ENCOUNTER — Other Ambulatory Visit: Payer: Self-pay | Admitting: Internal Medicine

## 2023-11-17 DIAGNOSIS — E559 Vitamin D deficiency, unspecified: Secondary | ICD-10-CM

## 2023-11-17 NOTE — Telephone Encounter (Signed)
Requested medication (s) are due for refill today: Yes  Requested medication (s) are on the active medication list: Yes  Last refill:  09/01/23  Future visit scheduled: Yes  Notes to clinic:  Unable to refill per protocol, cannot delegate.      Requested Prescriptions  Pending Prescriptions Disp Refills   Vitamin D, Ergocalciferol, (DRISDOL) 1.25 MG (50000 UNIT) CAPS capsule [Pharmacy Med Name: VITAMIN D2 50,000IU (ERGO) CAP RX] 12 capsule 0    Sig: TAKE 1 CAPSULE BY MOUTH EVERY 7 DAYS     Endocrinology:  Vitamins - Vitamin D Supplementation 2 Failed - 11/17/2023  3:47 PM      Failed - Manual Review: Route requests for 50,000 IU strength to the provider      Failed - Vitamin D in normal range and within 360 days    Vit D, 25-Hydroxy  Date Value Ref Range Status  08/31/2023 28.4 (L) 30.0 - 100.0 ng/mL Final    Comment:    Vitamin D deficiency has been defined by the Institute of Medicine and an Endocrine Society practice guideline as a level of serum 25-OH vitamin D less than 20 ng/mL (1,2). The Endocrine Society went on to further define vitamin D insufficiency as a level between 21 and 29 ng/mL (2). 1. IOM (Institute of Medicine). 2010. Dietary reference    intakes for calcium and D. [place]: The    [employer]. 2. Holick MF, Binkley [place], Bischoff-Ferrari HA, et al.    Evaluation, treatment, and prevention of vitamin D    deficiency: an Endocrine Society clinical practice    guideline. JCEM. 2011 Jul; 96(7):1911-30.          Passed - Ca in normal range and within 360 days    Calcium  Date Value Ref Range Status  08/31/2023 9.3 8.7 - 10.3 mg/dL Final         Passed - Valid encounter within last 12 months    Recent Outpatient Visits           2 months ago Essential hypertension   [facility] Margarita Mail, DO   4 months ago Intercostal pain   [facility] Berniece Salines, FNP   7 months  ago Upper respiratory tract infection, unspecified type   [facility] Danelle Berry, PA-C   8 months ago Essential hypertension   [facility] Margarita Mail, DO   1 year ago Suprapubic pain   [facility] Margarita Mail, DO       Future Appointments             In 3 months Margarita Mail, DO [facility], [facility]

## 2023-12-10 ENCOUNTER — Other Ambulatory Visit: Payer: Self-pay | Admitting: Internal Medicine

## 2023-12-12 NOTE — Telephone Encounter (Signed)
 Requested Prescriptions  Pending Prescriptions Disp Refills   fluticasone (FLONASE) 50 MCG/ACT nasal spray [Pharmacy Med Name: FLUTICASONE NASAL SP (120) RX] 16 g 2    Sig: SHAKE LIQUID AND USE 2 SPRAYS IN EACH NOSTRIL DAILY     Ear, Nose, and Throat: Nasal Preparations - Corticosteroids Passed - 12/12/2023  8:33 AM      Passed - Valid encounter within last 12 months    Recent Outpatient Visits           3 months ago Essential hypertension   [facility] Margarita Mail, DO   5 months ago Intercostal pain   [facility] Berniece Salines, FNP   8 months ago Upper respiratory tract infection, unspecified type   [facility] Danelle Berry, PA-C   9 months ago Essential hypertension   [facility] Margarita Mail, DO   1 year ago Suprapubic pain   [facility] Margarita Mail, DO       Future Appointments             In 2 months Margarita Mail, DO [facility], [facility]

## 2023-12-14 DIAGNOSIS — H5203 Hypermetropia, bilateral: Secondary | ICD-10-CM | POA: Diagnosis not present

## 2023-12-14 DIAGNOSIS — E119 Type 2 diabetes mellitus without complications: Secondary | ICD-10-CM | POA: Diagnosis not present

## 2023-12-14 DIAGNOSIS — H35363 Drusen (degenerative) of macula, bilateral: Secondary | ICD-10-CM | POA: Diagnosis not present

## 2023-12-14 DIAGNOSIS — Z7984 Long term (current) use of oral hypoglycemic drugs: Secondary | ICD-10-CM | POA: Diagnosis not present

## 2023-12-14 LAB — HM DIABETES EYE EXAM

## 2024-01-07 ENCOUNTER — Other Ambulatory Visit: Payer: Self-pay | Admitting: Internal Medicine

## 2024-01-07 DIAGNOSIS — I10 Essential (primary) hypertension: Secondary | ICD-10-CM

## 2024-01-07 DIAGNOSIS — E782 Mixed hyperlipidemia: Secondary | ICD-10-CM

## 2024-01-08 NOTE — Telephone Encounter (Signed)
 Requested Prescriptions  Pending Prescriptions Disp Refills   simvastatin (ZOCOR) 20 MG tablet [Pharmacy Med Name: SIMVASTATIN 20MG  TABLETS] 90 tablet 0    Sig: TAKE 1 TABLET(20 MG) BY MOUTH DAILY AT 6 PM     Cardiovascular:  Antilipid - Statins Failed - 01/08/2024  5:03 PM      Failed - Valid encounter within last 12 months    Recent Outpatient Visits   None     Future Appointments             In 1 month Margarita Mail, DO [facility]            Failed - Lipid Panel in normal range within the last 12 months    Cholesterol, Total  Date Value Ref Range Status  08/31/2023 189 100 - 199 mg/dL Final   LDL Cholesterol (Calc)  Date Value Ref Range Status  08/12/2021 79 mg/dL (calc) Final    Comment:    Reference range: <100 . Desirable range <100 mg/dL for primary prevention;   <70 mg/dL for patients with CHD or diabetic patients  with > or = 2 CHD risk factors. Marland Kitchen LDL-C is now calculated using the Martin-Hopkins  calculation, which is a validated novel method providing  better accuracy than the Friedewald equation in the  estimation of LDL-C.  Horald Pollen et al. Lenox Ahr. 1610;960(45): 2061-2068  (http://education.[employer].com/faq/FAQ164)    LDL Chol Calc (NIH)  Date Value Ref Range Status  08/31/2023 103 (H) 0 - 99 mg/dL Final   HDL  Date Value Ref Range Status  08/31/2023 58 >39 mg/dL Final   Triglycerides  Date Value Ref Range Status  08/31/2023 163 (H) 0 - 149 mg/dL Final         Passed - Patient is not pregnant       olmesartan-hydrochlorothiazide (BENICAR HCT) 40-25 MG tablet [Pharmacy Med Name: OLMESARTAN MEDOX/HCTZ 40-25MG  TAB] 90 tablet 0    Sig: TAKE 1 TABLET BY MOUTH DAILY     Cardiovascular: ARB + Diuretic Combos Failed - 01/08/2024  5:03 PM      Failed - Valid encounter within last 6 months    Recent Outpatient Visits   None     Future Appointments             In 1 month Margarita Mail, DO [facility]            Passed - K in normal range and within 180 days    Potassium  Date Value Ref Range Status  08/31/2023 3.5 3.5 - 5.2 mmol/L Final         Passed - Na in normal range and within 180 days    Sodium  Date Value Ref Range Status  08/31/2023 140 134 - 144 mmol/L Final         Passed - Cr in normal range and within 180 days    Creat  Date Value Ref Range Status  08/12/2021 1.30 (H) 0.50 - 1.03 mg/dL Final   Creatinine, Ser  Date Value Ref Range Status  08/31/2023 0.91 0.57 - 1.00 mg/dL Final         Passed - eGFR is 10 or above and within 180 days    GFR, Est African American  Date Value Ref Range Status  02/09/2021 103 > OR = 60 mL/min/1.39m2 Final   GFR, Est Non African American  Date Value Ref Range Status  02/09/2021 88 > OR =  60 mL/min/1.43m2 Final   eGFR  Date Value Ref Range Status  08/31/2023 72 >59 mL/min/1.73 Final         Passed - Patient is not pregnant      Passed - Last BP in normal range    BP Readings from Last 1 Encounters:  08/31/23 132/84

## 2024-01-14 NOTE — Progress Notes (Signed)
 [facility] [address] [place], [place] [address]  Pulmonary Sleep Medicine   Office Visit Note  Patient Name: Regina Dunlap DOB: 08-30-1961 MRN [id number]    Chief Complaint: Obstructive Sleep Apnea visit  Brief History:  Regina Dunlap is seen today for an annual follow up visit for APAP@ 4-18 cmH2O. The patient has a 10 year history of sleep apnea. Patient is using PAP nightly.  The patient feels rested after sleeping with PAP.  The patient reports benefiting from PAP use. Reported sleepiness is  improved and the Epworth Sleepiness Score is 2 out of 24. The patient does not take naps. The patient complains of the following: pt is in need of supplies.  The compliance download shows 91% compliance with an average use time of 8 hours 28 minutes. The AHI is 0.5.  The patient does not complain of limb movements disrupting sleep. The patient continues to require PAP therapy in order to eliminate sleep apnea.   ROS  General: (-) fever, (-) chills, (-) night sweat Nose and Sinuses: (-) nasal stuffiness or itchiness, (-) postnasal drip, (-) nosebleeds, (-) sinus trouble. Mouth and Throat: (-) sore throat, (-) hoarseness. Neck: (-) swollen glands, (-) enlarged thyroid , (-) neck pain. Respiratory: - cough, - shortness of breath, - wheezing. Neurologic: - numbness, - tingling. Psychiatric: - anxiety, - depression   Current Medication: Outpatient Encounter Medications as of 01/15/2024  Medication Sig   fluticasone  (FLONASE ) 50 MCG/ACT nasal spray SHAKE LIQUID AND USE 2 SPRAYS IN EACH NOSTRIL DAILY   metFORMIN  (GLUCOPHAGE ) 500 MG tablet Take 1 tablet (500 mg total) by mouth daily with breakfast.   olmesartan -hydrochlorothiazide (BENICAR  HCT) 40-25 MG tablet TAKE 1 TABLET BY MOUTH DAILY   omeprazole  (PRILOSEC) 20 MG capsule Take 1 capsule (20 mg total) by mouth daily.   simvastatin  (ZOCOR ) 20 MG tablet TAKE 1 TABLET(20 MG) BY MOUTH DAILY AT 6 PM   SUMAtriptan  (IMITREX ) 50 MG  tablet Take 0.5-1 tablets (25-50 mg total) by mouth every 2 (two) hours as needed for migraine (max 200 mg in 24 hours). May repeat in 2 hours if headache persists or recurs.   tiZANidine  (ZANAFLEX ) 4 MG tablet Take 1 tablet (4 mg total) by mouth every 6 (six) hours as needed for muscle spasms.   TURMERIC PO Take by mouth daily. Reported on 03/28/2016   Vitamin D , Ergocalciferol , (DRISDOL ) 1.25 MG (50000 UNIT) CAPS capsule TAKE 1 CAPSULE BY MOUTH EVERY 7 DAYS   VITAMIN E PO Take by mouth as needed.   No facility-administered encounter medications on file as of 01/15/2024.    Surgical History: Past Surgical History:  Procedure Laterality Date   BREAST BIOPSY Right 1999   neg   BREAST BIOPSY Left 09/01/2022   US  Axilla Biopsy, ribbon clip, neg   BREAST BIOPSY Left 09/01/2022   US  LT BREAST BX W LOC DEV 1ST LESION IMG BX SPEC US  GUIDE 09/01/2022 [facility]   BREAST CYST ASPIRATION Right    neg   COLONOSCOPY WITH PROPOFOL  N/A 08/07/2020   Procedure: COLONOSCOPY WITH PROPOFOL ;  Surgeon: Marnee Sink, MD;  Location: [facility];  Service: Endoscopy;  Laterality: N/A;  Sleep apnea priority 4   TUBAL LIGATION      Medical History: Past Medical History:  Diagnosis Date   Esophageal reflux    Fatty liver 11/04/2016   Mastodynia    Other and unspecified hyperlipidemia    Overweight (BMI 25.0-29.9) 11/04/2016   Pain in thoracic spine    Prediabetes  03/28/2016   Unspecified essential hypertension    Unspecified sleep apnea    CPAP    Family History: Non contributory to the present illness  Social History: Social History   Socioeconomic History   Marital status: Married    Spouse name: Not on file   Number of children: Not on file   Years of education: Not on file   Highest education level: Not on file  Occupational History   Not on file  Tobacco Use   Smoking status: Never   Smokeless tobacco: Never  Vaping Use   Vaping status: Never Used  Substance and Sexual  Activity   Alcohol use: Yes    Comment: social   Drug use: No   Sexual activity: Yes  Other Topics Concern   Not on file  Social History Narrative   Not on file   Social Drivers of Health   Financial Resource Strain: Low Risk  (06/19/2020)   Overall Financial Resource Strain (CARDIA)    Difficulty of Paying Living Expenses: Not hard at all  Food Insecurity: No Food Insecurity (06/19/2020)   Hunger Vital Sign    Worried About Running Out of Food in the Last Year: Never true    Ran Out of Food in the Last Year: Never true  Transportation Needs: No Transportation Needs (06/19/2020)   PRAPARE - Administrator, Civil Service (Medical): No    Lack of Transportation (Non-Medical): No  Physical Activity: Insufficiently Active (06/19/2020)   Exercise Vital Sign    Days of Exercise per Week: 3 days    Minutes of Exercise per Session: 30 min  Stress: No Stress Concern Present (06/19/2020)   [employer] of Occupational Health - Occupational Stress Questionnaire    Feeling of Stress : Only a little  Social Connections: Moderately Isolated (06/19/2020)   Social Connection and Isolation Panel [NHANES]    Frequency of Communication with Friends and Family: More than three times a week    Frequency of Social Gatherings with Friends and Family: Never    Attends Religious Services: Never    Database administrator or Organizations: No    Attends Banker Meetings: Never    Marital Status: Married  Catering manager Violence: Not At Risk (06/19/2020)   Humiliation, Afraid, Rape, and Kick questionnaire    Fear of Current or Ex-Partner: No    Emotionally Abused: No    Physically Abused: No    Sexually Abused: No    Vital Signs: There were no vitals taken for this visit. There is no height or weight on file to calculate BMI.    Examination: General Appearance: The patient is well-developed, well-nourished, and in no distress. Neck Circumference: 38 cm Skin: Gross  inspection of skin unremarkable. Head: normocephalic, no gross deformities. Eyes: no gross deformities noted. ENT: ears appear grossly normal Neurologic: Alert and oriented. No involuntary movements.  STOP BANG RISK ASSESSMENT S (snore) Have you been told that you snore?     NO   T (tired) Are you often tired, fatigued, or sleepy during the day?   YES  O (obstruction) Do you stop breathing, choke, or gasp during sleep? NO   P (pressure) Do you have or are you being treated for high blood pressure? YES   B (BMI) Is your body index greater than 35 kg/m? YES   A (age) Are you 34 years old or older? YES   N (neck) Do you have a neck circumference greater than  16 inches?   NO   G (gender) Are you a female? NO   TOTAL STOP/BANG "YES" ANSWERS 4       A STOP-Bang score of 2 or less is considered low risk, and a score of 5 or more is high risk for having either moderate or severe OSA. For people who score 3 or 4, doctors may need to perform further assessment to determine how likely they are to have OSA.         EPWORTH SLEEPINESS SCALE:  Scale:  (0)= no chance of dozing; (1)= slight chance of dozing; (2)= moderate chance of dozing; (3)= high chance of dozing  Chance  Situtation    Sitting and reading: 1    Watching TV: 0    Sitting Inactive in public: 0    As a passenger in car: 1      Lying down to rest: 0    Sitting and talking: 0    Sitting quielty after lunch: 0    In a car, stopped in traffic: 0   TOTAL SCORE:   2 out of 24    SLEEP STUDIES:  PSG (03/2014) AHI 18/hr, min SpO2 83% Titration (05/2014) CPAP@ 10 cmH2O   CPAP COMPLIANCE DATA:  Date Range: 01/10/2023-01/09/2024  Average Daily Use: 8 hours 28 minutes  Median Use: 8 hours 31 minutes  Compliance for > 4 Hours: 91%  AHI: 0.5 respiratory events per hour  Days Used: 333/365 days  Mask Leak: 2.8  95th Percentile Pressure: 11.2         LABS: Recent Results (from the past 2160  hours)  HM DIABETES EYE EXAM     Status: None   Collection Time: 12/14/23 12:04 PM  Result Value Ref Range   HM Diabetic Eye Exam No Retinopathy No Retinopathy    Comment: Abstracted  by HIM    Radiology: DG Bone Density Result Date: 09/18/2023 EXAM: DUAL X-RAY ABSORPTIOMETRY (DXA) FOR BONE MINERAL DENSITY IMPRESSION: Your patient Regina Dunlap completed a BMD test on 09/18/2023 using the [employer] DXA System (software version: 14.10) manufactured by [employer]. The following summarizes the results of our evaluation. Technologist:VLM PATIENT BIOGRAPHICAL: Name: Regina, Dunlap Patient ID: [id number] Birth Date: 08-11-1961 Height: 62.0 in. Gender: Female Exam Date: 09/18/2023 Weight: 175.0 lbs. Indications: Postmenopausal, History of Fracture (Adult) Fractures: ankle Treatments: DENSITOMETRY RESULTS: Site      Region    Measured Date Measured Age WHO Classification Young Adult T-score BMD         %Change vs. Previous Significant Change (*) AP Spine L1-L4 09/18/2023 60.5 Normal -0.8 1.098 g/cm2 - - DualFemur Neck Left 09/18/2023 60.5 Osteopenia -1.9 0.776 g/cm2 - - ASSESSMENT: The BMD measured at Femur Neck Left is 0.776 g/cm2 with a T-score of -1.9. This patient is considered osteopenic according to World Health Organization [facility]) criteria. The scan quality is good. World Science writer [facility]) criteria for post-menopausal, Caucasian Women: Normal:                   T-score at or above -1 SD Osteopenia/low bone mass: T-score between -1 and -2.5 SD Osteoporosis:             T-score at or below -2.5 SD RECOMMENDATIONS: 1. All patients should optimize calcium and vitamin D  intake. 2. Consider FDA-approved medical therapies in postmenopausal women and men aged 90 years and older, based on the following: a. A hip or vertebral(clinical or morphometric) fracture b. T-score < -2.5  at the femoral neck or spine after appropriate evaluation to exclude secondary causes c. Low bone mass (T-score  between -1.0 and -2.5 at the femoral neck or spine) and a 10-year probability of a hip fracture > 3% or a 10-year probability of a major osteoporosis-related fracture > 20% based on the [place] -adapted WHO algorithm 3. Clinician judgment and/or patient preferences may indicate treatment for people with 10-year fracture probabilities above or below these levels FOLLOW-UP: People with diagnosed cases of osteoporosis or at high risk for fracture should have regular bone mineral density tests. For patients eligible for Medicare, routine testing is allowed once every 2 years. The testing frequency can be increased to one year for patients who have rapidly progressing disease, those who are receiving or discontinuing medical therapy to restore bone mass, or have additional risk factors. I have reviewed this report, and agree with the above findings. [facility] Dear Rockney Cid, Your patient Regina Dunlap completed a FRAX assessment on 09/18/2023 using the [facility] iDXA DXA System (analysis version: 14.10) manufactured by [employer]. The following summarizes the results of our evaluation. PATIENT BIOGRAPHICAL: Name: Regina, Arbaiza Patient ID: [id number] Birth Date: 05/30/1961 Height:    62.0 in. Gender:     Female    Age:        62.5       Weight:    175.0 lbs. Ethnicity:  Asian                            Exam Date: 09/18/2023 FRAX* RESULTS:  (version: 3.5) 10-year Probability of Fracture1 Major Osteoporotic Fracture2 Hip Fracture 8.4% 1.0% Population: [place]  (Asian) Risk Factors: History of Fracture (Adult) Based on Femur (Left) Neck BMD 1 -The 10-year probability of fracture may be lower than reported if the patient has received treatment. 2 -Major Osteoporotic Fracture: Clinical Spine, Forearm, Hip or Shoulder *FRAX is a Armed forces logistics/support/administrative officer of the [employer] of [employer] for Metabolic Bone Disease, a World Science writer (WHO) [employer]. ASSESSMENT: The probability of a  major osteoporotic fracture is 8.4 % within the next ten years. The probability of a hip fracture is 1.0% within the next ten years. . Electronically Signed   By: Dina  Arceo M.D.   On: 09/18/2023 13:27    No results found.  No results found.    Assessment and Plan: Patient Active Problem List   Diagnosis Date Noted   OSA on CPAP 10/25/2021   CPAP use counseling 10/25/2021   Essential hypertension 10/25/2021   Vitamin D  deficiency 05/01/2020   Hot flashes 07/15/2019   Varicose veins of leg with pain, left 04/23/2019   Hypokalemia 11/23/2017   Postmenopausal bleeding 10/17/2017   Varicose veins of both lower extremities without ulcer or inflammation 04/27/2017   Sleep apnea 04/24/2017   Encounter for screening colonoscopy 04/24/2017   Obesity (BMI 30.0-34.9) 11/04/2016   Fatty liver 11/04/2016   History of concussion 06/07/2016   MCI (mild cognitive impairment) 06/07/2016   Prediabetes 03/28/2016   Memory impairment 03/28/2016   Decreased sense of smell 03/28/2016   Hypercholesterolemia with hypertriglyceridemia 10/13/2015   Encounter for screening mammogram for malignant neoplasm of breast 10/13/2015   GERD without esophagitis 10/13/2015   Screening for STD (sexually transmitted disease) 10/13/2015   Allergic rhinitis 09/16/2015   Lipoma of arm 09/16/2015   Family history of colon cancer 09/16/2015   Hypertension goal BP (blood pressure) < 140/90  1. OSA on CPAP (Primary) The patient does tolerate PAP and reports  benefit from PAP use. The patient was reminded how to clean equipment and advised to replace supplies routinely. The patient was also counselled on weight loss. The compliance is very good. The AHI is 0.5.   OSA on cpap- controlled. Continue with excellent compliance with pap. CPAP continues to be medically necessary to treat this patient's OSA. F/u one year.    2. CPAP use counseling CPAP Counseling: had a lengthy discussion with the patient regarding the  importance of PAP therapy in management of the sleep apnea. Patient appears to understand the risk factor reduction and also understands the risks associated with untreated sleep apnea. Patient will try to make a good faith effort to remain compliant with therapy. Also instructed the patient on proper cleaning of the device including the water  must be changed daily if possible and use of distilled water  is preferred. Patient understands that the machine should be regularly cleaned with appropriate recommended cleaning solutions that do not damage the PAP machine for example given white vinegar and water  rinses. Other methods such as ozone treatment may not be as good as these simple methods to achieve cleaning.   3. Essential hypertension Controlled normally with olmesartan -hydrochlorothiazide. Patient forgot to take her medication today. She feels well. She will take it when she gets home.        General Counseling: I have discussed the findings of the evaluation and examination with Regina Dunlap.  I have also discussed any further diagnostic evaluation thatmay be needed or ordered today. Regina Dunlap verbalizes understanding of the findings of todays visit. We also reviewed her medications today and discussed drug interactions and side effects including but not limited excessive drowsiness and altered mental states. We also discussed that there is always a risk not just to her but also people around her. she has been encouraged to call the office with any questions or concerns that should arise related to todays visit.  No orders of the defined types were placed in this encounter.       I have personally obtained a history, examined the patient, evaluated laboratory and imaging results, formulated the assessment and plan and placed orders. This patient was seen today by Louann Rous, PA-C in collaboration with Dr. Cam Cava.   Cordie Deters, MD [facility] Diplomate ABMS Pulmonary Critical Care Medicine and  Sleep Medicine

## 2024-01-15 ENCOUNTER — Ambulatory Visit (INDEPENDENT_AMBULATORY_CARE_PROVIDER_SITE_OTHER): Admitting: Internal Medicine

## 2024-01-15 VITALS — BP 143/100 | HR 77 | Resp 16 | Ht 62.0 in | Wt 178.0 lb

## 2024-01-15 DIAGNOSIS — Z7189 Other specified counseling: Secondary | ICD-10-CM

## 2024-01-15 DIAGNOSIS — I10 Essential (primary) hypertension: Secondary | ICD-10-CM

## 2024-01-15 DIAGNOSIS — G4733 Obstructive sleep apnea (adult) (pediatric): Secondary | ICD-10-CM | POA: Diagnosis not present

## 2024-01-15 NOTE — Patient Instructions (Signed)

## 2024-02-09 ENCOUNTER — Ambulatory Visit: Attending: Medical | Admitting: Medical

## 2024-02-09 ENCOUNTER — Encounter: Payer: Self-pay | Admitting: Medical

## 2024-02-09 VITALS — BP 124/83 | HR 79 | Resp 16 | Ht 62.0 in | Wt 175.0 lb

## 2024-02-09 DIAGNOSIS — R072 Precordial pain: Secondary | ICD-10-CM

## 2024-02-09 DIAGNOSIS — E782 Mixed hyperlipidemia: Secondary | ICD-10-CM

## 2024-02-09 DIAGNOSIS — I10 Essential (primary) hypertension: Secondary | ICD-10-CM

## 2024-02-09 MED ORDER — METOPROLOL TARTRATE 100 MG PO TABS: ORAL_TABLET | ORAL | 0 refills | Status: DC

## 2024-02-09 NOTE — Progress Notes (Signed)
 Cardiology Office Note:  .   Date:  02/09/2024  ID:  Regina Dunlap, DOB 03/18/1961, MRN [id number] PCP: Rockney Cid, DO  [facility] Providers Cardiologist:  Antionette Kirks, MD     History of Present Illness: .   Regina Dunlap is a 63 y.o. female with a hx of HTN, HLD, hepatic steatosis, obesity, sleep apnea on CPAP, GERD, exertional dyspnea who presents for overdue follow-up.    Echo in 2015 showed normal EF. Treadmill test was normal. She underwent MPI for exertional dyspnea that showed no evidence of ischemia. Repeat echo 2021 showed normal LCSF, G1DD, and no significant valvular abnormalities.    Patient was seen 12/20/2021 reporting exertional dyspnea.  Echo, labs, CPX was ordered.  Echo showed normal LVEF with grade 1 diastolic dysfunction and mild valvular disease.  CPX showed normal functional capacity with no EKG changes, exercise limitations were from general deconditioning.  The patient was last seen 07/2022 and was overall stable from a cardiac perspective.   Today, the patient has overall been doing well. She has occasional dull breast pain. She had a biopsy on the side, and this was benign. The chest pain has been since the biopsy. It's worse with exertion. It's not constant. She denies shortness of breath, lower leg edema, lightheadedness, dizziness, palpitations.    Studies Reviewed: Aaron Aas   EKG Interpretation Date/Time:  Friday Feb 09 2024 09:29:21 EDT Ventricular Rate:  77 PR Interval:  164 QRS Duration:  78 QT Interval:  408 QTC Calculation: 461 R Axis:   2  Text Interpretation: Normal sinus rhythm Cannot rule out Anterior infarct , age undetermined No previous ECGs available Confirmed by Gennaro Khat, Tosh Glaze ([id number]) on 02/09/2024 9:32:49 AM    Cardiopulmonary exercise test 02/04/22 Interpretation   Notes: Patient gave a very good effort. Pulse-oximetry remained 95-98% for the duration of exercise (manually monitored). There were no sustained  arrhythmias or ST-T changes. BP response appropriate.   ECG:  Resting ECG in normal sinus bradycardia. HR response appropriate. There were no sustained arrhythmias or ST-T changes. BP response mildly hypertensive at peak exercise.   PFT:  Pre-exercise spirometry was within normal limits. The MVV was normal.   CPX:  Exercise testing with gas exchange demonstrates a normal peak VO2 of 21.6 ml/kg/min (108% of the age/gender/weight matched sedentary norms). The RER of 1.06 indicates near maximal effort. When adjusted to the patient's ideal body weight of 131.3 lb (59.6 kg) the peak VO2 is 29.3 ml/kg (ibw)/min (119% of the ibw-adjusted predicted). The VE/VCO2 slope is normal. The oxygen uptake efficiency slope (OUES) is normal. The VO2 at the ventilatory threshold was normal at 72% of the predicted peak VO2. At peak exercise, the ventilation reached 60% of the measured MVV indicating ventilatory reserve remained. The O2pulse (a surrogate for stroke volume) increased with incremental exercise, reaching peak at 12 ml/beat (120% predicted).    Conclusion: Exercise testing with gas exchange demonstrates normal functional capacity when compared to matched sedentary norms. There is no indication for cardiopulmonary abnormality and no ST-T changes were noted during exercise. Patient presents with limitations and exercise intolerance that appears related to body habitus once corrected for ideal body weight.    Test, report and preliminary impression by:  Agustina Aldrich, MS, ACSM-RCEP  02/04/2022 12:20 PM    Attending: Agree with above. Normal (to excellent) functional capacity based on CPX testing with no obvious cardiopulmonary abnormalities noted. Based on testing results, patients perceived exercise intolerance likely due primarily to her  body habitus.   Jules Oar, MD    Echo 12/2021  1. Left ventricular ejection fraction, by estimation, is 60 to 65%. The  left ventricle has normal function.  The left ventricle has no regional  wall motion abnormalities. Left ventricular diastolic parameters are  consistent with Grade I diastolic  dysfunction (impaired relaxation).   2. Right ventricular systolic function is normal. The right ventricular  size is normal. Tricuspid regurgitation signal is inadequate for assessing  PA pressure.   3. The mitral valve is normal in structure. Mild mitral valve  regurgitation. No evidence of mitral stenosis.   4. The aortic valve is normal in structure. Aortic valve regurgitation is  not visualized. No aortic stenosis is present.   5. The inferior vena cava is normal in size with greater than 50%  respiratory variability, suggesting right atrial pressure of 3 mmHg.   10:38 PM     MPI 09/2019 Narrative & Impression  There was no ST segment deviation noted during stress. No T wave inversion was noted during stress. The study is normal. This is a low risk study. The left ventricular ejection fraction is normal (69%). No evidence for ischemia    This result has not been signed. Information might be incomplete.      Echo 11/2019  1. Left ventricular ejection fraction, by estimation, is 60 to 65%. The  left ventricle has normal function. The left ventricle has no regional  wall motion abnormalities. Left ventricular diastolic parameters are  consistent with Grade I diastolic  dysfunction (impaired relaxation).   2. Right ventricular systolic function is normal. The right ventricular  size is normal.   Physical Exam:   VS:  BP 124/83 (BP Location: Left Arm, Patient Position: Sitting, Cuff Size: Large)   Pulse 79   Resp 16   Ht 5\' 2"  (1.575 m)   Wt 175 lb (79.4 kg)   SpO2 97%   BMI 32.01 kg/m    Wt Readings from Last 3 Encounters:  02/09/24 175 lb (79.4 kg)  01/15/24 178 lb (80.7 kg)  08/31/23 175 lb (79.4 kg)    GEN: Well nourished, well developed in no acute distress NECK: No JVD; No carotid bruits CARDIAC: RRR, no murmurs, rubs,  gallops RESPIRATORY:  Clear to auscultation without rales, wheezing or rhonchi  ABDOMEN: Soft, non-tender, non-distended EXTREMITIES:  No edema; No deformity   ASSESSMENT AND PLAN: .    Chest pain The patient reports dull chest pain after having breast biopsy. Says it's worse with exertion. Prior testing including CPX in 2023 and Myoview  lexiscan  in 2021 has been unremarkable. She denies DOE. Low suspicion this is cardiac pain. I will order a Cardiac CTA.  HTN BP is normal today, continue Benicar -hydrochlorothiazide 4025mg  daily.   HLD LDL 103, continue simvastatin  20mg  daily.         Dispo: Follow-up in 1 year  Signed, Lovett Coffin Rebekah Canada, PA-C

## 2024-02-09 NOTE — Patient Instructions (Addendum)
 Medication Instructions:  Your physician recommends that you continue on your current medications as directed. Please refer to the Current Medication list given to you today.   *If you need a refill on your cardiac medications before your next appointment, please call your pharmacy*  Lab Work: Your provider would like for you to have following labs drawn today (BMP).    Testing/Procedures:   Your cardiac CT will be scheduled at one of the below locations:    [facility] [address] [place], [place] [address] [phone]  OR   [facility] [address] [place], [place] [address] [phone]   If scheduled at [facility] or [facility] please arrive 15 mins early for check-in and test prep.  There is spacious parking and easy access to the radiology department from the [facility] V[facility] entrance. Please enter here and check-in with the desk attendant.    Please follow these instructions carefully (unless otherwise directed):  An IV will be required for this test and Nitroglycerin will be given.    On the Night Before the Test: Be sure to Drink plenty of water . Do not consume any caffeinated/decaffeinated beverages or chocolate 12 hours prior to your test. Do not take any antihistamines 12 hours prior to your test.   On the Day of the Test: Drink plenty of water  until 1 hour prior to the test. Do not eat any food 1 hour prior to test. You may take your regular medications prior to the test.  Take metoprolol (Lopressor) 100 mg two hours prior to test. If you take Furosemide /Hydrochlorothiazide/Spironolactone/Chlorthalidone , please HOLD on the morning of the test. Patients who wear a continuous glucose monitor MUST remove the device prior to scanning. FEMALES- please wear underwire-free bra if available, avoid dresses & tight clothing       After the Test: Drink plenty of water . After receiving IV contrast, you may experience a mild flushed feeling. This is normal. On occasion, you may experience a mild rash up to 24 hours after the test. This is not dangerous. If this occurs, you can take Benadryl 25 mg, Zyrtec, Claritin , or Allegra and increase your fluid intake. (Patients taking Tikosyn should avoid Benadryl, and may take Zyrtec, Claritin , or Allegra) If you experience trouble breathing, this can be serious. If it is severe call 911 IMMEDIATELY. If it is mild, please call our office.  We will call to schedule your test 2-4 weeks out understanding that some insurance companies will need an authorization prior to the service being performed.   For more information and frequently asked questions, please visit our website : [email or web address]  For non-scheduling related questions, please contact the cardiac imaging nurse navigator should you have any questions/concerns: Cardiac Imaging Nurse Navigators Direct Office Dial: [phone]   For scheduling needs, including cancellations and rescheduling, please call [place], [phone].   Follow-Up: At [facility], you and your health needs are our priority.  As part of our continuing mission to provide you with exceptional heart care, our providers are all part of one team.  This team includes your primary Cardiologist (physician) and Advanced Practice Providers or APPs (Physician Assistants and Nurse Practitioners) who all work together to provide you with the care you need, when you need it.  Your next appointment:   1 year(s)  Provider:   You may see Antionette Kirks, MD or one of the following Advanced Practice Providers on your  designated Care Team:   Laneta Pintos, NP Gildardo Labrador, PA-C Varney Gentleman, PA-C Cadence Gennaro Khat, PA-C Ronald Cockayne, NP Morey Ar, NP

## 2024-02-10 ENCOUNTER — Other Ambulatory Visit: Payer: Self-pay | Admitting: Medical

## 2024-02-10 LAB — BASIC METABOLIC PANEL WITH GFR
BUN/Creatinine Ratio: 17 (ref 12–28)
BUN: 16 mg/dL (ref 8–27)
CO2: 22 mmol/L (ref 20–29)
Calcium: 9.4 mg/dL (ref 8.7–10.3)
Chloride: 102 mmol/L (ref 96–106)
Creatinine, Ser: 0.94 mg/dL (ref 0.57–1.00)
Glucose: 104 mg/dL — ABNORMAL HIGH (ref 70–99)
Potassium: 3.6 mmol/L (ref 3.5–5.2)
Sodium: 142 mmol/L (ref 134–144)
eGFR: 69 mL/min/{1.73_m2} (ref >59)

## 2024-02-12 ENCOUNTER — Ambulatory Visit: Payer: Self-pay

## 2024-02-14 ENCOUNTER — Other Ambulatory Visit: Payer: Self-pay

## 2024-02-16 ENCOUNTER — Other Ambulatory Visit: Payer: Self-pay

## 2024-02-16 MED ORDER — METOPROLOL TARTRATE 100 MG PO TABS: ORAL_TABLET | ORAL | 0 refills | Status: AC

## 2024-02-17 ENCOUNTER — Other Ambulatory Visit: Payer: Self-pay | Admitting: Medical

## 2024-02-20 ENCOUNTER — Other Ambulatory Visit: Payer: Self-pay

## 2024-02-26 ENCOUNTER — Other Ambulatory Visit: Payer: Self-pay | Admitting: Internal Medicine

## 2024-02-26 DIAGNOSIS — R7303 Prediabetes: Secondary | ICD-10-CM

## 2024-02-26 DIAGNOSIS — K219 Gastro-esophageal reflux disease without esophagitis: Secondary | ICD-10-CM

## 2024-02-27 NOTE — Telephone Encounter (Signed)
 Requested Prescriptions  Pending Prescriptions Disp Refills   omeprazole  (PRILOSEC) 20 MG capsule [Pharmacy Med Name: OMEPRAZOLE  20MG  CAPSULES] 90 capsule 0    Sig: TAKE 1 CAPSULE(20 MG) BY MOUTH DAILY     Gastroenterology: Proton Pump Inhibitors Failed - 02/27/2024 10:11 AM      Failed - Valid encounter within last 12 months    Recent Outpatient Visits   None     Future Appointments             In 2 days Rockney Cid, DO [place] [facility], [facility]

## 2024-02-27 NOTE — Telephone Encounter (Signed)
 Requested Prescriptions  Pending Prescriptions Disp Refills   metFORMIN  (GLUCOPHAGE ) 500 MG tablet [Pharmacy Med Name: METFORMIN  500MG  TABLETS] 90 tablet 1    Sig: TAKE 1 TABLET(500 MG) BY MOUTH DAILY WITH BREAKFAST     Endocrinology:  Diabetes - Biguanides Failed - 02/27/2024 10:30 AM      Failed - B12 Level in normal range and within 720 days    No results found for: "VITAMINB12"       Failed - Valid encounter within last 6 months    Recent Outpatient Visits   None     Future Appointments             In 2 days Rockney Cid, DO [facility], [facility]            Passed - Cr in normal range and within 360 days    Creat  Date Value Ref Range Status  08/12/2021 1.30 (H) 0.50 - 1.03 mg/dL Final   Creatinine, Ser  Date Value Ref Range Status  02/09/2024 0.94 0.57 - 1.00 mg/dL Final         Passed - HBA1C is between 0 and 7.9 and within 180 days    Hgb A1c MFr Bld  Date Value Ref Range Status  08/31/2023 6.1 (H) 4.8 - 5.6 % Final    Comment:             Prediabetes: 5.7 - 6.4          Diabetes: >6.4          Glycemic control for adults with diabetes: <7.0          Passed - eGFR in normal range and within 360 days    GFR, Est African American  Date Value Ref Range Status  02/09/2021 103 > OR = 60 mL/min/1.58m2 Final   GFR, Est Non African American  Date Value Ref Range Status  02/09/2021 88 > OR = 60 mL/min/1.83m2 Final   eGFR  Date Value Ref Range Status  02/09/2024 69 >59 mL/min/1.73 Final         Passed - CBC within normal limits and completed in the last 12 months    WBC  Date Value Ref Range Status  08/31/2023 5.1 3.4 - 10.8 x10E3/uL Final  02/09/2021 6.2 3.8 - 10.8 Thousand/uL Final   RBC  Date Value Ref Range Status  08/31/2023 4.83 3.77 - 5.28 x10E6/uL Final  02/09/2021 4.45 3.80 - 5.10 Million/uL Final   Hemoglobin  Date Value Ref Range Status  08/31/2023 12.6 11.1 - 15.9 g/dL Final   Hematocrit  Date Value Ref  Range Status  08/31/2023 40.2 34.0 - 46.6 % Final   MCHC  Date Value Ref Range Status  08/31/2023 31.3 (L) 31.5 - 35.7 g/dL Final  60/45/4098 11.9 32.0 - 36.0 g/dL Final   [facility]  Date Value Ref Range Status  08/31/2023 26.1 (L) 26.6 - 33.0 pg Final  02/09/2021 28.5 27.0 - 33.0 pg Final   MCV  Date Value Ref Range Status  08/31/2023 83 79 - 97 fL Final   No results found for: "PLTCOUNTKUC", "LABPLAT", "POCPLA" RDW  Date Value Ref Range Status  08/31/2023 13.7 11.7 - 15.4 % Final

## 2024-02-29 ENCOUNTER — Encounter: Payer: Self-pay | Admitting: Internal Medicine

## 2024-02-29 ENCOUNTER — Ambulatory Visit (INDEPENDENT_AMBULATORY_CARE_PROVIDER_SITE_OTHER): Payer: Self-pay | Admitting: Internal Medicine

## 2024-02-29 ENCOUNTER — Other Ambulatory Visit: Payer: Self-pay

## 2024-02-29 VITALS — BP 120/68 | HR 77 | Temp 98.1°F | Resp 16 | Ht 62.0 in | Wt 174.1 lb

## 2024-02-29 DIAGNOSIS — Z800 Family history of malignant neoplasm of digestive organs: Secondary | ICD-10-CM | POA: Diagnosis not present

## 2024-02-29 DIAGNOSIS — Z803 Family history of malignant neoplasm of breast: Secondary | ICD-10-CM

## 2024-02-29 DIAGNOSIS — I10 Essential (primary) hypertension: Secondary | ICD-10-CM | POA: Diagnosis not present

## 2024-02-29 DIAGNOSIS — R7303 Prediabetes: Secondary | ICD-10-CM

## 2024-02-29 DIAGNOSIS — B372 Candidiasis of skin and nail: Secondary | ICD-10-CM

## 2024-02-29 LAB — POCT GLYCOSYLATED HEMOGLOBIN (HGB A1C): Hemoglobin A1C: 6.1 % — AB (ref 4.0–5.6)

## 2024-02-29 MED ORDER — NYSTATIN 100000 UNIT/GM EX POWD: 1.0000 | Freq: Three times a day (TID) | CUTANEOUS | 0 refills | Status: DC

## 2024-02-29 NOTE — Progress Notes (Signed)
 Established Patient Office Visit  Subjective   Patient ID: Regina Dunlap, female    DOB: 14-Jul-1961  Age: 63 y.o. MRN: [id number]  Chief Complaint  Patient presents with   Medical Management of Chronic Issues    6 month follow up    HPI  Patient here for follow up on chronic medical conditions.   Discussed the use of AI scribe software for clinical note transcription with the patient, who gave verbal consent to proceed.  History of Present Illness Regina Dunlap is a 63 year old female who presents for a regular follow-up visit.  She experiences occasional sharp pains in the breast area where a biopsy was previously performed. These are described as 'little sharp bands' of pain. Cardiology has a nuclear study to rule out cardiac pathology later in the month.  Her diabetes management is stable, with an A1c of 6.1 over the past three checks. She maintains her diet by avoiding sweets and choosing fruits like strawberries and blueberries.  She has a skin discoloration above the pelvic area that sometimes itches. Application of Vaseline provides some relief.  Her family history is significant for cancer. Her eldest sister was recently diagnosed with breast cancer, her younger sister has stage four colon cancer since 2010, and another sister passed away from cervical cancer. She undergoes colonoscopy screenings every five years, with the next one scheduled for next year.   Hypertension/OSA: -Medications: Olmesartan -HCTZ 40-25 mg -Patient is compliant with above medications and reports no side effects. -Checking BP at home (average): doesn't check, no machine  -Denies any SOB, CP, vision changes, LE edema or symptoms of hypotension -Compliant with CPAP  HLD: -Medications: Zocor  20 mg, fish oil -Patient is compliant with above medications and reports no side effects.  -Last lipid panel: Lipid Panel     Component Value Date/Time   CHOL 189 08/31/2023 1157   TRIG 163  (H) 08/31/2023 1157   HDL 58 08/31/2023 1157   CHOLHDL 3.3 08/31/2023 1157   CHOLHDL 3.0 08/12/2021 1116   VLDL 38 (H) 04/24/2017 1528   LDLCALC 103 (H) 08/31/2023 1157   LDLCALC 79 08/12/2021 1116   LABVLDL 28 08/31/2023 1157    GERD: -Currently on Prilosec 20 mg -Symptoms include reflux, which is controlled with the medication. Denies abdominal pain, nausea, vomiting, changes in appetite.   Pre-Diabetes: -Last A1c 6/24 6.1% -Metformin  500 mg once daily - tolerating well, no side effects.  -Continuing to work on diet and lifestyle modification  Health Maintenance: -Blood work UTD -Mammogram 11/24 Birads-1 -Colonoscopy 11/21, repeat in 5 years  Review of Systems  Constitutional:  Negative for chills and fever.  Respiratory:  Negative for cough.   Cardiovascular:  Negative for chest pain and palpitations.  Gastrointestinal:  Negative for abdominal pain.  Skin:  Positive for itching and rash.  Neurological:  Negative for dizziness and headaches.      Objective:     BP 120/68 (Cuff Size: Large)   Pulse 77   Temp 98.1 F (36.7 C) (Oral)   Resp 16   Ht 5\' 2"  (1.575 m)   Wt 174 lb 1.6 oz (79 kg)   SpO2 99%   BMI 31.84 kg/m  BP Readings from Last 3 Encounters:  02/29/24 120/68  02/09/24 124/83  01/15/24 (!) 143/100   Wt Readings from Last 3 Encounters:  02/29/24 174 lb 1.6 oz (79 kg)  02/09/24 175 lb (79.4 kg)  01/15/24 178 lb (80.7 kg)  Physical Exam Constitutional:      Appearance: Normal appearance.  HENT:     Head: Normocephalic and atraumatic.  Eyes:     Conjunctiva/sclera: Conjunctivae normal.  Cardiovascular:     Rate and Rhythm: Normal rate and regular rhythm.  Pulmonary:     Effort: Pulmonary effort is normal.     Breath sounds: Normal breath sounds.  Musculoskeletal:     Thoracic back: Spasms and tenderness present. No bony tenderness. Normal range of motion.     Comments: Right sided thoracic paraspinal hypertonicity  Skin:     General: Skin is warm and dry.  Neurological:     General: No focal deficit present.     Mental Status: She is alert. Mental status is at baseline.  Psychiatric:        Mood and Affect: Mood normal.        Behavior: Behavior normal.      No results found for any visits on 02/29/24.  Last CBC Lab Results  Component Value Date   WBC 5.1 08/31/2023   HGB 12.6 08/31/2023   HCT 40.2 08/31/2023   MCV 83 08/31/2023   MCH 26.1 (L) 08/31/2023   RDW 13.7 08/31/2023   PLT 238 08/31/2023   Last metabolic panel Lab Results  Component Value Date   GLUCOSE 104 (H) 02/09/2024   NA 142 02/09/2024   K 3.6 02/09/2024   CL 102 02/09/2024   CO2 22 02/09/2024   BUN 16 02/09/2024   CREATININE 0.94 02/09/2024   EGFR 69 02/09/2024   CALCIUM 9.4 02/09/2024   PROT 7.1 08/31/2023   ALBUMIN 4.4 08/31/2023   LABGLOB 2.7 08/31/2023   AGRATIO 1.9 07/28/2022   BILITOT 0.4 08/31/2023   ALKPHOS 106 08/31/2023   AST 24 08/31/2023   ALT 17 08/31/2023   Last lipids Lab Results  Component Value Date   CHOL 189 08/31/2023   HDL 58 08/31/2023   LDLCALC 103 (H) 08/31/2023   TRIG 163 (H) 08/31/2023   CHOLHDL 3.3 08/31/2023   Last hemoglobin A1c Lab Results  Component Value Date   HGBA1C 6.1 (H) 08/31/2023   Last thyroid  functions Lab Results  Component Value Date   TSH 3.420 04/14/2022   Last vitamin D  Lab Results  Component Value Date   VD25OH 28.4 (L) 08/31/2023   Last vitamin B12 and Folate No results found for: "VITAMINB12", "FOLATE"    The 10-year ASCVD risk score (Arnett DK, et al., 2019) is: 3.6%    Assessment & Plan:   Assessment & Plan Intertrigo Rash above pelvic area likely intertrigo, worsened by sweating and obesity. Managed with Vaseline, not ideal. Emphasized keeping area dry. - Prescribe Nystatin powder to apply three times a day. - Advise to keep area dry, pat dry after showering, avoid Vaseline.  Pre-Diabetes Well-controlled with HbA1c of 6.1% for last  three checks. Managed with lifestyle modifications. Patient motivated and understands importance of HbA1c monitoring. - Check HbA1c today via finger stick. - Continue current lifestyle modifications.  Family History of Cancer Significant family history of cancer. Patient concerned about genetic risk and interested in genetic counseling and testing, including BRCA. - Refer to genetic counselor for evaluation and possible genetic testing, including BRCA.  HTN Stable. - Continue current medications.   General Health Maintenance Up to date with colonoscopy screenings. Last Pap smear negative for HPV and abnormal cells. - Schedule Pap smear during next physical in December. - Continue colonoscopy screenings as scheduled.  - POCT HgB A1C - Ambulatory  referral to Genetics - nystatin (MYCOSTATIN/NYSTOP) powder; Apply 1 Application topically 3 (three) times daily.  Dispense: 15 g; Refill: 0    Return in about 6 months (around 08/30/2024) for physical w/Pap.    Rockney Cid, DO

## 2024-03-05 ENCOUNTER — Other Ambulatory Visit: Payer: Self-pay | Admitting: Internal Medicine

## 2024-03-05 NOTE — Telephone Encounter (Signed)
 Requested Prescriptions  Pending Prescriptions Disp Refills   fluticasone  (FLONASE ) 50 MCG/ACT nasal spray [Pharmacy Med Name: FLUTICASONE  NASAL SP (120) RX] 16 g 2    Sig: SHAKE LIQUID AND USE 2 SPRAYS IN EACH NOSTRIL DAILY     Ear, Nose, and Throat: Nasal Preparations - Corticosteroids Passed - 03/05/2024  5:40 PM      Passed - Valid encounter within last 12 months    Recent Outpatient Visits           5 days ago Prediabetes   [facility] Rockney Cid, DO       Future Appointments             In 6 months Rockney Cid, DO [facility], [facility]

## 2024-03-21 ENCOUNTER — Encounter (HOSPITAL_COMMUNITY): Payer: Self-pay

## 2024-03-25 ENCOUNTER — Ambulatory Visit
Admission: RE | Admit: 2024-03-25 | Discharge: 2024-03-25 | Disposition: A | Discharge status: Home / Self Care | Source: Ambulatory Visit | Attending: Medical | Admitting: Medical

## 2024-03-25 DIAGNOSIS — R072 Precordial pain: Secondary | ICD-10-CM | POA: Insufficient documentation

## 2024-03-25 MED ORDER — METOPROLOL TARTRATE 5 MG/5ML IV SOLN
10.0000 mg | Freq: Once | INTRAVENOUS | Status: DC | PRN
  Filled 2024-03-25: qty 10

## 2024-03-25 MED ORDER — DILTIAZEM HCL 25 MG/5ML IV SOLN
10.0000 mg | INTRAVENOUS | Status: DC | PRN
  Filled 2024-03-25: qty 5

## 2024-03-25 MED ORDER — NITROGLYCERIN 0.4 MG SL SUBL
0.8000 mg | SUBLINGUAL_TABLET | Freq: Once | SUBLINGUAL | Status: AC
  Administered 2024-03-25: 0.8 mg via SUBLINGUAL
  Filled 2024-03-25: qty 25

## 2024-03-25 MED ORDER — IOHEXOL 350 MG/ML SOLN
80.0000 mL | Freq: Once | INTRAVENOUS | Status: AC | PRN
  Administered 2024-03-25: 80 mL via INTRAVENOUS

## 2024-03-25 NOTE — Progress Notes (Signed)
 Patient tolerated procedure well. Ambulate w/o difficulty. Denies any lightheadedness or being dizzy. Pt denies any pain at this time. Sitting in chair. Pt is encouraged to drink additional water throughout the day and reason explained to patient. Patient verbalized understanding and all questions answered. ABC intact. No further needs at this time. Discharge from procedure area w/o issues.

## 2024-04-03 ENCOUNTER — Other Ambulatory Visit

## 2024-04-03 ENCOUNTER — Encounter: Admitting: Licensed Clinical Social Worker

## 2024-04-08 ENCOUNTER — Other Ambulatory Visit: Payer: Self-pay | Admitting: Internal Medicine

## 2024-04-08 DIAGNOSIS — I10 Essential (primary) hypertension: Secondary | ICD-10-CM

## 2024-04-08 DIAGNOSIS — E782 Mixed hyperlipidemia: Secondary | ICD-10-CM

## 2024-04-09 NOTE — Telephone Encounter (Signed)
 Requested Prescriptions  Pending Prescriptions Disp Refills   simvastatin  (ZOCOR ) 20 MG tablet [Pharmacy Med Name: SIMVASTATIN  20MG  TABLETS] 90 tablet 1    Sig: TAKE 1 TABLET(20 MG) BY MOUTH DAILY AT 6 PM     Cardiovascular:  Antilipid - Statins Failed - 04/09/2024  2:46 PM      Failed - Lipid Panel in normal range within the last 12 months    Cholesterol, Total  Date Value Ref Range Status  08/31/2023 189 100 - 199 mg/dL Final   LDL Cholesterol (Calc)  Date Value Ref Range Status  08/12/2021 79 mg/dL (calc) Final    Comment:    Reference range: <100 . Desirable range <100 mg/dL for primary prevention;   <70 mg/dL for patients with CHD or diabetic patients  with > or = 2 CHD risk factors. SABRA LDL-C is now calculated using the Martin-Hopkins  calculation, which is a validated novel method providing  better accuracy than the Friedewald equation in the  estimation of LDL-C.  Regina Dunlap et al. SANDREA. 7986;689(80): 2061-2068  (http://education.[employer].com/faq/FAQ164)    LDL Chol Calc (NIH)  Date Value Ref Range Status  08/31/2023 103 (H) 0 - 99 mg/dL Final   HDL  Date Value Ref Range Status  08/31/2023 58 >39 mg/dL Final   Triglycerides  Date Value Ref Range Status  08/31/2023 163 (H) 0 - 149 mg/dL Final         Passed - Patient is not pregnant      Passed - Valid encounter within last 12 months    Recent Outpatient Visits           1 month ago Prediabetes   [facility] Bernardo Fend, DO       Future Appointments             In 4 months Bernardo Fend, DO [facility], [facility]             olmesartan -hydrochlorothiazide (BENICAR  HCT) 40-25 MG tablet [Pharmacy Med Name: OLMESARTAN  MEDOX/HCTZ 40-25MG  TAB] 90 tablet 1    Sig: TAKE 1 TABLET BY MOUTH DAILY     Cardiovascular: ARB + Diuretic Combos Passed - 04/09/2024  2:46 PM      Passed - K in normal range and within 180 days    Potassium  Date  Value Ref Range Status  02/09/2024 3.6 3.5 - 5.2 mmol/L Final         Passed - Na in normal range and within 180 days    Sodium  Date Value Ref Range Status  02/09/2024 142 134 - 144 mmol/L Final         Passed - Cr in normal range and within 180 days    Creat  Date Value Ref Range Status  08/12/2021 1.30 (H) 0.50 - 1.03 mg/dL Final   Creatinine, Ser  Date Value Ref Range Status  02/09/2024 0.94 0.57 - 1.00 mg/dL Final         Passed - eGFR is 10 or above and within 180 days    GFR, Est African American  Date Value Ref Range Status  02/09/2021 103 > OR = 60 mL/min/1.70m2 Final   GFR, Est Non African American  Date Value Ref Range Status  02/09/2021 88 > OR = 60 mL/min/1.52m2 Final   eGFR  Date Value Ref Range Status  02/09/2024 69 >59 mL/min/1.73 Final         Passed - Patient is not pregnant  Passed - Last BP in normal range    BP Readings from Last 1 Encounters:  03/25/24 101/62         Passed - Valid encounter within last 6 months    Recent Outpatient Visits           1 month ago Prediabetes   [facility] Bernardo Fend, DO       Future Appointments             In 4 months Bernardo Fend, DO [facility], [facility]

## 2024-04-16 ENCOUNTER — Inpatient Hospital Stay: Attending: Oncology | Admitting: Licensed Clinical Social Worker

## 2024-04-16 ENCOUNTER — Other Ambulatory Visit: Payer: Self-pay | Admitting: Licensed Clinical Social Worker

## 2024-04-16 ENCOUNTER — Inpatient Hospital Stay

## 2024-04-16 DIAGNOSIS — Z803 Family history of malignant neoplasm of breast: Secondary | ICD-10-CM | POA: Insufficient documentation

## 2024-04-16 DIAGNOSIS — Z800 Family history of malignant neoplasm of digestive organs: Secondary | ICD-10-CM | POA: Insufficient documentation

## 2024-04-16 DIAGNOSIS — Z1379 Encounter for other screening for genetic and chromosomal anomalies: Secondary | ICD-10-CM | POA: Insufficient documentation

## 2024-04-16 LAB — GENETIC SCREENING ORDER

## 2024-04-16 NOTE — Progress Notes (Signed)
 REFERRING PROVIDER: Bernardo Fend, DO [address] [place],  [place] [address]  PRIMARY PROVIDER:  Bernardo Fend, DO  PRIMARY REASON FOR VISIT:  1. Family history of colon cancer   2. Family history of breast cancer      HISTORY OF PRESENT ILLNESS:   Ms. Folmer, a 63 y.o. female, was seen for a [place] cancer genetics consultation at the request of Dr. Bernardo due to a family history of cancer.  Ms. Guimond presents to clinic today to discuss the possibility of a hereditary predisposition to cancer, genetic testing, and to further clarify her future cancer risks, as well as potential cancer risks for family members.    CANCER HISTORY:  Ms. Phillis is a 63 y.o. female with no personal history of cancer.    RISK FACTORS:  Menarche was at age 34-12.  First live birth at age 78.  Ovaries intact: yes.  Hysterectomy: no.  Menopausal status: postmenopausal.  Colonoscopy: yes; normal. Mammogram within the last year: yes. Up to date with pelvic exams: yes.  Past Medical History:  Diagnosis Date   Esophageal reflux    Fatty liver 11/04/2016   Mastodynia    Other and unspecified hyperlipidemia    Overweight (BMI 25.0-29.9) 11/04/2016   Pain in thoracic spine    Prediabetes 03/28/2016   Unspecified essential hypertension    Unspecified sleep apnea    CPAP    Past Surgical History:  Procedure Laterality Date   BREAST BIOPSY Right 1999   neg   BREAST BIOPSY Left 09/01/2022   US  Axilla Biopsy, ribbon clip, neg   BREAST BIOPSY Left 09/01/2022   US  LT BREAST BX W LOC DEV 1ST LESION IMG BX SPEC US  GUIDE 09/01/2022 [facility]   BREAST CYST ASPIRATION Right    neg   COLONOSCOPY WITH PROPOFOL  N/A 08/07/2020   Procedure: COLONOSCOPY WITH PROPOFOL ;  Surgeon: Jinny Carmine, MD;  Location: [facility];  Service: Endoscopy;  Laterality: N/A;  Sleep apnea priority 4   TUBAL LIGATION      FAMILY HISTORY:  We obtained a detailed, 4-generation family  history.  Significant diagnoses are listed below: Family History  Problem Relation Age of Onset   Migraines Mother    Stroke Father    Hypertension Father    Cancer Sister    Hypertension Sister    Breast cancer Sister    Hypertension Sister    Cancer Sister        cervical   Cancer Sister        colon   Anuerysm Brother    Ms. Arps has 2 daughters, 1 and 25. She had 5 sisters. One sister passed of cervical cancer at 73. Another sister had breast cancer recently at 11, she lives in [place], has not had genetic testing. Another sister had stage IV colon cancer at 63 and is living at 55, has not had genetic testing. She lives in [place]  and patient reports genetic testing would not be financially feasible for her sister.   Ms. Magana mother passed at 16, father passed at 82. No other known cancers in the family.  Ms. Kalina is unaware of previous family history of genetic testing for hereditary cancer risks. There is no reported Ashkenazi Jewish ancestry. There is no known consanguinity.    GENETIC COUNSELING ASSESSMENT: Ms. Backs is a 63 y.o. female with a family history of colon cancer in her sister at 43 which is somewhat suggestive of a hereditary cancer syndrome and predisposition  to cancer. We, therefore, discussed and recommended the following at today's visit.   DISCUSSION: We discussed that approximately 10% of colorectal cancer is hereditary. Most cases of hereditary colorectal cancer are associated with Lynch syndrome genes, although there are other genes associated with hereditary cancer as well. Cancers and risks are gene specific. We discussed that testing is beneficial for several reasons including knowing about cancer risks, identifying potential screening and risk-reduction options that may be appropriate, and to understand if other family members could be at risk for cancer and allow them to undergo genetic testing.   We reviewed the characteristics, features  and inheritance patterns of hereditary cancer syndromes. We also discussed genetic testing, including the appropriate family members to test, the process of testing, insurance coverage and turn-around-time for results. We discussed the implications of a negative, positive and/or variant of uncertain significant result. We recommended Ms. Schader pursue genetic testing for the [facility] CancerNext-Expanded+RNA gene panel.   The CancerNext-Expanded gene panel offered by [facility] and includes sequencing, rearrangement, and RNA analysis for the following 77 genes: AIP, ALK, APC, ATM, AXIN2, BAP1, BARD1, BMPR1A, BRCA1, BRCA2, BRIP1, CDC73, CDH1, CDK4, CDKN1B, CDKN2A, CEBPA, CHEK2, CTNNA1, DDX41, DICER1, ETV6, FH, FLCN, GATA2, LZTR1, MAX, MBD4, MEN1, MET, MLH1, MSH2, MSH3, MSH6, MUTYH, NF1, NF2, NTHL1, PALB2, PHOX2B, PMS2, POT1, PRKAR1A, PTCH1, PTEN, RAD51C, RAD51D, RB1, RET, RPS20, RUNX1, SDHA, SDHAF2, SDHB, SDHC, SDHD, SMAD4, SMARCA4, SMARCB1, SMARCE1, STK11, SUFU, TMEM127, TP53, TSC1, TSC2, VHL, and WT1 (sequencing and deletion/duplication); EGFR, HOXB13, KIT, MITF, PDGFRA, POLD1, and POLE (sequencing only); EPCAM and GREM1 (deletion/duplication only).   Based on Ms. Hirota's family history of cancer, she meets medical criteria for genetic testing. Though Ms. Cheaney is not personally affected, there are no affected family members that are willing/able to undergo hereditary cancer testing.  Therefore, Ms. Boyceis the most informative family member available. Despite that she meets criteria, she may still have an out of pocket cost.   PLAN: After considering the risks, benefits, and limitations, Ms. Pellot provided informed consent to pursue genetic testing and the blood sample was sent to [facility] for analysis of the CancerNext-Expanded+. Results should be available within approximately 2-3 weeks' time, at which point they will be disclosed by telephone to Ms. Mcglade, as will any additional recommendations  warranted by these results. Ms. Andy will receive a summary of her genetic counseling visit and a copy of her results once available. This information will also be available in Epic.   Ms. Shetterly questions were answered to her satisfaction today. Our contact information was provided should additional questions or concerns arise. Thank you for the referral and allowing us  to share in the care of your patient.   Dena Cary, MS, [facility] Genetic Counselor [email or web address] Phone: [phone]  45 minutes were spent on the date of the encounter in service to the patient including preparation, face-to-face consultation, documentation and care coordination. Dr. Delinda was available for discussion regarding this case.   _______________________________________________________________________ For Office Staff:  Number of people involved in session: 1 Was an Intern/ student involved with case: no

## 2024-04-24 ENCOUNTER — Encounter: Payer: Self-pay | Admitting: Licensed Clinical Social Worker

## 2024-04-24 ENCOUNTER — Telehealth: Payer: Self-pay | Admitting: Licensed Clinical Social Worker

## 2024-04-24 ENCOUNTER — Ambulatory Visit: Payer: Self-pay | Admitting: Licensed Clinical Social Worker

## 2024-04-24 DIAGNOSIS — Z1379 Encounter for other screening for genetic and chromosomal anomalies: Secondary | ICD-10-CM

## 2024-04-24 NOTE — Progress Notes (Signed)
 HPI:   Ms. Nicolls was previously seen in the [facility] due to a family history of cancer and concerns regarding a hereditary predisposition to cancer. Please refer to our prior cancer genetics clinic note for more information regarding our discussion, assessment and recommendations, at the time. Ms. Zahn recent genetic test results were disclosed to her, as were recommendations warranted by these results. These results and recommendations are discussed in more detail below.  CANCER HISTORY:  Oncology History   No history exists.    FAMILY HISTORY:  We obtained a detailed, 4-generation family history.  Significant diagnoses are listed below: Family History  Problem Relation Age of Onset   Migraines Mother    Stroke Father    Hypertension Father    Cancer Sister    Hypertension Sister    Breast cancer Sister    Hypertension Sister    Cancer Sister        cervical   Cancer Sister        colon   Anuerysm Brother     Ms. Obrien has 2 daughters, 58 and 68. She had 5 sisters. One sister passed of cervical cancer at 23. Another sister had breast cancer recently at 29, she lives in [place], has not had genetic testing. Another sister had stage IV colon cancer at 62 and is living at 76, has not had genetic testing. She lives in [place]  and patient reports genetic testing would not be financially feasible for her sister.    Ms. Odom mother passed at 74, father passed at 66. No other known cancers in the family.   Ms. Gortney is unaware of previous family history of genetic testing for hereditary cancer risks. There is no reported Ashkenazi Jewish ancestry. There is no known consanguinity.       GENETIC TEST RESULTS:  The [facility] CancerNext-Expanded+RNA Panel found no pathogenic mutations.   The CancerNext-Expanded gene panel offered by [facility] and includes sequencing, rearrangement, and RNA analysis for the following 77 genes: AIP, ALK, APC, ATM,  AXIN2, BAP1, BARD1, BMPR1A, BRCA1, BRCA2, BRIP1, CDC73, CDH1, CDK4, CDKN1B, CDKN2A, CEBPA, CHEK2, CTNNA1, DDX41, DICER1, ETV6, FH, FLCN, GATA2, LZTR1, MAX, MBD4, MEN1, MET, MLH1, MSH2, MSH3, MSH6, MUTYH, NF1, NF2, NTHL1, PALB2, PHOX2B, PMS2, POT1, PRKAR1A, PTCH1, PTEN, RAD51C, RAD51D, RB1, RET, RPS20, RUNX1, SDHA, SDHAF2, SDHB, SDHC, SDHD, SMAD4, SMARCA4, SMARCB1, SMARCE1, STK11, SUFU, TMEM127, TP53, TSC1, TSC2, VHL, and WT1 (sequencing and deletion/duplication); EGFR, HOXB13, KIT, MITF, PDGFRA, POLD1, and POLE (sequencing only); EPCAM and GREM1 (deletion/duplication only).   The test report has been scanned into EPIC and is located under the Molecular Pathology section of the Results Review tab.  A portion of the result report is included below for reference. Genetic testing reported out on 04/23/2024.      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  At this time, it is unknown if this variant is associated with an increased risk for cancer or if it is benign, but most uncertain variants are reclassified to benign. It should not be used to make medical management decisions. With time, we suspect the laboratory will determine the significance of this variant, if any. If the laboratory reclassifies this variant, we will attempt to contact Ms. Arlen to discuss it further.   Even though a pathogenic variant was not identified, possible explanations for the cancer in the family may include: There may be no hereditary risk for cancer in the family. The cancers  in Ms. Zimny and/or her family may be sporadic/familial or due to other genetic and environmental factors. There may be a gene mutation in one of these genes that current testing methods cannot detect but that chance is small. There could be another gene that has not yet been discovered, or that we have not yet tested, that is responsible for the cancer diagnoses in the family.  It is also possible there is a  hereditary cause for the cancer in the family that Ms. Mcdonnell did not inherit  Therefore, it is important to remain in touch with cancer genetics in the future so that we can continue to offer Ms. Lucus the most up to date genetic testing.   ADDITIONAL GENETIC TESTING:  We discussed with Ms. Smedley that her genetic testing was fairly extensive.  If there are additional relevant genes identified to increase cancer risk that can be analyzed in the future, we would be happy to discuss and coordinate this testing at that time.    CANCER SCREENING RECOMMENDATIONS:  Ms. Bronkema test result is considered negative (normal).  This means that we have not identified a hereditary cause for her family history of cancer at this time.   An individual's cancer risk and medical management are not determined by genetic test results alone. Overall cancer risk assessment incorporates additional factors, including personal medical history, family history, and any available genetic information that may result in a personalized plan for cancer prevention and surveillance. Therefore, it is recommended she continue to follow the cancer management and screening guidelines provided by her  primary healthcare provider.  Based on the reported personal and family history, specific cancer screenings for Ms. Courtnay Bissoon Sharpe and her family include:  Colon Cancer Screening: Due to Ms. Rosasco's family's history of colon cancer, she is recommended to repeat colonoscopies at least every 5 years. More frequent colonoscopies may be recommended if polyps are identified.  RECOMMENDATIONS FOR FAMILY MEMBERS:   Since she did not inherit a identifiable mutation in a cancer predisposition gene included on this panel, her children could not have inherited a known mutation from her in one of these genes. Individuals in this family might be at some increased risk of developing cancer, over the general population risk, due to the family  history of cancer.  Individuals in the family should notify their providers of the family history of cancer. We recommend women in this family have a yearly mammogram beginning at age 90, or 24 years younger than the earliest onset of cancer, an annual clinical breast exam, and perform monthly breast self-exams.  Family members should have colonoscopies by at age 55, or earlier, as recommended by their providers. Other members of the family may still carry a pathogenic variant in one of these genes that Ms. Clippinger did not inherit. Based on the family history, we recommend her sister who was diagnosed with colon cancer at 57 consider comprehensive genetic testing/genetic counseling. Ms. Duran will let us  know if we can be of any assistance in coordinating genetic counseling and/or testing for this family member.   We do not recommend familial testing for the TP53 variant of uncertain significance (VUS).  FOLLOW-UP:  Lastly, we discussed with Ms. Kasparek that cancer genetics is a rapidly advancing field and it is possible that new genetic tests will be appropriate for her and/or her family members in the future. We encouraged her to remain in contact with cancer genetics on an annual basis so we can update her  personal and family histories and let her know of advances in cancer genetics that may benefit this family.   Our contact number was provided. Ms. Deeg questions were answered to her satisfaction, and she knows she is welcome to call us  at anytime with additional questions or concerns.    Dena Cary, MS, [facility] Genetic Counselor [email or web address] Phone: [phone]

## 2024-04-24 NOTE — Telephone Encounter (Signed)
 I contacted Regina Dunlap to discuss her genetic testing results. No pathogenic variants were identified in the 77 genes analyzed. Detailed clinic note to follow.   The test report has been scanned into EPIC and is located under the Molecular Pathology section of the Results Review tab.  A portion of the result report is included below for reference.     Regina Cary, MS, [facility] Genetic Counselor [email or web address] Phone: [phone]

## 2024-05-06 DIAGNOSIS — G4733 Obstructive sleep apnea (adult) (pediatric): Secondary | ICD-10-CM | POA: Diagnosis not present

## 2024-05-15 ENCOUNTER — Encounter: Payer: Self-pay | Admitting: Internal Medicine

## 2024-05-28 ENCOUNTER — Other Ambulatory Visit: Payer: Self-pay | Admitting: Internal Medicine

## 2024-05-28 DIAGNOSIS — K219 Gastro-esophageal reflux disease without esophagitis: Secondary | ICD-10-CM

## 2024-05-28 NOTE — Telephone Encounter (Signed)
 Requested Prescriptions  Pending Prescriptions Disp Refills   omeprazole  (PRILOSEC) 20 MG capsule [Pharmacy Med Name: OMEPRAZOLE  20MG  CAPSULES] 90 capsule 0    Sig: TAKE 1 CAPSULE(20 MG) BY MOUTH DAILY     Gastroenterology: Proton Pump Inhibitors Passed - 05/28/2024  3:39 PM      Passed - Valid encounter within last 12 months    Recent Outpatient Visits           2 months ago Prediabetes   [facility] Bernardo Fend, DO       Future Appointments             In 3 months Bernardo Fend, DO [facility] [place]

## 2024-06-06 ENCOUNTER — Other Ambulatory Visit: Payer: Self-pay | Admitting: Internal Medicine

## 2024-06-06 DIAGNOSIS — G4733 Obstructive sleep apnea (adult) (pediatric): Secondary | ICD-10-CM | POA: Diagnosis not present

## 2024-06-06 NOTE — Telephone Encounter (Signed)
 Requested Prescriptions  Pending Prescriptions Disp Refills   fluticasone  (FLONASE ) 50 MCG/ACT nasal spray [Pharmacy Med Name: FLUTICASONE  NASAL SP (120) RX] 16 g 2    Sig: SHAKE LIQUID AND USE 2 SPRAYS IN EACH NOSTRIL DAILY     Ear, Nose, and Throat: Nasal Preparations - Corticosteroids Passed - 06/06/2024  5:26 PM      Passed - Valid encounter within last 12 months    Recent Outpatient Visits           3 months ago Prediabetes   [facility] Bernardo Fend, DO       Future Appointments             In 2 months Bernardo Fend, DO [facility] [place]

## 2024-06-17 ENCOUNTER — Ambulatory Visit: Payer: Self-pay

## 2024-06-17 NOTE — Telephone Encounter (Signed)
 FYI Only or Action Required?: FYI only for provider.  Patient was last seen in primary care on 02/29/2024 by Bernardo Fend, DO.  Called Nurse Triage reporting Wheezing.  Symptoms began several days ago.  Interventions attempted: Nothing.  Symptoms are: unchanged.  Triage Disposition: See Physician Within 24 Hours  Patient/caregiver understands and will follow disposition?: Yes, will follow disposition  Copied from CRM #[id number]. Topic: Clinical - Red Word Triage >> Jun 17, 2024  2:39 PM Dedra B wrote: [facility] that prompted transfer to Nurse Triage: Pt experiencing wheezing. Warm transfer to NT. Reason for Disposition  New-onset mild wheezing  Answer Assessment - Initial Assessment Questions 1. ONSET: When did the wheezing begin?      States she is unsure, was told by her daughter about 4 days ago that she was wheezing audibly 2. RESPIRATORY STATUS: Describe your child's breathing. What does it sound like? (e.g., wheezing, stridor, grunting, weak cry, unable to speak, retractions, rapid rate, cyanosis)     Denies SOB/difficulty 4. ASSOCIATED VIRAL INFECTION: Does your child also have a cold, cough or fever?      Denies URI s/s 5. ASSOCIATED ALLERGIES: Does your child also have any allergies?      denies 6. RECURRENT EPISODES: Has your child had other attacks of wheezing? If so, ask: When was the last time? and What happened that time?      denies 7. FAMILY HISTORY: Does anyone in your family have asthma?      Denies hx in family  Pt states that she does use a CPAP. Pt states that she had not noticed the wheezing until her daughter had pointed it out to her. Pt denies SOB/difficulty breathing. Denies fevers. Denies URI s/s.  Protocols used: Wheezing - Other Than Asthma-P-AH

## 2024-06-18 ENCOUNTER — Ambulatory Visit: Admitting: Internal Medicine

## 2024-06-18 ENCOUNTER — Other Ambulatory Visit: Payer: Self-pay

## 2024-06-18 VITALS — BP 128/76 | HR 89 | Temp 98.0°F | Resp 16 | Ht 62.0 in | Wt 175.9 lb

## 2024-06-18 DIAGNOSIS — R062 Wheezing: Secondary | ICD-10-CM

## 2024-06-18 LAB — POC COVID19/FLU A&B COMBO
Covid Antigen, POC: NEGATIVE
Influenza A Antigen, POC: NEGATIVE
Influenza B Antigen, POC: NEGATIVE

## 2024-06-18 MED ORDER — ALBUTEROL SULFATE HFA 108 (90 BASE) MCG/ACT IN AERS: 2.0000 | INHALATION_SPRAY | Freq: Four times a day (QID) | RESPIRATORY_TRACT | 0 refills | Status: DC | PRN

## 2024-06-18 MED ORDER — IPRATROPIUM-ALBUTEROL 0.5-2.5 (3) MG/3ML IN SOLN
3.0000 mL | Freq: Once | RESPIRATORY_TRACT | Status: AC
  Administered 2024-06-18: 3 mL via RESPIRATORY_TRACT

## 2024-06-18 NOTE — Progress Notes (Signed)
 Acute Office Visit  Subjective:     Patient ID: Regina Dunlap, female    DOB: Apr 14, 1961, 63 y.o.   MRN: [id number]  Chief Complaint  Patient presents with   Wheezing   Back Pain    For 2 days    HPI Patient is in today for wheezing.   Discussed the use of AI scribe software for clinical note transcription with the patient, who gave verbal consent to proceed.  History of Present Illness Regina Dunlap is a 63 year old female with sleep apnea who presents with wheezing and back pain.  Wheezing has been present for a few days, characterized by a 'whistling' sound, without shortness of breath, cough, or upper respiratory symptoms. No home testing for COVID-19 or influenza has been conducted.  Back pain is located in the lower back bilaterally, extending from the neck to the upper back, and worsens with breathing, especially on the right side. She has experienced back pain previously but notes this episode is different.  She uses a CPAP machine for sleep apnea, with no recent changes to settings. She has been off work for the past week. Allergy symptoms typically occur in the fall and spring, skipping a year.   Review of Systems  Constitutional:  Negative for chills and fever.  HENT:  Negative for congestion, ear pain, sinus pain and sore throat.   Respiratory:  Positive for wheezing. Negative for cough, sputum production and shortness of breath.         Objective:    BP 128/76 (Cuff Size: Large)   Pulse 89   Temp 98 F (36.7 C) (Oral)   Resp 16   Ht 5' 2 (1.575 m)   Wt 175 lb 14.4 oz (79.8 kg)   SpO2 99%   BMI 32.17 kg/m  BP Readings from Last 3 Encounters:  06/18/24 128/76  03/25/24 101/62  02/29/24 120/68   Wt Readings from Last 3 Encounters:  06/18/24 175 lb 14.4 oz (79.8 kg)  02/29/24 174 lb 1.6 oz (79 kg)  02/09/24 175 lb (79.4 kg)      Physical Exam Constitutional:      Appearance: Normal appearance.  HENT:     Head: Normocephalic  and atraumatic.     Right Ear: Tympanic membrane, ear canal and external ear normal.     Left Ear: Tympanic membrane, ear canal and external ear normal.     Nose: Nose normal.     Mouth/Throat:     Mouth: Mucous membranes are moist.     Pharynx: Oropharynx is clear.  Eyes:     Extraocular Movements: Extraocular movements intact.     Conjunctiva/sclera: Conjunctivae normal.     Pupils: Pupils are equal, round, and reactive to light.  Cardiovascular:     Rate and Rhythm: Normal rate and regular rhythm.  Pulmonary:     Effort: Pulmonary effort is normal.     Breath sounds: Normal breath sounds. No wheezing, rhonchi or rales.     Comments: Decreased air sounds throughout  Skin:    General: Skin is warm and dry.  Neurological:     General: No focal deficit present.     Mental Status: She is alert. Mental status is at baseline.  Psychiatric:        Mood and Affect: Mood normal.        Behavior: Behavior normal.     No results found for any visits on 06/18/24.      Assessment & Plan:  Assessment & Plan Wheezing and decreased air movement Intermittent wheezing and decreased air movement without cough, fever, or upper respiratory symptoms. No asthma or COPD. Likely due to lung inflammation, possibly allergy-related, with high ragweed levels. No pneumonia evidence. - Administered albuterol  and ipratropium breathing treatment in office. - Prescribed albuterol  rescue inhaler for home use, 1-2 puffs every 4-6 hours as needed. - Test for COVID-19 and influenza to rule out viral causes - negative in the office today. - Advised to report changes such as increased wheezing, cough, or fever. - Reassess if symptoms persist or worsen.  - albuterol  (VENTOLIN  HFA) 108 (90 Base) MCG/ACT inhaler; Inhale 2 puffs into the lungs every 6 (six) hours as needed for wheezing or shortness of breath.  Dispense: 8 g; Refill: 0 - ipratropium-albuterol  (DUONEB) 0.5-2.5 (3) MG/3ML nebulizer solution 3 mL -  POC Covid19/Flu A&B Antigen   Return if symptoms worsen or fail to improve, for already scheduled.  Sharyle Fischer, DO

## 2024-06-28 ENCOUNTER — Other Ambulatory Visit: Payer: Self-pay | Admitting: Internal Medicine

## 2024-06-28 DIAGNOSIS — R7303 Prediabetes: Secondary | ICD-10-CM

## 2024-06-28 NOTE — Telephone Encounter (Signed)
 Too soon for refill.  Requested Prescriptions  Pending Prescriptions Disp Refills   metFORMIN  (GLUCOPHAGE ) 500 MG tablet [Pharmacy Med Name: METFORMIN  500MG  TABLETS] 90 tablet 1    Sig: TAKE 1 TABLET(500 MG) BY MOUTH DAILY WITH BREAKFAST     Endocrinology:  Diabetes - Biguanides Failed - 06/28/2024  4:08 PM      Failed - B12 Level in normal range and within 720 days    No results found for: VITAMINB12       Passed - Cr in normal range and within 360 days    Creat  Date Value Ref Range Status  08/12/2021 1.30 (H) 0.50 - 1.03 mg/dL Final   Creatinine, Ser  Date Value Ref Range Status  02/09/2024 0.94 0.57 - 1.00 mg/dL Final         Passed - HBA1C is between 0 and 7.9 and within 180 days    Hemoglobin A1C  Date Value Ref Range Status  02/29/2024 6.1 (A) 4.0 - 5.6 % Final   Hgb A1c MFr Bld  Date Value Ref Range Status  08/31/2023 6.1 (H) 4.8 - 5.6 % Final    Comment:             Prediabetes: 5.7 - 6.4          Diabetes: >6.4          Glycemic control for adults with diabetes: <7.0          Passed - eGFR in normal range and within 360 days    GFR, Est African American  Date Value Ref Range Status  02/09/2021 103 > OR = 60 mL/min/1.31m2 Final   GFR, Est Non African American  Date Value Ref Range Status  02/09/2021 88 > OR = 60 mL/min/1.71m2 Final   eGFR  Date Value Ref Range Status  02/09/2024 69 >59 mL/min/1.73 Final         Passed - Valid encounter within last 6 months    Recent Outpatient Visits           1 week ago Wheezing   [facility] Bernardo Fend, DO   4 months ago Prediabetes   [facility] Bernardo Fend, DO       Future Appointments             In 2 months Bernardo Fend, DO [facility] [place]            Passed - CBC within normal limits and completed in the last 12 months    WBC  Date Value Ref Range Status  08/31/2023 5.1 3.4 - 10.8  x10E3/uL Final  02/09/2021 6.2 3.8 - 10.8 Thousand/uL Final   RBC  Date Value Ref Range Status  08/31/2023 4.83 3.77 - 5.28 x10E6/uL Final  02/09/2021 4.45 3.80 - 5.10 Million/uL Final   Hemoglobin  Date Value Ref Range Status  08/31/2023 12.6 11.1 - 15.9 g/dL Final   Hematocrit  Date Value Ref Range Status  08/31/2023 40.2 34.0 - 46.6 % Final   MCHC  Date Value Ref Range Status  08/31/2023 31.3 (L) 31.5 - 35.7 g/dL Final  94/82/7977 67.3 32.0 - 36.0 g/dL Final   [facility]  Date Value Ref Range Status  08/31/2023 26.1 (L) 26.6 - 33.0 pg Final  02/09/2021 28.5 27.0 - 33.0 pg Final   MCV  Date Value Ref Range Status  08/31/2023 83 79 - 97 fL Final   No results found for: PLTCOUNTKUC, LABPLAT,  POCPLA RDW  Date Value Ref Range Status  08/31/2023 13.7 11.7 - 15.4 % Final

## 2024-07-02 ENCOUNTER — Ambulatory Visit: Payer: Self-pay

## 2024-07-02 NOTE — Telephone Encounter (Signed)
 FYI Only or Action Required?: FYI only for provider.  Patient was last seen in primary care on 06/18/2024 by Bernardo Fend, DO.  Called Nurse Triage reporting Wheezing.  Symptoms began several weeks ago.  Interventions attempted: Prescription medications: Albuterol  inhaler.  Symptoms are: unchanged.  Triage Disposition: See PCP Within 2 Weeks  Patient/caregiver understands and will follow disposition?: Yes  Copied from CRM #[id number]. Topic: Clinical - Red Word Triage >> Jul 02, 2024 11:11 AM Treva T wrote: [facility] that prompted transfer to Nurse Triage: Patient is calling reports she is still having wheezing, and increased fatigue/tiredness.   Reason for Disposition  [1] MILD longstanding difficulty breathing (e.g., minimal/no SOB at rest, SOB with walking, pulse < 100) AND [2] SAME as normal    Chronic allergies, onset of mild intermittent wheezing since 06/18/24. Seen by PCP at this time and prescribed albuterol  inhaler to manage wheezing episodes. Albuterol  effective but pt wanting further eval by PCP to know why onset of increased symptoms within past month. Not currently exacerbated.  Answer Assessment - Initial Assessment Questions 1. RESPIRATORY STATUS: Describe your breathing? (e.g., wheezing, shortness of breath, unable to speak, severe coughing)      Intermittent wheezing, mild. No SOB or cough.  2. ONSET: When did this breathing problem begin?      06/18/24  3. PATTERN Does the difficult breathing come and go, or has it been constant since it started?      Comes and goes  4. SEVERITY: How bad is your breathing? (e.g., mild, moderate, severe)      No SOB, no wheezing right now.  5. RECURRENT SYMPTOM: Have you had difficulty breathing before? If Yes, ask: When was the last time? and What happened that time?      Onset of intermittent wheezing 06/18/24  6. CARDIAC HISTORY: Do you have any history of heart disease? (e.g., heart attack, angina, bypass  surgery, angioplasty)      No.   7. LUNG HISTORY: Do you have any history of lung disease?  (e.g., pulmonary embolus, asthma, emphysema)     Hx of allergies, OSA, wears CPAP.  8. CAUSE: What do you think is causing the breathing problem?      Allergies, wanting to know why the intermittent wheezing started on 9/23. Seen by provider at that time and was prescribed albuterol  inhaler. Inhaler effectively resolves episodes of wheezing.  9. OTHER SYMPTOMS: Do you have any other symptoms? (e.g., chest pain, cough, dizziness, fever, runny nose)     Increased fatigue, bilateral scapula pain, mild. No CP or SOB.  10. O2 SATURATION MONITOR:  Do you use an oxygen saturation monitor (pulse oximeter) at home? If Yes, ask: What is your reading (oxygen level) today? What is your usual oxygen saturation reading? (e.g., 95%)       Does not monitor.  12. TRAVEL: Have you traveled out of the country in the last month? (e.g., travel history, exposures)       No  Protocols used: Breathing Difficulty-A-AH

## 2024-07-05 ENCOUNTER — Other Ambulatory Visit: Payer: Self-pay | Admitting: Internal Medicine

## 2024-07-05 DIAGNOSIS — Z1231 Encounter for screening mammogram for malignant neoplasm of breast: Secondary | ICD-10-CM

## 2024-07-09 ENCOUNTER — Other Ambulatory Visit: Payer: Self-pay

## 2024-07-09 ENCOUNTER — Ambulatory Visit: Admitting: Internal Medicine

## 2024-07-09 ENCOUNTER — Encounter: Payer: Self-pay | Admitting: Internal Medicine

## 2024-07-09 VITALS — BP 130/72 | HR 99 | Temp 98.2°F | Resp 16 | Ht 62.0 in | Wt 174.0 lb

## 2024-07-09 DIAGNOSIS — R062 Wheezing: Secondary | ICD-10-CM | POA: Diagnosis not present

## 2024-07-09 DIAGNOSIS — G8929 Other chronic pain: Secondary | ICD-10-CM

## 2024-07-09 DIAGNOSIS — K219 Gastro-esophageal reflux disease without esophagitis: Secondary | ICD-10-CM

## 2024-07-09 DIAGNOSIS — Z23 Encounter for immunization: Secondary | ICD-10-CM

## 2024-07-09 DIAGNOSIS — M546 Pain in thoracic spine: Secondary | ICD-10-CM

## 2024-07-09 MED ORDER — METHYLPREDNISOLONE 4 MG PO TBPK
ORAL_TABLET | ORAL | 0 refills | Status: AC
Start: 2024-07-09 — End: ?

## 2024-07-09 MED ORDER — PANTOPRAZOLE SODIUM 20 MG PO TBEC: 20.0000 mg | DELAYED_RELEASE_TABLET | Freq: Every day | ORAL | 1 refills | Status: AC

## 2024-07-09 NOTE — Progress Notes (Signed)
 Established Patient Office Visit  Subjective   Patient ID: Regina Dunlap, female    DOB: 07-26-1961  Age: 63 y.o. MRN: [id number]  Chief Complaint  Patient presents with   Wheezing   Shortness of Breath    Wheezing  Associated symptoms include shortness of breath. Pertinent negatives include no chills, coughing or fever.  Shortness of Breath Pertinent negatives include no fever or wheezing.    Patient here for follow up on chronic medical conditions.   Discussed the use of AI scribe software for clinical note transcription with the patient, who gave verbal consent to proceed.  History of Present Illness Regina Dunlap is a 63 year old female with acid reflux who presents with persistent wheezing and concerns about medication side effects.  She experiences persistent wheezing despite previous improvement and is concerned it may be related to her long-term use of omeprazole  for acid reflux. She has been on omeprazole  for over a year and is aware of potential respiratory side effects, including cough and bronchospasm. She experiences nighttime coughing, which has woken her up twice. Albuterol , used as a rescue inhaler, causes nervousness, shakiness, and a racing heart, significantly affecting her ability to function at work. Intermittent right-sided back pain, described as muscular, is relieved by Biofreeze and rest. She takes Zyrtec daily for allergies and has a history of pneumonia, which required emergency care. She is concerned about traveling due to her wheezing and plans to stay local for her upcoming vacation.   Review of Systems  Constitutional:  Negative for chills and fever.  Respiratory:  Positive for shortness of breath. Negative for cough and wheezing.       Objective:     BP 130/72 (Cuff Size: Large)   Pulse 99   Temp 98.2 F (36.8 C) (Oral)   Resp 16   Ht 5' 2 (1.575 m)   Wt 174 lb (78.9 kg)   SpO2 99%   BMI 31.83 kg/m  BP Readings from Last  3 Encounters:  07/09/24 130/72  06/18/24 128/76  03/25/24 101/62   Wt Readings from Last 3 Encounters:  07/09/24 174 lb (78.9 kg)  06/18/24 175 lb 14.4 oz (79.8 kg)  02/29/24 174 lb 1.6 oz (79 kg)      Physical Exam Constitutional:      Appearance: Normal appearance.  HENT:     Head: Normocephalic and atraumatic.  Eyes:     Conjunctiva/sclera: Conjunctivae normal.  Cardiovascular:     Rate and Rhythm: Normal rate and regular rhythm.  Pulmonary:     Effort: Pulmonary effort is normal.     Breath sounds: Normal breath sounds. No wheezing, rhonchi or rales.  Skin:    General: Skin is warm and dry.  Neurological:     General: No focal deficit present.     Mental Status: She is alert. Mental status is at baseline.  Psychiatric:        Mood and Affect: Mood normal.        Behavior: Behavior normal.      No results found for any visits on 07/09/24.  Last CBC Lab Results  Component Value Date   WBC 5.1 08/31/2023   HGB 12.6 08/31/2023   HCT 40.2 08/31/2023   MCV 83 08/31/2023   MCH 26.1 (L) 08/31/2023   RDW 13.7 08/31/2023   PLT 238 08/31/2023   Last metabolic panel Lab Results  Component Value Date   GLUCOSE 104 (H) 02/09/2024   NA 142 02/09/2024  K 3.6 02/09/2024   CL 102 02/09/2024   CO2 22 02/09/2024   BUN 16 02/09/2024   CREATININE 0.94 02/09/2024   EGFR 69 02/09/2024   CALCIUM 9.4 02/09/2024   PROT 7.1 08/31/2023   ALBUMIN 4.4 08/31/2023   LABGLOB 2.7 08/31/2023   AGRATIO 1.9 07/28/2022   BILITOT 0.4 08/31/2023   ALKPHOS 106 08/31/2023   AST 24 08/31/2023   ALT 17 08/31/2023   Last lipids Lab Results  Component Value Date   CHOL 189 08/31/2023   HDL 58 08/31/2023   LDLCALC 103 (H) 08/31/2023   TRIG 163 (H) 08/31/2023   CHOLHDL 3.3 08/31/2023   Last hemoglobin A1c Lab Results  Component Value Date   HGBA1C 6.1 (A) 02/29/2024   Last thyroid  functions Lab Results  Component Value Date   TSH 3.420 04/14/2022   Last vitamin D  Lab  Results  Component Value Date   VD25OH 28.4 (L) 08/31/2023   Last vitamin B12 and Folate No results found for: VITAMINB12, FOLATE    The 10-year ASCVD risk score (Arnett DK, et al., 2019) is: 4.7%    Assessment & Plan:   Assessment & Plan Wheezing and airway reactivity Intermittent wheezing possibly related to GERD or post-viral effects. Albuterol  inhaler causes palpitations and hyperactivity. Differential includes asthma or COPD, to be evaluated with pulmonary function test. Steroid inhaler deferred due to cost and insurance. - Order pulmonary function test. - Prescribe Medrol Dosepak for wheezing and musculoskeletal pain. - Continue albuterol  inhaler as needed.  Gastroesophageal reflux disease (GERD) Chronic GERD managed with omeprazole , possibly contributing to respiratory symptoms. Long-term use increases likelihood of side effects. - Discontinue omeprazole . - Prescribe pantoprazole 20 mg.  Musculoskeletal back pain, right side Intermittent right-sided pain relieved with Biofreeze and rest. Medrol Dosepak may assist with inflammation and pain relief. - Prescribe Medrol Dosepak for musculoskeletal pain.  Immunization: Pneumococcal vaccine Eligible for pneumococcal vaccine due to age and history of pneumonia. Vaccine reduces risk of severe outcomes and complications from pneumonia. - Administer pneumococcal vaccine.  - Flu vaccine trivalent PF, 6mos and older(Flulaval,Afluria,Fluarix,Fluzone) - Pneumococcal conjugate vaccine 20-valent (Prevnar 20) - pantoprazole (PROTONIX) 20 MG tablet; Take 1 tablet (20 mg total) by mouth daily.  Dispense: 90 tablet; Refill: 1 - Pulmonary Function Test [facility] Only; Future - methylPREDNISolone (MEDROL DOSEPAK) 4 MG TBPK tablet; Use as directed.  Dispense: 21 each; Refill: 0   Return for already scheduled.    Sharyle Fischer, DO

## 2024-07-10 ENCOUNTER — Other Ambulatory Visit: Payer: Self-pay | Admitting: Internal Medicine

## 2024-07-10 DIAGNOSIS — R062 Wheezing: Secondary | ICD-10-CM

## 2024-07-11 NOTE — Telephone Encounter (Signed)
 Requested Prescriptions  Pending Prescriptions Disp Refills   albuterol  (VENTOLIN  HFA) 108 (90 Base) MCG/ACT inhaler [Pharmacy Med Name: ALBUTEROL  HFA INH (200 PUFFS) 6.7GM] 8 g 2    Sig: INHALE 2 PUFFS BY MOUTH EVERY 6 HOURS AS NEEDED FOR WHEEZING OR SHORTNESS OF BREATH     Pulmonology:  Beta Agonists 2 Passed - 07/11/2024  4:13 PM      Passed - Last BP in normal range    BP Readings from Last 1 Encounters:  07/09/24 130/72         Passed - Last Heart Rate in normal range    Pulse Readings from Last 1 Encounters:  07/09/24 99         Passed - Valid encounter within last 12 months    Recent Outpatient Visits           2 days ago Wheezing   [facility] Bernardo Fend, DO   3 weeks ago Wheezing   [facility] Bernardo Fend, DO   4 months ago Prediabetes   [facility] Bernardo Fend, DO       Future Appointments             In 1 month Bernardo Fend, DO [facility] [place]

## 2024-07-31 ENCOUNTER — Other Ambulatory Visit: Payer: Self-pay | Admitting: Internal Medicine

## 2024-07-31 DIAGNOSIS — K219 Gastro-esophageal reflux disease without esophagitis: Secondary | ICD-10-CM

## 2024-08-01 NOTE — Telephone Encounter (Signed)
 Requested Prescriptions  Refused Prescriptions Disp Refills   omeprazole  (PRILOSEC) 20 MG capsule [Pharmacy Med Name: OMEPRAZOLE  20MG  CAPSULES] 90 capsule 0    Sig: TAKE 1 CAPSULE(20 MG) BY MOUTH DAILY     Gastroenterology: Proton Pump Inhibitors Passed - 08/01/2024  2:29 PM      Passed - Valid encounter within last 12 months    Recent Outpatient Visits           3 weeks ago Wheezing   [facility] Bernardo Fend, DO   1 month ago Wheezing   [facility] Bernardo Fend, DO   5 months ago Prediabetes   [facility] Bernardo Fend, DO       Future Appointments             In 1 month Bernardo Fend, DO [facility] [address]

## 2024-08-06 DIAGNOSIS — G4733 Obstructive sleep apnea (adult) (pediatric): Secondary | ICD-10-CM | POA: Diagnosis not present

## 2024-08-16 ENCOUNTER — Ambulatory Visit
Admission: RE | Admit: 2024-08-16 | Discharge: 2024-08-16 | Disposition: A | Discharge status: Home / Self Care | Source: Ambulatory Visit | Attending: Internal Medicine | Admitting: Internal Medicine

## 2024-08-16 DIAGNOSIS — Z1231 Encounter for screening mammogram for malignant neoplasm of breast: Secondary | ICD-10-CM | POA: Insufficient documentation

## 2024-08-21 ENCOUNTER — Ambulatory Visit: Payer: Self-pay | Admitting: Internal Medicine

## 2024-08-30 NOTE — Progress Notes (Signed)
 Name: Regina Dunlap   MRN: [id number]    DOB: 01/11/1961   Date:09/02/2024       Progress Note  Subjective  Chief Complaint  Chief Complaint  Patient presents with   Annual Exam    HPI  Patient presents for annual CPE.  Discussed the use of AI scribe software for clinical note transcription with the patient, who gave verbal consent to proceed.  History of Present Illness Regina Dunlap is a 63 year old female who presents for a well-woman exam and Pap smear.  She is here for a routine well-woman exam including Pap smear. Last Pap in 2021 was normal and she chooses to repeat it today. She has no concerns about sexually transmitted infections and no vaginal bleeding, discharge change, pain, or itching.  She is up to date on flu, pneumonia, tetanus, shingles, and COVID-19 vaccines. Her last mammogram was in November 2025. She is current on colon cancer screening with the next test due next year.  She requests a refill of niacinamide powder, which she uses at night and finds helpful. She recently had a similar rash on her face that she thinks may be due to a product. She is using cortisone and aloe vera for this and is considering Benadryl cream for itching. She takes Zyrtec for allergies.   Diet: Regular Exercise: 3 days 30 minutes  Last Eye Exam: completed Last Dental Exam: completed  Flowsheet Row Office Visit from 09/02/2024 in [facility]  AUDIT-C Score 0   Depression: Phq 9 is  negative    09/02/2024    9:44 AM 02/29/2024    9:50 AM 08/31/2023   10:46 AM 06/23/2023    8:15 AM 04/04/2023    1:15 PM  Depression screen PHQ 2/9  Decreased Interest 0 0 0 0 0  Down, Depressed, Hopeless 0 0 0 0 0  PHQ - 2 Score 0 0 0 0 0  Altered sleeping     0  Tired, decreased energy     0  Change in appetite     0  Feeling bad or failure about yourself      0  Trouble concentrating     0  Moving slowly or fidgety/restless     0  Suicidal thoughts      0  PHQ-9 Score     0   Difficult doing work/chores     Not difficult at all     Data saved with a previous flowsheet row definition   Hypertension: BP Readings from Last 3 Encounters:  07/09/24 130/72  06/18/24 128/76  03/25/24 101/62   Obesity: Wt Readings from Last 3 Encounters:  09/02/24 176 lb 9.6 oz (80.1 kg)  07/09/24 174 lb (78.9 kg)  06/18/24 175 lb 14.4 oz (79.8 kg)   BMI Readings from Last 3 Encounters:  09/02/24 32.30 kg/m  07/09/24 31.83 kg/m  06/18/24 32.17 kg/m     Vaccines: reviewed with the patient. UTD.   Hep C Screening: completed STD testing and prevention (HIV/chl/gon/syphilis): no concerns  Intimate partner violence: negative screen  Sexual History :active Menstrual History/LMP/Abnormal Bleeding: post-menopausal, no issues with vaginal bleeding  Discussed importance of follow up if any post-menopausal bleeding: no  Incontinence Symptoms: negative for symptoms   Breast cancer:  - Last Mammogram: 08/21/2024 Birads-1  Osteoporosis Prevention : Discussed high calcium and vitamin D  supplementation, weight bearing exercises Bone density :yes 09/18/2023  Cervical cancer screening: will do today Skin cancer: Discussed monitoring for  atypical lesions  Colorectal cancer:   08/16/2020, repeat in 5 years, due next year Lung cancer:  Low Dose CT Chest recommended if Age 68-80 years, 20 pack-year currently smoking OR have quit w/in 15years. Patient does not qualify for screen   ECG: 02/09/2024  Advanced Care Planning: A voluntary discussion about advance care planning including the explanation and discussion of advance directives.  Discussed health care proxy and Living will, and the patient was able to identify a health care proxy as Regina Dunlap (husband).  Patient does have a living will and power of attorney of health care   Patient Active Problem List   Diagnosis Date Noted   Genetic testing 04/24/2024   OSA on CPAP 10/25/2021   CPAP use counseling  10/25/2021   Essential hypertension 10/25/2021   Vitamin D  deficiency 05/01/2020   Hot flashes 07/15/2019   Varicose veins of leg with pain, left 04/23/2019   Hypokalemia 11/23/2017   Postmenopausal bleeding 10/17/2017   Varicose veins of both lower extremities without ulcer or inflammation 04/27/2017   Sleep apnea 04/24/2017   Encounter for screening colonoscopy 04/24/2017   Obesity (BMI 30.0-34.9) 11/04/2016   Fatty liver 11/04/2016   History of concussion 06/07/2016   MCI (mild cognitive impairment) 06/07/2016   Prediabetes 03/28/2016   Memory impairment 03/28/2016   Decreased sense of smell 03/28/2016   Hypercholesterolemia with hypertriglyceridemia 10/13/2015   Encounter for screening mammogram for malignant neoplasm of breast 10/13/2015   GERD without esophagitis 10/13/2015   Screening for STD (sexually transmitted disease) 10/13/2015   Allergic rhinitis 09/16/2015   Lipoma of arm 09/16/2015   Family history of colon cancer 09/16/2015   Hypertension goal BP (blood pressure) < 140/90     Past Surgical History:  Procedure Laterality Date   BREAST BIOPSY Right 1999   neg   BREAST BIOPSY Left 09/01/2022   US  Axilla Biopsy, ribbon clip, neg   BREAST BIOPSY Left 09/01/2022   US  LT BREAST BX W LOC DEV 1ST LESION IMG BX SPEC US  GUIDE 09/01/2022 [facility]   BREAST CYST ASPIRATION Right    neg   COLONOSCOPY WITH PROPOFOL  N/A 08/07/2020   Procedure: COLONOSCOPY WITH PROPOFOL ;  Surgeon: Jinny Carmine, MD;  [facility];  Service: Endoscopy;  Laterality: N/A;  Sleep apnea priority 4   TUBAL LIGATION      Family History  Problem Relation Age of Onset   Migraines Mother    Stroke Father    Hypertension Father    Cancer Sister    Hypertension Sister    Breast cancer Sister    Hypertension Sister    Cancer Sister        cervical   Cancer Sister        colon   Anuerysm Brother     Social History   Socioeconomic History   Marital status: Married     Spouse name: Not on file   Number of children: Not on file   Years of education: Not on file   Highest education level: GED or equivalent  Occupational History   Not on file  Tobacco Use   Smoking status: Never   Smokeless tobacco: Never  Vaping Use   Vaping status: Never Used  Substance and Sexual Activity   Alcohol use: Yes    Comment: social   Drug use: No   Sexual activity: Yes  Other Topics Concern   Not on file  Social History Narrative   Not on file   Social Drivers  of Health   Financial Resource Strain: Low Risk  (09/02/2024)   Overall Financial Resource Strain (CARDIA)    Difficulty of Paying Living Expenses: Not very hard  Food Insecurity: No Food Insecurity (09/02/2024)   Hunger Vital Sign    Worried About Running Out of Food in the Last Year: Never true    Ran Out of Food in the Last Year: Never true  Transportation Needs: No Transportation Needs (09/02/2024)   PRAPARE - Administrator, Civil Service (Medical): No    Lack of Transportation (Non-Medical): No  Physical Activity: Sufficiently Active (09/02/2024)   Exercise Vital Sign    Days of Exercise per Week: 4 days    Minutes of Exercise per Session: 60 min  Stress: No Stress Concern Present (09/02/2024)   [employer] of Occupational Health - Occupational Stress Questionnaire    Feeling of Stress: Only a little  Social Connections: Moderately Isolated (09/02/2024)   Social Connection and Isolation Panel    Frequency of Communication with Friends and Family: More than three times a week    Frequency of Social Gatherings with Friends and Family: More than three times a week    Attends Religious Services: Never    Database Administrator or Organizations: No    Attends Banker Meetings: Never    Marital Status: Married  Catering Manager Violence: Not At Risk (09/02/2024)   Humiliation, Afraid, Rape, and Kick questionnaire    Fear of Current or Ex-Partner: No    Emotionally  Abused: No    Physically Abused: No    Sexually Abused: No     Current Outpatient Medications:    albuterol  (VENTOLIN  HFA) 108 (90 Base) MCG/ACT inhaler, INHALE 2 PUFFS BY MOUTH EVERY 6 HOURS AS NEEDED FOR WHEEZING OR SHORTNESS OF BREATH, Disp: 8 g, Rfl: 2   fluticasone  (FLONASE ) 50 MCG/ACT nasal spray, SHAKE LIQUID AND USE 2 SPRAYS IN EACH NOSTRIL DAILY, Disp: 16 g, Rfl: 2   metFORMIN  (GLUCOPHAGE ) 500 MG tablet, TAKE 1 TABLET(500 MG) BY MOUTH DAILY WITH BREAKFAST, Disp: 90 tablet, Rfl: 1   methylPREDNISolone  (MEDROL  DOSEPAK) 4 MG TBPK tablet, Use as directed., Disp: 21 each, Rfl: 0   nystatin  (MYCOSTATIN /NYSTOP ) powder, Apply 1 Application topically 3 (three) times daily., Disp: 15 g, Rfl: 0   olmesartan -hydrochlorothiazide (BENICAR  HCT) 40-25 MG tablet, TAKE 1 TABLET BY MOUTH DAILY, Disp: 90 tablet, Rfl: 1   pantoprazole  (PROTONIX ) 20 MG tablet, Take 1 tablet (20 mg total) by mouth daily., Disp: 90 tablet, Rfl: 1   simvastatin  (ZOCOR ) 20 MG tablet, TAKE 1 TABLET(20 MG) BY MOUTH DAILY AT 6 PM, Disp: 90 tablet, Rfl: 1   SUMAtriptan  (IMITREX ) 50 MG tablet, Take 0.5-1 tablets (25-50 mg total) by mouth every 2 (two) hours as needed for migraine (max 200 mg in 24 hours). May repeat in 2 hours if headache persists or recurs., Disp: 10 tablet, Rfl: 0   tiZANidine  (ZANAFLEX ) 4 MG tablet, Take 1 tablet (4 mg total) by mouth every 6 (six) hours as needed for muscle spasms., Disp: 90 tablet, Rfl: 0   TURMERIC PO, Take by mouth daily. Reported on 03/28/2016, Disp: , Rfl:    VITAMIN E PO, Take by mouth as needed., Disp: , Rfl:    metoprolol  tartrate (LOPRESSOR ) 100 MG tablet, TAKE 1 TABLET 2 HR PRIOR TO CARDIAC PROCEDURE (Patient not taking: Reported on 07/09/2024), Disp: 1 tablet, Rfl: 0   Vitamin D , Ergocalciferol , (DRISDOL ) 1.25 MG (50000 UNIT) CAPS capsule, TAKE  1 CAPSULE BY MOUTH EVERY 7 DAYS (Patient not taking: Reported on 02/29/2024), Disp: 12 capsule, Rfl: 0  Allergies  Allergen Reactions   Amoxil   [Amoxicillin ]    Ciprofloxacin Diarrhea, Nausea And Vomiting and Other (See Comments)    Other Reaction(s): diarrhea, vomiting   Lisinopril Cough   Migraine Formula [Aspirin-Acetaminophen -Caffeine] Rash    Midrina; broke out in rash    Povidone Iodine Rash     Review of Systems  All other systems reviewed and are negative.    Objective  Vitals:   09/02/24 0945  Pulse: 86  Resp: 16  Temp: 97.9 F (36.6 C)  TempSrc: Oral  SpO2: 96%  Weight: 176 lb 9.6 oz (80.1 kg)  Height: 5' 2 (1.575 m)    Body mass index is 32.3 kg/m.  Physical Exam Exam conducted with a chaperone present.  Constitutional:      Appearance: Normal appearance.  HENT:     Head: Normocephalic and atraumatic.  Eyes:     Conjunctiva/sclera: Conjunctivae normal.  Cardiovascular:     Rate and Rhythm: Normal rate and regular rhythm.  Pulmonary:     Effort: Pulmonary effort is normal.     Breath sounds: Normal breath sounds.  Chest:  Breasts:    Right: Normal.     Left: Normal.  Genitourinary:    Comments: External genitalia within normal limits.  Vaginal mucosa pink, moist, normal rugae.  Nonfriable cervix without lesions, no discharge but small amount of bleeding noted on speculum exam.  Bimanual exam revealed normal, nongravid uterus.  No cervical motion tenderness. No adnexal masses bilaterally.    Lymphadenopathy:     Upper Body:     Right upper body: No supraclavicular, axillary or pectoral adenopathy.     Left upper body: No supraclavicular, axillary or pectoral adenopathy.  Skin:    General: Skin is warm and dry.     Comments: Hives like rash on the left side of face  Neurological:     General: No focal deficit present.     Mental Status: She is alert. Mental status is at baseline.  Psychiatric:        Mood and Affect: Mood normal.        Behavior: Behavior normal.     Last CBC Lab Results  Component Value Date   WBC 5.1 08/31/2023   HGB 12.6 08/31/2023   HCT 40.2 08/31/2023    MCV 83 08/31/2023   MCH 26.1 (L) 08/31/2023   RDW 13.7 08/31/2023   PLT 238 08/31/2023   Last metabolic panel Lab Results  Component Value Date   GLUCOSE 104 (H) 02/09/2024   NA 142 02/09/2024   K 3.6 02/09/2024   CL 102 02/09/2024   CO2 22 02/09/2024   BUN 16 02/09/2024   CREATININE 0.94 02/09/2024   EGFR 69 02/09/2024   CALCIUM 9.4 02/09/2024   PROT 7.1 08/31/2023   ALBUMIN 4.4 08/31/2023   LABGLOB 2.7 08/31/2023   AGRATIO 1.9 07/28/2022   BILITOT 0.4 08/31/2023   ALKPHOS 106 08/31/2023   AST 24 08/31/2023   ALT 17 08/31/2023   Last lipids Lab Results  Component Value Date   CHOL 189 08/31/2023   HDL 58 08/31/2023   LDLCALC 103 (H) 08/31/2023   TRIG 163 (H) 08/31/2023   CHOLHDL 3.3 08/31/2023   Last hemoglobin A1c Lab Results  Component Value Date   HGBA1C 6.1 (A) 02/29/2024   Last thyroid  functions Lab Results  Component Value Date   TSH 3.420 04/14/2022  Last vitamin D  Lab Results  Component Value Date   VD25OH 28.4 (L) 08/31/2023   Last vitamin B12 and Folate No results found for: VITAMINB12, FOLATE    Assessment & Plan  Assessment & Plan Woman's Wellness Visit Routine well woman exam. Last Pap smear in 2021 was normal. Vaccinations are up to date. Recent mammogram was normal. Colon cancer screening is due next year. - Performed Pap smear today. - Conducted breast exam. - Continue routine health maintenance and screenings as scheduled.  Allergic contact dermatitis of the face Recurrent facial rash, likely allergic contact dermatitis, possibly from new skincare products. Current treatment includes cortisone and aloe vera, but cortisone is not recommended for facial use. - Apply topical Benadryl cream for itch and rash. - Continue oral antihistamine (Zartan). - Avoid cortisone on the face. - Identify and eliminate potential allergens from skincare routine.  Yeast Dermatitis Still has one small lesion under left breast, trying to dry off  well after showers and using Nystatin  but needing refills.   - nystatin  (MYCOSTATIN /NYSTOP ) powder; Apply 1 Application topically 3 (three) times daily.  Dispense: 60 g; Refill: 2 - Cytology - PAP - HgB A1c - Lipid Profile - CBC w/Diff/Platelet - Comprehensive Metabolic Panel (CMET) - Vitamin D  (25 hydroxy)   -USPSTF grade A and B recommendations reviewed with patient; age-appropriate recommendations, preventive care, screening tests, etc discussed and encouraged; healthy living encouraged; see AVS for patient education given to patient -Discussed importance of 150 minutes of physical activity weekly, eat two servings of fish weekly, eat one serving of tree nuts ( cashews, pistachios, pecans, almonds.SABRA) every other day, eat 6 servings of fruit/vegetables daily and drink plenty of water  and avoid sweet beverages.   -Reviewed Health Maintenance: Yes.

## 2024-09-02 ENCOUNTER — Other Ambulatory Visit (HOSPITAL_COMMUNITY)
Admission: RE | Admit: 2024-09-02 | Discharge: 2024-09-02 | Disposition: A | Discharge status: Home / Self Care | Source: Ambulatory Visit | Attending: Internal Medicine | Admitting: Internal Medicine

## 2024-09-02 ENCOUNTER — Ambulatory Visit (INDEPENDENT_AMBULATORY_CARE_PROVIDER_SITE_OTHER): Admitting: Internal Medicine

## 2024-09-02 ENCOUNTER — Other Ambulatory Visit: Payer: Self-pay | Admitting: Internal Medicine

## 2024-09-02 ENCOUNTER — Encounter: Payer: Self-pay | Admitting: Internal Medicine

## 2024-09-02 VITALS — BP 120/70 | HR 86 | Temp 97.9°F | Resp 16 | Ht 62.0 in | Wt 176.6 lb

## 2024-09-02 DIAGNOSIS — E782 Mixed hyperlipidemia: Secondary | ICD-10-CM

## 2024-09-02 DIAGNOSIS — I10 Essential (primary) hypertension: Secondary | ICD-10-CM

## 2024-09-02 DIAGNOSIS — K219 Gastro-esophageal reflux disease without esophagitis: Secondary | ICD-10-CM

## 2024-09-02 DIAGNOSIS — R7303 Prediabetes: Secondary | ICD-10-CM | POA: Diagnosis not present

## 2024-09-02 DIAGNOSIS — Z0000 Encounter for general adult medical examination without abnormal findings: Secondary | ICD-10-CM | POA: Diagnosis not present

## 2024-09-02 DIAGNOSIS — B372 Candidiasis of skin and nail: Secondary | ICD-10-CM | POA: Diagnosis not present

## 2024-09-02 DIAGNOSIS — Z124 Encounter for screening for malignant neoplasm of cervix: Secondary | ICD-10-CM

## 2024-09-02 DIAGNOSIS — E559 Vitamin D deficiency, unspecified: Secondary | ICD-10-CM | POA: Diagnosis not present

## 2024-09-02 DIAGNOSIS — L239 Allergic contact dermatitis, unspecified cause: Secondary | ICD-10-CM | POA: Diagnosis not present

## 2024-09-02 MED ORDER — NYSTATIN 100000 UNIT/GM EX POWD: 1.0000 | Freq: Three times a day (TID) | CUTANEOUS | 2 refills | Status: AC

## 2024-09-03 ENCOUNTER — Ambulatory Visit: Payer: Self-pay | Admitting: Internal Medicine

## 2024-09-03 LAB — COMPREHENSIVE METABOLIC PANEL WITH GFR
ALT: 18 IU/L (ref 0–32)
AST: 22 IU/L (ref 0–40)
Albumin: 4.5 g/dL (ref 3.9–4.9)
Alkaline Phosphatase: 94 IU/L (ref 49–135)
BUN/Creatinine Ratio: 20 (ref 12–28)
BUN: 17 mg/dL (ref 8–27)
Bilirubin Total: 0.3 mg/dL (ref 0.0–1.2)
CO2: 24 mmol/L (ref 20–29)
Calcium: 9.5 mg/dL (ref 8.7–10.3)
Chloride: 102 mmol/L (ref 96–106)
Creatinine, Ser: 0.83 mg/dL (ref 0.57–1.00)
Globulin, Total: 2.7 g/dL (ref 1.5–4.5)
Glucose: 94 mg/dL (ref 70–99)
Potassium: 4 mmol/L (ref 3.5–5.2)
Sodium: 140 mmol/L (ref 134–144)
Total Protein: 7.2 g/dL (ref 6.0–8.5)
eGFR: 80 mL/min/1.73 (ref >59)

## 2024-09-03 LAB — CBC WITH DIFFERENTIAL/PLATELET
Basophils Absolute: 0.1 x10E3/uL (ref 0.0–0.2)
Basos: 1 %
EOS (ABSOLUTE): 0.3 x10E3/uL (ref 0.0–0.4)
Eos: 5 %
Hematocrit: 42.2 % (ref 34.0–46.6)
Hemoglobin: 13.5 g/dL (ref 11.1–15.9)
Immature Grans (Abs): 0 x10E3/uL (ref 0.0–0.1)
Immature Granulocytes: 0 %
Lymphocytes Absolute: 2.8 x10E3/uL (ref 0.7–3.1)
Lymphs: 43 %
MCH: 27.4 pg (ref 26.6–33.0)
MCHC: 32 g/dL (ref 31.5–35.7)
MCV: 86 fL (ref 79–97)
Monocytes Absolute: 0.3 x10E3/uL (ref 0.1–0.9)
Monocytes: 5 %
Neutrophils Absolute: 3 x10E3/uL (ref 1.4–7.0)
Neutrophils: 46 %
Platelets: 275 x10E3/uL (ref 150–450)
RBC: 4.92 x10E6/uL (ref 3.77–5.28)
RDW: 13.8 % (ref 11.7–15.4)
WBC: 6.4 x10E3/uL (ref 3.4–10.8)

## 2024-09-03 LAB — HEMOGLOBIN A1C
Est. average glucose Bld gHb Est-mCnc: 131 mg/dL
Hgb A1c MFr Bld: 6.2 % — ABNORMAL HIGH (ref 4.8–5.6)

## 2024-09-03 LAB — LIPID PANEL
Chol/HDL Ratio: 3 ratio (ref 0.0–4.4)
Cholesterol, Total: 185 mg/dL (ref 100–199)
HDL: 61 mg/dL (ref >39)
LDL Chol Calc (NIH): 106 mg/dL — ABNORMAL HIGH (ref 0–99)
Triglycerides: 100 mg/dL (ref 0–149)
VLDL Cholesterol Cal: 18 mg/dL (ref 5–40)

## 2024-09-03 LAB — CYTOLOGY - PAP
Comment: NEGATIVE
Diagnosis: NEGATIVE
High risk HPV: NEGATIVE

## 2024-09-03 LAB — VITAMIN D 25 HYDROXY (VIT D DEFICIENCY, FRACTURES): Vit D, 25-Hydroxy: 31.8 ng/mL (ref 30.0–100.0)

## 2024-09-03 NOTE — Telephone Encounter (Signed)
 Requested Prescriptions  Pending Prescriptions Disp Refills   metFORMIN  (GLUCOPHAGE ) 500 MG tablet [Pharmacy Med Name: METFORMIN  500MG  TABLETS] 90 tablet 1    Sig: TAKE 1 TABLET(500 MG) BY MOUTH DAILY WITH BREAKFAST     Endocrinology:  Diabetes - Biguanides Failed - 09/03/2024  5:05 PM      Failed - B12 Level in normal range and within 720 days    No results found for: VITAMINB12       Passed - Cr in normal range and within 360 days    Creat  Date Value Ref Range Status  08/12/2021 1.30 (H) 0.50 - 1.03 mg/dL Final   Creatinine, Ser  Date Value Ref Range Status  09/02/2024 0.83 0.57 - 1.00 mg/dL Final         Passed - HBA1C is between 0 and 7.9 and within 180 days    Hgb A1c MFr Bld  Date Value Ref Range Status  09/02/2024 6.2 (H) 4.8 - 5.6 % Final    Comment:             Prediabetes: 5.7 - 6.4          Diabetes: >6.4          Glycemic control for adults with diabetes: <7.0          Passed - eGFR in normal range and within 360 days    GFR, Est African American  Date Value Ref Range Status  02/09/2021 103 > OR = 60 mL/min/1.52m2 Final   GFR, Est Non African American  Date Value Ref Range Status  02/09/2021 88 > OR = 60 mL/min/1.36m2 Final   eGFR  Date Value Ref Range Status  09/02/2024 80 >59 mL/min/1.73 Final         Passed - Valid encounter within last 6 months    Recent Outpatient Visits           Yesterday Annual physical exam   [facility] Bernardo Fend, DO   1 month ago Wheezing   [facility]end, DO   2 months ago Wheezing   [facility] Bern[facility], [facility]   6 months ago Prediabetes   [facility] [place]end, [place]              Passed - CBC within normal limits and completed in the last 12 months    WBC  Date Value Ref Range Status  09/02/2024 6.4 3.4 - 10.8 x10E3/uL Final  02/09/2021 6.2 3.8 - 10.8 Thousand/uL  Final   RBC  Date Value Ref Range Status  09/02/2024 4.92 3.77 - 5.28 x10E6/uL Final  02/09/2021 4.45 3.80 - 5.10 Million/uL Final   Hemoglobin  Date Value Ref Range Status  09/02/2024 13.5 11.1 - 15.9 g/dL Final   Hematocrit  Date Value Ref Range Status  09/02/2024 42.2 34.0 - 46.6 % Final   MCHC  Date Value Ref Range Status  09/02/2024 32.0 31.5 - 35.7 g/dL Final  94/82/7977 67.3 32.0 - 36.0 g/dL Final   [facility]  Date Value Ref Range Status  09/02/2024 27.4 26.6 - 33.0 pg Final  02/09/2021 28.5 27.0 - 33.0 pg Final   MCV  Date Value Ref Range Status  09/02/2024 86 79 - 97 fL Final   No results found for: PLTCOUNTKUC, LABPLAT, POCPLA RDW  Date Value Ref Range Status  09/02/2024 13.8 11.7 - 15.4 % Final

## 2024-09-03 NOTE — Telephone Encounter (Signed)
 Requested Prescriptions  Pending Prescriptions Disp Refills   fluticasone  (FLONASE ) 50 MCG/ACT nasal spray [Pharmacy Med Name: FLUTICASONE  NASAL SP (120) RX] 48 g 0    Sig: SHAKE LIQUID AND USE 2 SPRAYS IN EACH NOSTRIL DAILY     Ear, Nose, and Throat: Nasal Preparations - Corticosteroids Passed - 09/03/2024  4:20 PM      Passed - Valid encounter within last 12 months    Recent Outpatient Visits           Yesterday Annual physical exam   [facility] Bernardo Fend, DO   1 month ago Wheezing   [facility], DO   2 months ago Wheezing   [facility], [facility]   6 months ago Prediabetes   [facility] Bernardo Fend, [place]              Refused Prescriptions Disp Refills   omeprazole  (PRILOSEC) 20 MG capsule [Pharmacy Med Name: OMEPRAZOLE  20MG  CAPSULES] 90 capsule 1    Sig: TAKE 1 CAPSULE(20 MG) BY MOUTH DAILY     Gastroenterology: Proton Pump Inhibitors Passed - 09/03/2024  4:20 PM      Passed - Valid encounter within last 12 months    Recent Outpatient Visits           Yesterday Annual physical exam   [facility] Bern[facility]end, DO   1 month ago Wheezing   [facility], DO   2 months ago Wheezing   [facility]end, DO   6 months ago Prediabetes   [facility] Bernardo Fend, [place]

## 2024-09-05 DIAGNOSIS — G4733 Obstructive sleep apnea (adult) (pediatric): Secondary | ICD-10-CM | POA: Diagnosis not present

## 2024-09-21 ENCOUNTER — Other Ambulatory Visit: Payer: Self-pay | Admitting: Internal Medicine

## 2024-09-21 DIAGNOSIS — R062 Wheezing: Secondary | ICD-10-CM

## 2024-09-23 NOTE — Telephone Encounter (Signed)
 Requested Prescriptions  Pending Prescriptions Disp Refills   albuterol  (VENTOLIN  HFA) 108 (90 Base) MCG/ACT inhaler [Pharmacy Med Name: ALBUTEROL  HFA INH (200 PUFFS) 6.7GM] 6.7 g 0    Sig: INHALE 2 PUFFS BY MOUTH EVERY 6 HOURS AS NEEDED FOR WHEEZING OR SHORTNESS OF BREATH     Pulmonology:  Beta Agonists 2 Passed - 09/23/2024  4:21 PM      Passed - Last BP in normal range    BP Readings from Last 1 Encounters:  09/02/24 120/70         Passed - Last Heart Rate in normal range    Pulse Readings from Last 1 Encounters:  09/02/24 86         Passed - Valid encounter within last 12 months    Recent Outpatient Visits           3 weeks ago Annual physical exam   [facility] Bern[facility], DO   2 months ago Wheezing   [facility] Bern[facility]end, DO   3 months ago Wheezing   [facility] Bernardo Fend, DO   6 months ago Prediabetes   [facility] Bernardo Fend, [place]

## 2024-10-03 ENCOUNTER — Other Ambulatory Visit: Payer: Self-pay | Admitting: Internal Medicine

## 2024-10-03 DIAGNOSIS — I10 Essential (primary) hypertension: Secondary | ICD-10-CM

## 2024-10-03 DIAGNOSIS — E782 Mixed hyperlipidemia: Secondary | ICD-10-CM

## 2024-10-03 NOTE — Telephone Encounter (Signed)
 Requested Prescriptions  Pending Prescriptions Disp Refills   fluticasone  (FLONASE ) 50 MCG/ACT nasal spray [Pharmacy Med Name: FLUTICASONE  NASAL SP (120) RX] 48 g 0    Sig: SHAKE LIQUID AND USE 2 SPRAYS IN EACH NOSTRIL EVERY DAY     Ear, Nose, and Throat: Nasal Preparations - Corticosteroids Passed - 10/03/2024  4:08 PM      Passed - Valid encounter within last 12 months    Recent Outpatient Visits           1 month ago Annual physical exam   [facility] Bernardo Fend, DO   2 months ago Wheezing   [facility], DO   3 months ago Wheezing   [facility], DO   7 months ago Prediabetes   [facility]end, DO               olmesartan -hydrochlorothiazide (BENICAR  HCT) 40-25 MG tablet [Pharmacy Med Name: OLMESARTAN  MEDOX/HCTZ 40-25MG  TAB] 90 tablet 1    Sig: TAKE 1 TABLET BY MOUTH DAILY     Cardiovascular: ARB + Diuretic Combos Passed - 10/03/2024  4:08 PM      Passed - K in normal range and within 180 days    Potassium  Date Value Ref Range Status  09/02/2024 4.0 3.5 - 5.2 mmol/L Final         Passed - Na in normal range and within 180 days    Sodium  Date Value Ref Range Status  09/02/2024 140 134 - 144 mmol/L Final         Passed - Cr in normal range and within 180 days    Creat  Date Value Ref Range Status  08/12/2021 1.30 (H) 0.50 - 1.03 mg/dL Final   Creatinine, Ser  Date Value Ref Range Status  09/02/2024 0.83 0.57 - 1.00 mg/dL Final         Passed - eGFR is 10 or above and within 180 days    GFR, Est African American  Date Value Ref Range Status  02/09/2021 103 > OR = 60 mL/min/1.21m2 Final   GFR, Est Non African American  Date Value Ref Range Status  02/09/2021 88 > OR = 60 mL/min/1.45m2 Final   eGFR  Date Value Ref Range Status  09/02/2024 80 >59 mL/min/1.73 Final         Passed -  Patient is not pregnant      Passed - Last BP in normal range    BP Readings from Last 1 Encounters:  09/02/24 120/70         Passed - Valid encounter within last 6 months    Recent Outpatient Visits           1 month ago Annual physical exam   [facility] Bern[facility]end, DO   2 months ago Wheezing   [facility], DO   3 months ago Wheezing   [facility] Bernardo Fend, DO   7 months ago Prediabetes   [facility] Bernardo Fend, DO               simvastatin  (ZOCOR ) 20 MG tablet [Pharmacy Med Name: SIMVASTATIN  20MG  TABLETS] 90 tablet 1    Sig: TAKE 1 TABLET(20 MG) BY MOUTH DAILY AT 6 PM     Cardiovascular:  Antilipid - Statins Failed - 10/03/2024  4:08 PM      Failed - Lipid Panel in normal range within the last 12 months    Cholesterol, Total  Date Value Ref Range Status  09/02/2024 185 100 - 199 mg/dL Final   LDL Cholesterol (Calc)  Date Value Ref Range Status  08/12/2021 79 mg/dL (calc) Final    Comment:    Reference range: <100 . Desirable range <100 mg/dL for primary prevention;   <70 mg/dL for patients with CHD or diabetic patients  with > or = 2 CHD risk factors. SABRA LDL-C is now calculated using the Martin-Hopkins  calculation, which is a validated novel method providing  better accuracy than the Friedewald equation in the  estimation of LDL-C.  Gladis APPLETHWAITE et al. SANDREA. 7986;689(80): 2061-2068  (http://education.[facility].com/faq/FAQ164)    LDL Chol Calc (NIH)  Date Value Ref Range Status  09/02/2024 106 (H) 0 - 99 mg/dL Final   HDL  Date Value Ref Range Status  09/02/2024 61 >39 mg/dL Final   Triglycerides  Date Value Ref Range Status  09/02/2024 100 0 - 149 mg/dL Final         Passed - Patient is not pregnant      Passed - Valid encounter within last 12 months    Recent Outpatient Visits            1 month ago Annual physical exam   [facility], [facility]   2 months ago Wheezing   [facility] Bern[facility] Fend, DO   3 months ago Wheezing   [facility] Bernardo Fend, DO   7 months ago Prediabetes   [facility] Bernardo Fend, [place]

## 2024-10-21 ENCOUNTER — Other Ambulatory Visit: Payer: Self-pay | Admitting: Internal Medicine

## 2024-10-21 DIAGNOSIS — R062 Wheezing: Secondary | ICD-10-CM

## 2024-10-22 NOTE — Telephone Encounter (Signed)
 Requested Prescriptions  Pending Prescriptions Disp Refills   albuterol  (VENTOLIN  HFA) 108 (90 Base) MCG/ACT inhaler [Pharmacy Med Name: ALBUTEROL  HFA INH (200 PUFFS) 6.7GM] 6.7 g 0    Sig: INHALE 2 PUFFS BY MOUTH EVERY 6 HOURS AS NEEDED FOR WHEEZING OR SHORTNESS OF BREATH     Pulmonology:  [facility] 2 Passed - 10/22/2024 11:17 AM      Passed - Last BP in normal range    BP Readings from Last 1 Encounters:  09/02/24 120/70         Passed - Last Heart Rate in normal range    Pulse Readings from Last 1 Encounters:  09/02/24 86         Passed - Valid encounter within last 12 months    Recent Outpatient Visits           1 month ago Annual physical exam   [facility] Bernardo Fend, DO   3 months ago Wheezing   [facility] Bernardo Fend, DO   4 months ago Wheezing   [facility] Bernardo Fend, DO   7 months ago Prediabetes   [facility] Bernardo Fend, [place]

## 2025-03-03 ENCOUNTER — Ambulatory Visit: Admitting: Internal Medicine
